# Patient Record
Sex: Female | Born: 1940 | Race: White | Hispanic: No | Smoking: Former smoker
Health system: Southern US, Community
[De-identification: ages and names within clinical notes are randomized; demographics above are authoritative.]

## PROBLEM LIST (undated history)

## (undated) DIAGNOSIS — Z8781 Personal history of (healed) traumatic fracture: Secondary | ICD-10-CM

## (undated) DIAGNOSIS — K219 Gastro-esophageal reflux disease without esophagitis: Secondary | ICD-10-CM

## (undated) DIAGNOSIS — E119 Type 2 diabetes mellitus without complications: Secondary | ICD-10-CM

## (undated) DIAGNOSIS — E785 Hyperlipidemia, unspecified: Secondary | ICD-10-CM

## (undated) DIAGNOSIS — I10 Essential (primary) hypertension: Secondary | ICD-10-CM

## (undated) DIAGNOSIS — S270XXA Traumatic pneumothorax, initial encounter: Secondary | ICD-10-CM

## (undated) HISTORY — DX: Hyperlipidemia, unspecified: E78.5

## (undated) HISTORY — DX: Gastro-esophageal reflux disease without esophagitis: K21.9

## (undated) HISTORY — PX: TONSILLECTOMY: SUR1361

## (undated) HISTORY — DX: Essential (primary) hypertension: I10

## (undated) HISTORY — PX: COLONOSCOPY: SHX174

## (undated) HISTORY — DX: Type 2 diabetes mellitus without complications: E11.9

## (undated) HISTORY — DX: Traumatic pneumothorax, initial encounter: S27.0XXA

---

## 1991-12-31 DIAGNOSIS — S270XXA Traumatic pneumothorax, initial encounter: Secondary | ICD-10-CM

## 1991-12-31 HISTORY — DX: Traumatic pneumothorax, initial encounter: S27.0XXA

## 1998-07-31 ENCOUNTER — Other Ambulatory Visit: Admission: RE | Admit: 1998-07-31 | Discharge: 1998-07-31 | Payer: Self-pay | Admitting: Obstetrics and Gynecology

## 1999-02-24 ENCOUNTER — Emergency Department (HOSPITAL_COMMUNITY): Admission: EM | Admit: 1999-02-24 | Discharge: 1999-02-24 | Payer: Self-pay

## 1999-08-08 ENCOUNTER — Other Ambulatory Visit: Admission: RE | Admit: 1999-08-08 | Discharge: 1999-08-08 | Payer: Self-pay | Admitting: Obstetrics and Gynecology

## 2000-09-19 ENCOUNTER — Other Ambulatory Visit: Admission: RE | Admit: 2000-09-19 | Discharge: 2000-09-19 | Payer: Self-pay | Admitting: Obstetrics and Gynecology

## 2000-12-08 ENCOUNTER — Other Ambulatory Visit: Admission: RE | Admit: 2000-12-08 | Discharge: 2000-12-08 | Payer: Self-pay | Admitting: Obstetrics and Gynecology

## 2000-12-08 ENCOUNTER — Encounter (INDEPENDENT_AMBULATORY_CARE_PROVIDER_SITE_OTHER): Payer: Self-pay | Admitting: Specialist

## 2001-10-07 ENCOUNTER — Other Ambulatory Visit: Admission: RE | Admit: 2001-10-07 | Discharge: 2001-10-07 | Payer: Self-pay | Admitting: Obstetrics and Gynecology

## 2002-12-29 ENCOUNTER — Other Ambulatory Visit: Admission: RE | Admit: 2002-12-29 | Discharge: 2002-12-29 | Payer: Self-pay | Admitting: Obstetrics and Gynecology

## 2003-05-27 ENCOUNTER — Emergency Department (HOSPITAL_COMMUNITY): Admission: EM | Admit: 2003-05-27 | Discharge: 2003-05-27 | Payer: Self-pay | Admitting: Emergency Medicine

## 2003-05-30 ENCOUNTER — Emergency Department (HOSPITAL_COMMUNITY): Admission: EM | Admit: 2003-05-30 | Discharge: 2003-05-30 | Payer: Self-pay | Admitting: Emergency Medicine

## 2003-06-03 ENCOUNTER — Encounter (HOSPITAL_COMMUNITY): Admission: RE | Admit: 2003-06-03 | Discharge: 2003-09-01 | Payer: Self-pay | Admitting: Emergency Medicine

## 2004-01-31 ENCOUNTER — Other Ambulatory Visit: Admission: RE | Admit: 2004-01-31 | Discharge: 2004-01-31 | Payer: Self-pay | Admitting: Obstetrics and Gynecology

## 2004-11-14 ENCOUNTER — Ambulatory Visit: Payer: Self-pay | Admitting: Internal Medicine

## 2005-04-25 ENCOUNTER — Other Ambulatory Visit: Admission: RE | Admit: 2005-04-25 | Discharge: 2005-04-25 | Payer: Self-pay | Admitting: Obstetrics and Gynecology

## 2005-05-03 ENCOUNTER — Ambulatory Visit: Payer: Self-pay | Admitting: Internal Medicine

## 2005-07-10 ENCOUNTER — Ambulatory Visit: Payer: Self-pay | Admitting: Internal Medicine

## 2005-07-30 ENCOUNTER — Ambulatory Visit: Payer: Self-pay | Admitting: Internal Medicine

## 2005-10-04 ENCOUNTER — Ambulatory Visit: Payer: Self-pay | Admitting: Family Medicine

## 2006-04-14 ENCOUNTER — Ambulatory Visit: Payer: Self-pay | Admitting: Internal Medicine

## 2006-05-08 ENCOUNTER — Ambulatory Visit: Payer: Self-pay | Admitting: Internal Medicine

## 2006-08-07 ENCOUNTER — Ambulatory Visit: Payer: Self-pay | Admitting: Internal Medicine

## 2006-08-13 ENCOUNTER — Ambulatory Visit: Payer: Self-pay | Admitting: Internal Medicine

## 2006-09-23 ENCOUNTER — Ambulatory Visit: Payer: Self-pay | Admitting: Internal Medicine

## 2006-10-08 ENCOUNTER — Ambulatory Visit: Payer: Self-pay | Admitting: Internal Medicine

## 2006-11-05 ENCOUNTER — Ambulatory Visit: Payer: Self-pay | Admitting: Internal Medicine

## 2007-01-30 ENCOUNTER — Ambulatory Visit: Payer: Self-pay | Admitting: Pulmonary Disease

## 2007-02-28 HISTORY — PX: DILATION AND CURETTAGE OF UTERUS: SHX78

## 2007-03-19 ENCOUNTER — Ambulatory Visit: Payer: Self-pay | Admitting: Internal Medicine

## 2007-03-25 ENCOUNTER — Encounter (INDEPENDENT_AMBULATORY_CARE_PROVIDER_SITE_OTHER): Payer: Self-pay | Admitting: *Deleted

## 2007-03-25 ENCOUNTER — Ambulatory Visit (HOSPITAL_COMMUNITY): Admission: RE | Admit: 2007-03-25 | Discharge: 2007-03-25 | Payer: Self-pay | Admitting: Obstetrics and Gynecology

## 2007-05-27 DIAGNOSIS — J3089 Other allergic rhinitis: Secondary | ICD-10-CM

## 2007-05-27 DIAGNOSIS — R7303 Prediabetes: Secondary | ICD-10-CM | POA: Insufficient documentation

## 2007-05-27 DIAGNOSIS — J302 Other seasonal allergic rhinitis: Secondary | ICD-10-CM

## 2007-05-27 DIAGNOSIS — J4520 Mild intermittent asthma, uncomplicated: Secondary | ICD-10-CM

## 2007-06-25 ENCOUNTER — Ambulatory Visit: Payer: Self-pay | Admitting: Internal Medicine

## 2007-06-25 DIAGNOSIS — K219 Gastro-esophageal reflux disease without esophagitis: Secondary | ICD-10-CM

## 2007-07-02 ENCOUNTER — Encounter (INDEPENDENT_AMBULATORY_CARE_PROVIDER_SITE_OTHER): Payer: Self-pay | Admitting: *Deleted

## 2007-08-29 ENCOUNTER — Observation Stay (HOSPITAL_COMMUNITY): Admission: EM | Admit: 2007-08-29 | Discharge: 2007-09-01 | Payer: Self-pay | Admitting: Emergency Medicine

## 2007-09-01 ENCOUNTER — Ambulatory Visit: Payer: Self-pay | Admitting: Physical Medicine & Rehabilitation

## 2007-09-14 ENCOUNTER — Ambulatory Visit: Payer: Self-pay | Admitting: Internal Medicine

## 2007-09-28 ENCOUNTER — Encounter: Payer: Self-pay | Admitting: Cardiology

## 2007-10-19 ENCOUNTER — Telehealth: Payer: Self-pay | Admitting: Internal Medicine

## 2007-10-29 ENCOUNTER — Ambulatory Visit: Payer: Self-pay | Admitting: Internal Medicine

## 2007-10-29 LAB — CONVERTED CEMR LAB
Hgb A1c MFr Bld: 5.7 % (ref 4.6–6.0)
Total CHOL/HDL Ratio: 3.4
VLDL: 36 mg/dL (ref 0–40)

## 2007-11-02 ENCOUNTER — Ambulatory Visit: Payer: Self-pay | Admitting: Internal Medicine

## 2007-11-02 DIAGNOSIS — E785 Hyperlipidemia, unspecified: Secondary | ICD-10-CM | POA: Insufficient documentation

## 2007-11-02 LAB — CONVERTED CEMR LAB: Cholesterol, target level: 200 mg/dL

## 2007-11-03 ENCOUNTER — Ambulatory Visit: Payer: Self-pay | Admitting: Internal Medicine

## 2008-03-07 ENCOUNTER — Encounter (INDEPENDENT_AMBULATORY_CARE_PROVIDER_SITE_OTHER): Payer: Self-pay | Admitting: *Deleted

## 2008-03-07 ENCOUNTER — Ambulatory Visit: Payer: Self-pay | Admitting: Internal Medicine

## 2008-03-18 ENCOUNTER — Telehealth (INDEPENDENT_AMBULATORY_CARE_PROVIDER_SITE_OTHER): Payer: Self-pay | Admitting: *Deleted

## 2008-03-21 ENCOUNTER — Telehealth (INDEPENDENT_AMBULATORY_CARE_PROVIDER_SITE_OTHER): Payer: Self-pay | Admitting: *Deleted

## 2008-04-12 ENCOUNTER — Ambulatory Visit: Payer: Self-pay | Admitting: Gastroenterology

## 2008-04-27 ENCOUNTER — Ambulatory Visit: Payer: Self-pay | Admitting: Gastroenterology

## 2008-04-27 ENCOUNTER — Encounter: Payer: Self-pay | Admitting: Internal Medicine

## 2008-04-27 ENCOUNTER — Encounter: Payer: Self-pay | Admitting: Gastroenterology

## 2008-05-18 ENCOUNTER — Ambulatory Visit: Payer: Self-pay | Admitting: Internal Medicine

## 2008-06-15 ENCOUNTER — Ambulatory Visit: Payer: Self-pay | Admitting: Internal Medicine

## 2008-06-15 LAB — CONVERTED CEMR LAB
ALT: 18 units/L (ref 0–35)
Albumin: 3.6 g/dL (ref 3.5–5.2)
BUN: 24 mg/dL — ABNORMAL HIGH (ref 6–23)
Basophils Relative: 0 % (ref 0.0–1.0)
CO2: 27 meq/L (ref 19–32)
Calcium: 9.3 mg/dL (ref 8.4–10.5)
Cholesterol: 177 mg/dL (ref 0–200)
Eosinophils Relative: 4 % (ref 0.0–5.0)
GFR calc Af Amer: 81 mL/min
Glucose, Bld: 99 mg/dL (ref 70–99)
HCT: 40.9 % (ref 36.0–46.0)
Hemoglobin: 14 g/dL (ref 12.0–15.0)
Monocytes Absolute: 0.5 10*3/uL (ref 0.1–1.0)
Monocytes Relative: 7.7 % (ref 3.0–12.0)
Neutro Abs: 3.9 10*3/uL (ref 1.4–7.7)
RBC: 4.52 M/uL (ref 3.87–5.11)
TSH: 6.72 microintl units/mL — ABNORMAL HIGH (ref 0.35–5.50)
Total CHOL/HDL Ratio: 3.4
Total Protein: 6.3 g/dL (ref 6.0–8.3)
WBC: 6.2 10*3/uL (ref 4.5–10.5)

## 2008-06-24 ENCOUNTER — Ambulatory Visit: Payer: Self-pay | Admitting: Internal Medicine

## 2008-06-24 DIAGNOSIS — I10 Essential (primary) hypertension: Secondary | ICD-10-CM

## 2008-06-24 DIAGNOSIS — E039 Hypothyroidism, unspecified: Secondary | ICD-10-CM

## 2008-07-07 ENCOUNTER — Telehealth (INDEPENDENT_AMBULATORY_CARE_PROVIDER_SITE_OTHER): Payer: Self-pay | Admitting: *Deleted

## 2008-09-26 ENCOUNTER — Ambulatory Visit: Payer: Self-pay | Admitting: Internal Medicine

## 2008-10-03 ENCOUNTER — Ambulatory Visit: Payer: Self-pay | Admitting: Internal Medicine

## 2008-12-08 ENCOUNTER — Encounter: Payer: Self-pay | Admitting: Internal Medicine

## 2009-01-03 ENCOUNTER — Ambulatory Visit: Payer: Self-pay | Admitting: Internal Medicine

## 2009-01-08 LAB — CONVERTED CEMR LAB: TSH: 1.35 microintl units/mL (ref 0.35–5.50)

## 2009-01-09 ENCOUNTER — Encounter (INDEPENDENT_AMBULATORY_CARE_PROVIDER_SITE_OTHER): Payer: Self-pay | Admitting: *Deleted

## 2009-01-10 ENCOUNTER — Ambulatory Visit: Payer: Self-pay | Admitting: Internal Medicine

## 2009-01-12 ENCOUNTER — Ambulatory Visit: Payer: Self-pay | Admitting: Internal Medicine

## 2009-01-12 DIAGNOSIS — L501 Idiopathic urticaria: Secondary | ICD-10-CM

## 2009-03-08 ENCOUNTER — Telehealth: Payer: Self-pay | Admitting: Internal Medicine

## 2009-07-05 ENCOUNTER — Ambulatory Visit: Payer: Self-pay | Admitting: Internal Medicine

## 2009-07-11 ENCOUNTER — Encounter (INDEPENDENT_AMBULATORY_CARE_PROVIDER_SITE_OTHER): Payer: Self-pay | Admitting: *Deleted

## 2009-07-13 ENCOUNTER — Ambulatory Visit: Payer: Self-pay | Admitting: Internal Medicine

## 2009-07-14 ENCOUNTER — Encounter: Payer: Self-pay | Admitting: Internal Medicine

## 2009-07-18 ENCOUNTER — Ambulatory Visit: Payer: Self-pay | Admitting: Internal Medicine

## 2009-07-23 LAB — CONVERTED CEMR LAB
ALT: 22 units/L (ref 0–35)
AST: 23 units/L (ref 0–37)
Alkaline Phosphatase: 69 units/L (ref 39–117)
BUN: 19 mg/dL (ref 6–23)
Basophils Relative: 0.4 % (ref 0.0–3.0)
Chloride: 107 meq/L (ref 96–112)
Cholesterol: 186 mg/dL (ref 0–200)
Eosinophils Relative: 2.9 % (ref 0.0–5.0)
GFR calc non Af Amer: 59.02 mL/min (ref >60)
Glucose, Bld: 91 mg/dL (ref 70–99)
Hemoglobin: 14 g/dL (ref 12.0–15.0)
LDL Cholesterol: 87 mg/dL (ref 0–99)
Lymphocytes Relative: 24.8 % (ref 12.0–46.0)
MCHC: 34 g/dL (ref 30.0–36.0)
Monocytes Relative: 6.3 % (ref 3.0–12.0)
Neutro Abs: 4.4 10*3/uL (ref 1.4–7.7)
Neutrophils Relative %: 65.6 % (ref 43.0–77.0)
Potassium: 3.7 meq/L (ref 3.5–5.1)
RBC: 4.57 M/uL (ref 3.87–5.11)
Sodium: 143 meq/L (ref 135–145)
Total Bilirubin: 0.7 mg/dL (ref 0.3–1.2)
VLDL: 38.8 mg/dL (ref 0.0–40.0)
WBC: 6.7 10*3/uL (ref 4.5–10.5)

## 2009-07-24 ENCOUNTER — Encounter (INDEPENDENT_AMBULATORY_CARE_PROVIDER_SITE_OTHER): Payer: Self-pay | Admitting: *Deleted

## 2009-10-24 ENCOUNTER — Ambulatory Visit: Payer: Self-pay | Admitting: Internal Medicine

## 2009-12-11 ENCOUNTER — Encounter: Payer: Self-pay | Admitting: Internal Medicine

## 2010-01-04 ENCOUNTER — Ambulatory Visit: Payer: Self-pay | Admitting: Internal Medicine

## 2010-01-04 ENCOUNTER — Encounter (INDEPENDENT_AMBULATORY_CARE_PROVIDER_SITE_OTHER): Payer: Self-pay | Admitting: *Deleted

## 2010-01-04 LAB — CONVERTED CEMR LAB
OCCULT 1: NEGATIVE
OCCULT 2: NEGATIVE
OCCULT 3: NEGATIVE

## 2010-01-11 ENCOUNTER — Ambulatory Visit: Payer: Self-pay | Admitting: Internal Medicine

## 2010-04-24 ENCOUNTER — Ambulatory Visit: Payer: Self-pay | Admitting: Internal Medicine

## 2010-07-19 ENCOUNTER — Ambulatory Visit: Payer: Self-pay | Admitting: Internal Medicine

## 2010-09-21 ENCOUNTER — Ambulatory Visit: Payer: Self-pay | Admitting: Internal Medicine

## 2010-10-01 ENCOUNTER — Telehealth (INDEPENDENT_AMBULATORY_CARE_PROVIDER_SITE_OTHER): Payer: Self-pay | Admitting: *Deleted

## 2010-12-12 ENCOUNTER — Encounter: Payer: Self-pay | Admitting: Internal Medicine

## 2011-01-07 ENCOUNTER — Telehealth: Payer: Self-pay | Admitting: Internal Medicine

## 2011-01-09 ENCOUNTER — Encounter: Payer: Self-pay | Admitting: Internal Medicine

## 2011-01-10 ENCOUNTER — Ambulatory Visit
Admission: RE | Admit: 2011-01-10 | Discharge: 2011-01-10 | Payer: Self-pay | Source: Home / Self Care | Attending: Internal Medicine | Admitting: Internal Medicine

## 2011-01-14 ENCOUNTER — Ambulatory Visit
Admission: RE | Admit: 2011-01-14 | Discharge: 2011-01-14 | Payer: Self-pay | Source: Home / Self Care | Attending: Internal Medicine | Admitting: Internal Medicine

## 2011-01-14 LAB — CONVERTED CEMR LAB
OCCULT 1: NEGATIVE
OCCULT 2: NEGATIVE
OCCULT 3: NEGATIVE

## 2011-01-15 ENCOUNTER — Encounter (INDEPENDENT_AMBULATORY_CARE_PROVIDER_SITE_OTHER): Payer: Self-pay | Admitting: *Deleted

## 2011-01-18 ENCOUNTER — Ambulatory Visit: Admit: 2011-01-18 | Payer: Self-pay | Admitting: Internal Medicine

## 2011-01-27 LAB — CONVERTED CEMR LAB
ALT: 26 units/L (ref 0–35)
Albumin: 4.1 g/dL (ref 3.5–5.2)
Alkaline Phosphatase: 79 units/L (ref 39–117)
BUN: 21 mg/dL (ref 6–23)
Basophils Absolute: 0.1 10*3/uL (ref 0.0–0.1)
Basophils Relative: 0.1 % (ref 0.0–1.0)
Bilirubin, Direct: 0.1 mg/dL (ref 0.0–0.3)
CO2: 29 meq/L (ref 19–32)
CO2: 29 meq/L (ref 19–32)
Calcium: 9.6 mg/dL (ref 8.4–10.5)
Calcium: 9.9 mg/dL (ref 8.4–10.5)
Cholesterol, target level: 200 mg/dL
Cholesterol: 215 mg/dL — ABNORMAL HIGH (ref 0–200)
Eosinophils Absolute: 0.2 10*3/uL (ref 0.0–0.7)
Eosinophils Relative: 5 % (ref 0.0–5.0)
GFR calc Af Amer: 81 mL/min
GFR calc non Af Amer: 61.67 mL/min (ref >60)
Glucose, Bld: 106 mg/dL — ABNORMAL HIGH (ref 70–99)
Glucose, Bld: 95 mg/dL (ref 70–99)
HCT: 40.9 % (ref 36.0–46.0)
HDL: 60.5 mg/dL (ref >39.00)
Hemoglobin: 13.7 g/dL (ref 12.0–15.0)
Hemoglobin: 14.5 g/dL (ref 12.0–15.0)
Lymphocytes Relative: 18.3 % (ref 12.0–46.0)
Lymphocytes Relative: 22.2 % (ref 12.0–46.0)
Lymphs Abs: 1.3 10*3/uL (ref 0.7–4.0)
MCHC: 33.7 g/dL (ref 30.0–36.0)
Monocytes Absolute: 0.5 10*3/uL (ref 0.2–0.7)
Monocytes Relative: 8.2 % (ref 3.0–12.0)
Neutro Abs: 3.8 10*3/uL (ref 1.4–7.7)
Neutro Abs: 4.9 10*3/uL (ref 1.4–7.7)
Platelets: 390 10*3/uL (ref 150.0–400.0)
Potassium: 4.4 meq/L (ref 3.5–5.1)
RDW: 13.5 % (ref 11.5–14.6)
Sodium: 145 meq/L (ref 135–145)
TSH: 0.6 microintl units/mL (ref 0.35–5.50)
TSH: 3.52 microintl units/mL (ref 0.35–5.50)
Total Bilirubin: 0.5 mg/dL (ref 0.3–1.2)
Total Protein: 6.6 g/dL (ref 6.0–8.3)
Triglycerides: 260 mg/dL — ABNORMAL HIGH (ref 0.0–149.0)
VLDL: 49 mg/dL — ABNORMAL HIGH (ref 0–40)
WBC: 7.1 10*3/uL (ref 4.5–10.5)

## 2011-01-31 NOTE — Letter (Signed)
Summary: Results Follow up Letter  [facility]  [address] [place], [place] [address]   Phone: [phone]  Fax: [phone]    01/04/2010 MRN: [id number]  Natalie Shannon [address] [place], [place]  [address]  Dear Ms. Dukes,  The following are the results of your recent test(s):  Test         Result    Pap Smear:        Normal _____  Not Normal _____ Comments: ______________________________________________________ Cholesterol: LDL(Bad cholesterol):         Your goal is less than:         HDL (Good cholesterol):       Your goal is more than: Comments:  ______________________________________________________ Mammogram:        Normal _____  Not Normal _____ Comments:  ___________________________________________________________________ Hemoccult:        Normal __X___  Not normal _______ Comments:    _____________________________________________________________________ Other Tests:    We routinely do not discuss normal results over the telephone.  If you desire a copy of the results, or you have any questions about this information we can discuss them at your next office visit.   Sincerely,

## 2011-01-31 NOTE — Progress Notes (Signed)
Summary: prilosec and singulair refill   Phone Note Refill Request Message from:  Fax from Pharmacy on October 01, 2010 1:45 PM  Refills Requested: Medication #1:  PRILOSEC 20 MG  CPDR 1 by mouth once daily  Medication #2:  SINGULAIR 10 MG  TABS 1 by mouth qd [facility] - fax [phone]  Initial call taken by: Okey Regal [place],  October 01, 2010 1:46 PM    Prescriptions: SINGULAIR 10 MG  TABS (MONTELUKAST SODIUM) 1 by mouth qd  #90 x 3   Entered by:   Doristine Devoid CMA   Authorized by:   Marga Melnick MD   Signed by:   Doristine Devoid CMA on 10/02/2010   Method used:   Electronically to        [facility] (retail)       [address]       [place], [place]  [address]       Ph: [phone]       Fax: [phone]   RxID:   [id number] PRILOSEC 20 MG  CPDR (OMEPRAZOLE) 1 by mouth once daily  #90 x 3   Entered by:   Doristine Devoid CMA   Authorized by:   Marga Melnick MD   Signed by:   Doristine Devoid CMA on 10/02/2010   Method used:   Electronically to        [facility] (retail)       [address]       [place], [place]  [address]       Ph: [phone]       Fax: [phone]   RxID:   [id number]
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
## 2011-01-31 NOTE — Assessment & Plan Note (Signed)
Summary: flu shot/cbs  Flu Vaccine Consent Questions     Do you have a history of severe allergic reactions to this vaccine? no    Any prior history of allergic reactions to egg and/or gelatin? no    Do you have a sensitivity to the preservative Thimersol? no    Do you have a past history of Guillan-Barre Syndrome? no    Do you currently have an acute febrile illness? no    Have you ever had a severe reaction to latex? no    Vaccine information given and explained to patient? yes    Are you currently pregnant? no    Lot Number:[id number]   Exp Date:06/29/2011   Manufacturer: [employer]    Site Given  Left Deltoid IM  Nurse Visit   Allergies: 1)  ! Sulfa 2)  ! * Latex 3)  ! * Adhesive Tape 4)  ! Betadine  Orders Added: 1)  Flu Vaccine 85yrs + MEDICARE PATIENTS [Q2039] 2)  Administration Flu vaccine - MCR [G0008]
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
## 2011-01-31 NOTE — Letter (Signed)
Summary: Results Follow up Letter  [facility] [place]  [address] [place], [place] [address]   Phone: [phone]  Fax: [phone]    01/15/2011 MRN: [id number]  Natalie Shannon [address] [place], [place]  [address]  Dear [name],  The following are the results of your recent test(s):  Test         Result    Pap Smear:        Normal _____  Not Normal _____ Comments: ______________________________________________________ Cholesterol: LDL(Bad cholesterol):         Your goal is less than:         HDL (Good cholesterol):       Your goal is more than: Comments:  ______________________________________________________ Mammogram:        Normal _____  Not Normal _____ Comments:  ___________________________________________________________________ Hemoccult:        Normal _X____  Not normal _______ Comments:    _____________________________________________________________________ Other Tests:    We routinely do not discuss normal results over the telephone.  If you desire a copy of the results, or you have any questions about this information we can discuss them at your next office visit.   Sincerely,

## 2011-01-31 NOTE — Medication Information (Signed)
Summary: PA and Approval for Omeprazole  PA and Approval for Omeprazole   Imported By: Maryln Gottron 01/23/2011 15:36:45  _____________________________________________________________________  External Attachment:    Type:   Image     Comment:   External Document
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
## 2011-01-31 NOTE — Progress Notes (Signed)
Summary: Prior Auth  Phone Note Refill Request Call back at [phone] Message from:  Pharmacy on January 07, 2011 8:50 AM  Refills Requested: Medication #1:  PRILOSEC 20 MG  CPDR 1 by mouth once daily   Dosage confirmed as above?Dosage Confirmed Prior Auth from [facility] on [address]   Initial call taken by: Harold Barban,  January 07, 2011 8:51 AM  Follow-up for Phone Call        forms requested. Lucious Groves CMA  January 07, 2011 10:06 AM   Forms completed and faxed, will await insurance company reply. Lucious Groves CMA  January 09, 2011 2:56 PM      Appended Document: Prior Auth Approved until 12/2011.

## 2011-02-08 ENCOUNTER — Other Ambulatory Visit (INDEPENDENT_AMBULATORY_CARE_PROVIDER_SITE_OTHER): Payer: Medicare Other

## 2011-02-08 ENCOUNTER — Encounter (INDEPENDENT_AMBULATORY_CARE_PROVIDER_SITE_OTHER): Payer: Self-pay | Admitting: *Deleted

## 2011-02-08 ENCOUNTER — Other Ambulatory Visit: Payer: Self-pay | Admitting: Internal Medicine

## 2011-02-08 DIAGNOSIS — E782 Mixed hyperlipidemia: Secondary | ICD-10-CM

## 2011-02-08 DIAGNOSIS — R7309 Other abnormal glucose: Secondary | ICD-10-CM

## 2011-02-08 LAB — LIPID PANEL: Total CHOL/HDL Ratio: 3

## 2011-02-08 LAB — HEMOGLOBIN A1C: Hgb A1c MFr Bld: 6.1 % (ref 4.6–6.5)

## 2011-02-21 ENCOUNTER — Ambulatory Visit (INDEPENDENT_AMBULATORY_CARE_PROVIDER_SITE_OTHER): Payer: Medicare Other

## 2011-02-21 DIAGNOSIS — J301 Allergic rhinitis due to pollen: Secondary | ICD-10-CM

## 2011-05-06 ENCOUNTER — Telehealth: Payer: Self-pay | Admitting: Internal Medicine

## 2011-05-06 NOTE — Telephone Encounter (Signed)
Patient has cpx scheduled [id number] - lab [phone] - need lab order

## 2011-05-07 NOTE — Telephone Encounter (Signed)
Lipid,Hep,BMP,CBCD,TSH,A1c,Stool Cards 401.9, 272.2, 244.9,790.29  **PLEASE make sure patient aware to check with Insurance company to verify labs covered prior to CPX visit**

## 2011-05-09 NOTE — Telephone Encounter (Signed)
Lab order added to appt - msg left for patient to check ins for coverage

## 2011-05-14 NOTE — Consult Note (Signed)
Natalie Shannon, Natalie Shannon              ACCOUNT NO.:  [id number]   MEDICAL RECORD NO.:  [id number]          PATIENT TYPE:  OBV   LOCATION:  5005                         FACILITY:  [facility]   PHYSICIAN:  John L. Rendall, M.D.  DATE OF BIRTH:  [birth year]   DATE OF CONSULTATION:  DATE OF DISCHARGE:                                 CONSULTATION   CHIEF COMPLAINT:  Spinal fracture T-spine transverse processes.   PRESENT ILLNESS:  This 70 year old white female fell in her riding ring  as her horse shied from shadows, stumbled, loss footing and rolled.  She  apparently landed in the right shoulder region and was transported to  [facility].  She is otherwise in reasonably good health and has been  a horse rider for a good time.  She takes Synthroid.   ALLERGIES:  LASIX, BETADINE and SULFA.   PATIENT INFORMATION:  Patient information sheet and trauma history and  physical are reviewed.  Her vital signs are stable.  She is alert,  oriented and pleasant at 9:00 a.m. this morning.  She has good range of  motion of both arms, normal sensation.  No pain in the arms.  She was  right paraspinous pain and spasm from upper shoulder blade to at least  brow level.  Left side not painful, lower ribs nontender.  Pelvis  nontender.  Good range of motion hips, knees and ankles with normal  sensation and no muscle deficits apparent.  X-rays were reviewed.  The  CT scan shows transverse process fractures of T5-T9 on right only, all  other CTs appeared negative to the radiologist.   IMPRESSION:  The right T5-T9 transverse process fractures.   PLAN:  Pain control, mobilize with PT help, no brace needed.  Recheck in  the office in about 2 weeks.  Okay for release home when she can  mobilize independently.  I will sign off at this point as no active  orthopedic care is needed other than these supportive items.      John L. Rendall, M.D.  Electronically Signed     JLR/MEDQ  D:  08/29/2007  T:  08/30/2007   Job:  [id number]

## 2011-05-14 NOTE — Discharge Summary (Signed)
NAMECARMISHA, Natalie Shannon              ACCOUNT NO.:  [id number]   MEDICAL RECORD NO.:  [id number]          PATIENT TYPE:  INP   LOCATION:  5005                         FACILITY:  [facility]   PHYSICIAN:  Gabrielle Dare. Janee Morn, M.D.DATE OF BIRTH:  27-Apr-1941   DATE OF ADMISSION:  08/28/2007  DATE OF DISCHARGE:  09/01/2007                               DISCHARGE SUMMARY   DISCHARGE DIAGNOSES:  1. Status post fall from a horse.  2. Spinous process and fractures and transverse process fractures of      T5-T9.  3. Contusions.  4. History of previous trauma recently with a fifth metacarpal      fracture, left hand.  5. History of previous rib fractures and pneumothorax.  6. Hypothyroidism.  7. Asthma.  8. Hyperlipidemia.  9. Hypertension   ADMITTING TRAUMA SURGEON:  Ardeth Sportsman, M.D.   CONSULTANTS:  Carlisle Beers. Rendall, M.D.   HISTORY:  This is a 70 year old female who apparently fell off her  horse.  She landed on her back and was complaining of severe back pain  radiating down into her legs.  She was brought to the emergency room and  full workup was done including CT scan of her C-spine which was  negative, CT scan of her abdomen and pelvis which was negative except  for some minimal transverse process fractures T5-T9.  The patient was  admitted for pain control and mobilization.  She was seen by Dr. Priscille Kluver  in orthopedic consultation and it was recommended that she be mobilized  with physical therapy.  It was felt no brace was needed.  She will  follow up in Dr. Sable Feil office in approximately 2 weeks.  She had a  lot of difficulty with nausea and vomiting with attempts to mobilize  secondary to complaints of pain.  Eventually we were able to control her  pain adequately and she is able to discharge home with home health PT/OT  and aide and equipment.  She does live alone.   DISCHARGE MEDICATIONS:  At this time she is discharged on:  1. Hydrocodone 5/325 one to two p.o. q.4 h p.r.n.  pain, #75 no refill      .  2. Phenergan 12.5 mg one tablet q.6 h p.r.n. nausea, #30 no refill.  3. She will continue her usual home meds including a baby aspirin      daily.  4. Avapro 150 mg daily.  5. Singulair 10 mg daily.  6. Synthroid 75 mcg daily x4.  7. Vytorin 10/40 daily.  8. Her usual inhalers for asthma.   FOLLOW UP:  She will need to follow up with Dr. Priscille Kluver as noted.  She  does not require any formal follow-up with trauma service and she will  see her usual medical doctor, Dr. Alwyn Ren per her usual schedule.      Shawn Rayburn, P.A.      Gabrielle Dare Janee Morn, M.D.  Electronically Signed    SR/MEDQ  D:  09/01/2007  T:  09/02/2007  Job:  [id number]   cc:   Jonny Ruiz L. Rendall, M.D.  Titus Dubin. Alwyn Ren, MD,FACP,FCCP

## 2011-05-14 NOTE — H&P (Signed)
Natalie Shannon, Natalie Shannon              ACCOUNT NO.:  [id number]   MEDICAL RECORD NO.:  [id number]          PATIENT TYPE:  OBV   LOCATION:  5005                         FACILITY:  [facility]   PHYSICIAN:  Ardeth Sportsman, MD     DATE OF BIRTH:  1941-03-17   DATE OF ADMISSION:  08/28/2007  DATE OF DISCHARGE:                              HISTORY & PHYSICAL   PRIMARY CARE PHYSICIAN:  Titus Dubin. Alwyn Ren, MD, FACP, [facility]   REQUESTING PHYSICIAN:  Hilario Quarry, M.D.   REASON FOR CONSULTATION:  Fall off horse with spinal fractures of  transverse processes.   HISTORY OF PRESENT ILLNESS:  Natalie Shannon is a 70 year old female who  was riding her horse this evening.  The horse bucked and she fell off  the horse around 9 p.m.  The horse did not fall on top of her, but was  able to steady itself.  She recalls the event fully with no loss of  consciousness.  She complained of severe back pain that was rather  strong even with lying still.  Basic concerns, a friend brought her in  for evaluation.  She denies really any abdominal pain.  She notes the  back pain starts between the shoulder blades and radiates all the way  down her spine.  She has soreness when she tries to lift her legs.  No  lightheadedness or dizziness at this point.   She did get very sedated after getting 2 mg of Dilaudid after  complaining that the prior IV medication is not giving her enough  relief.   PAST MEDICAL HISTORY:  1. Hypothyroidism.  2. Asthma.  3. Hyperlipidemia.   PAST SURGICAL HISTORY:  1. She had a tonsillectomy.  2. She has also had, sounds like prior trauma with broken ribs and      pneumothorax, that is all resolved.   MEDICATIONS:  She recalls:  1. AmiPro.  2. Singulair.  3. Synthroid.  4. Maybe epinephrine as well.  5. There is some history of her being on Vytorin and Nexium in the      past and Flonase in the past.  She does not have her home medicine      list with her.   ALLERGIES:  SHE SAYS SHE  IS NOT ALLERGIC TO NUMEROUS MEDICATIONS, BUT  SHE CAN RECALL BEING ALLERGIC TO LATEX AND BETADINE.  SULFA APPARENTLY  GETS HER VERY DISORIENTED AND DIZZY, BUT NO TRUE RASH.  MORPHINE MAKES  HER NAUSEATED, BUT IF SHE TAKES PHENERGAN WITH IT, IT SEEMS TO TOLERATE  WELL.   SOCIAL HISTORY:  No tobacco, alcohol, or other drug use.   FAMILY HISTORY:  Noncontributory for any major neurological or cardiac  disorders or any other major issues.   REVIEW OF SYSTEMS:  Noted per HPI, otherwise constitutional,  Ophthalmology, ENT, cardiac, respiratory, GI, GU, hepatic, renal, and  endocrine are otherwise negative.  MUSCULOSKELETAL:  Severe back pain is  noted per HPI, but otherwise negative for any other joint or other  muscle aches.  NEUROLOGICAL:  Negative except for as noted above.  Her  asthma  seems to be stable on her home medications.  No recent flares.   PHYSICAL EXAMINATION:  VITAL SIGNS:  Temperature is 97, pulse 74,  respirations 16, blood pressure of 144/87, 93% sats on room air.  GENERAL:  When I came into the room she was very sleepy, but after a  couple sternum rubs she woke up and was able to be oriented x4.  GCS was  15.  She seems to think the average Intelligence.  PSYCH:  She is posing interactive with no evidence of dementia,  neuropsychosis, or paranoia.  LYMPH:  No head, neck, axillary, or carinal lymphadenopathy.  EYES:  Pupils equal, round, reactive to light.  Extraocular movements  are intact.  Sclerae are not icteric or injected.  HEENT:  She is normocephalic, with no facial asymmetry.  Mucous  membranes are moist.  Oropharynx is clear.  Her tympanic membranes are  clear.  NECK:  She was already out of the C-collar when I saw her.  She had some  mild muscle soreness on her trapezius muscles, but no pain along her  spinous processes and she has full range of motion without any worsening  pain or tenderness.  Trachea is midline.  With her negative CT of the C-  spine, I  think that it is reasonable to keep her spine cleared.  CHEST:  Clear to auscultation bilaterally.  No wheezes, rales, or  rhonchi.  No pain on rub or sternum compression.  HEART:  Regular rate and rhythm.  No murmurs, gallops, or rubs.  ABDOMEN:  Obese, but very soft.  She has a small subcutaneous lump about  4 x 4 x 2 cm.  It is too superficial to really by a lipoma, it is not  consistent with a hernia.  She tells me at the area where the horse  formerly bit her and she has had that area for a numerous of years, has  not changed at all or every became infected.  No umbilical hernias.  GENITAL:  Normal external female genitalia with no blood.  RECTAL:  No obvious blood.  Normal sphincter tone.  PELVIS:  Appears to be stable.  BACK:  She does have tenderness along her mid thoracic spine, but has  lower lumbar and cervical spine does not seem particularly tender.  No  other injury on her back.  SKIN:  No obvious petechiae, purpura.  No other sores or lesions.   STUDY:  She has a CT of her C-spine which is negative.  She has a CT of  her chest and abdomen only, which reveal transverse process fractures of  T5 through T9 on the right, but no evidence of any instability,  impingement, stenosis, or other abnormalities.  No significant  ecchymosis or hematoma.  She has a little bit of compressive atelectasis  bilaterally, but is rather subtle in her posterior inferior lobes.  Solid organs in her spleen and abdomen appear to be normal.  I did not  have her pelvis evaluation at this time.   LABORATORY DATA:  Hemoglobin 15, the rest of her electrolytes are  unremarkable.   ASSESSMENT AND PLAN:  A 70 year old female with fall with transverse  process fractures of her mid thoracic spine with severe pain, fairly  controlled on narcotics requiring intravenous medication.  1. To start oral pain control and transition off intravenous pain      medicines as tolerated.  2. Orthopedic consult or  spine surgical consultation.  Dr. Priscille Kluver  will evaluate the patient in the morning.  His instinct seems to      apply that she should be able to heal these without any further      bracing or surgery, but I will defer until he has full evaluation.  3. No evidence of any other source of trauma at this time.  We will      followup.  4. Try and resume home medications, which she can get regular dosing's      known.  5. Physical therapy evaluation once spine is cleared.      Ardeth Sportsman, MD  Electronically Signed     SCG/MEDQ  D:  08/29/2007  T:  08/29/2007  Job:  [id number]   cc:   Titus Dubin. Alwyn Ren, MD,FACP,FCCP

## 2011-05-14 NOTE — Assessment & Plan Note (Signed)
[facility]                             PULMONARY OFFICE NOTE   Natalie Shannon, Natalie Shannon                     MRN:          [id number]  DATE:11/03/2007                            DOB:          [birth year]    PROBLEMS:  1. Allergic rhinitis.  2. Asthmatic bronchitis.  3. Urticaria.  4. Contact rash to tape.  5. Esophageal reflux.  6. Rosacea.   HISTORY:  Has done very well with asthma and allergic rhinitis.  Main  complaint is itching of eyes, by which she means both the lids and the  periorbital skin as well as the conjunctivae.  Optivar has not helped.  She is trying over-the-counter medicines and cool compresses now.  She  has recognized no relation to cosmetics, which she uses sparingly.   Medications are listed and reviewed.   OBJECTIVE:  Weight 161 pounds.  BP 100/62.  Pulse 72.  Room air  saturation 98%.  Conjunctivae are very clear with clear lacrimal fluid.  There is no  injection at the globes.  No periorbital rash, edema, or excoriation.  Nasal airway is unobstructed.  Speech quality normal.  LUNGS:  Clear and unlabored.   IMPRESSION:  1. Allergic rhinitis.  2. Allergic conjunctivitis.  3. Asthma, currently under good control.  4. Question dry skin.   PLAN:  1. Refilled meds with discussion.  2. Flu vaccine.  3. Pneumococcal vaccine.  4. Cromolyn ophthalmic solution, 4%, 1-2 drops in affected eye q.o.d.  5. Lacrilube ointment as directed.  6. Scheduled return, one year followup, earlier p.r.n.     Clinton D. Maple Hudson, MD, Tonny Bollman, FACP  Electronically Signed   CDY/MedQ  DD: 11/03/2007  DT: 11/04/2007  Job #: [id number]   cc:   Titus Dubin. Alwyn Ren, MD,FACP,FCCP

## 2011-05-17 NOTE — Op Note (Signed)
Natalie Shannon, BOEHME              ACCOUNT NO.:  [id number]   MEDICAL RECORD NO.:  [id number]          PATIENT TYPE:  AMB   LOCATION:  SDC                           FACILITY:  [facility]   PHYSICIAN:  Juluis Mire, M.D.   DATE OF BIRTH:  [birth year]   DATE OF PROCEDURE:  03/25/2007  DATE OF DISCHARGE:                               OPERATIVE REPORT   POSTOPERATIVE DIAGNOSIS:  Postmenopausal bleeding.   POSTOPERATIVE DIAGNOSIS:  Postmenopausal bleeding.   OPERATIVE PROCEDURE:  Hysteroscopy with dilation and curettage and  paracervical block.   SURGEON:  Juluis Mire, M.D.   ANESTHESIA:  Paracervical block and general anesthesia.   ESTIMATED BLOOD LOSS:  Minimal.   PACKS AND DRAINS:  None.   INTRAOPERATIVE BLOOD REPLACED:  None.   COMPLICATIONS:  None.   INDICATIONS:  Noted in history and physical.   PROCEDURE:  Patient was taken to OR, placed supine position.  After  satisfactory level of general anesthesia obtained, the patient was  placed in dorsal supine position using Allen stirrups.  Perineum, vagina  cleansed out with an antiseptic solution and draped sterile field.  She  had a very small vaginal opening.  Therefore we used two small Deavers  for retraction.  Cervix grasped single toothed tenaculum.  Paracervical  blocks ensued using 1% Nesacaine.  Uterus sounded to 8 cm.  Cervix  dilated to a size 27 Pratt dilator.  Nonoperative hysteroscope was  introduced and intrauterine cavity distended using sorbitol.  Visualization revealed a very atrophic unremarkable endometrium.  There  was no excrescences or lesions noted.  No signs of perforation.  This  point time we did endometrial curettings.  These were sent for  pathological review.  At this point time the retractors as well single  tenaculum removed.  The patient taken out of dorsal position.  Once  alert, transferred recovery room good condition.  Sponge, instrument,  needle count reported correct by circulating nurse  x2.      Juluis Mire, M.D.  Electronically Signed     JSM/MEDQ  D:  03/25/2007  T:  03/25/2007  Job:  [id number]

## 2011-05-17 NOTE — Assessment & Plan Note (Signed)
[facility]                             PULMONARY OFFICE NOTE   Natalie Shannon, Natalie Shannon                     MRN:          [id number]  DATE:01/30/2007                            DOB:          15-Sep-1941    HISTORY OF PRESENT ILLNESS:  The patient is a 70 year old white female  patient of Dr. Roxy Cedar who has a known history of allergic rhinitis,  asthma, and urticaria.  Presents for an acute office visit.  The patient  complains of a 1 week history of nasal congestion, sinus pressure,  productive cough, and fever with a T-max of 100.6.  The patient denies  any hemoptysis, chest pain, orthopnea, PND, or leg swelling.  The  patient did undergo surgery last week on her left fifth digit status  post injury.   PAST MEDICAL HISTORY:  Reviewed.   CURRENT MEDICATIONS:  Reviewed.   PHYSICAL EXAMINATION:  The patient is a pleasant female in no acute  distress, she is afebrile with stable vital signs.  O2 saturation is 97%  on room air.  Temperature is 99.0.  HEENT:  Nasal mucosa is erythematous.  Maxillary sinus tenderness.  Posterior pharynx is clear.  NECK:  Supple without adenopathy.  No JVD.  LUNG:  Sounds are clear.  CARDIAC:  Regular rate.  ABDOMEN:  Soft and nontender.  EXTREMITIES:  Warm without any calf tenderness, cyanosis, clubbing, or  edema.   IMPRESSION/PLAN:  Acute tracheal bronchitis and probable early  sinusitis.  The patient is to being Augmentin times 10 days.  Mucinex DM  twice daily.  The patient may use Tussionex #4 ounces 1 teaspoon every  12 hours as needed for cough.  The patient is to return back with Dr.  Maple Hudson as scheduled or sooner if needed.      Rubye Oaks, NP  Electronically Signed      Clinton D. Maple Hudson, MD, Tonny Bollman, FACP  Electronically Signed   TP/MedQ  DD: 02/03/2007  DT: 02/03/2007  Job #: [id number]

## 2011-05-17 NOTE — Assessment & Plan Note (Signed)
[facility]                               PULMONARY OFFICE NOTE   Natalie Shannon, Natalie Shannon                     MRN:          [id number]  DATE:08/07/2006                            DOB:          02/07/1941    PROBLEM LIST:  1. Allergic rhinitis.  2. Asthmatic bronchitis.  3. Urticaria.  4. Contact rash to tape.  5. Esophageal reflux.  6. Rosacea.   HISTORY:  Her rosacea involves her eyes, and she asks for a cheaper eye drop  than Optivar.  We discussed some options, including over-the-counter, which  she has already tried.  We reviewed some history including 2 anaphylactic  episodes in the remote past, and a pneumothorax with rib fractures.  She  continues to ride horses.  Her respiratory status is doing very well.  She  had seen a dermatologist about her skin.  We reviewed allergy vaccine, the  risks and benefit decisions, including the issues of administration outside  of a medical office, anaphylaxis, epinephrine and available choices.  She  has decided she wants to continue giving her own allergy vaccine (she had  been a Engineer, civil (consulting)) and agrees to follow safety precautions.  She reviewed and  signed our waiver, with discussion.   MEDICATIONS:  1. Avapro 150-300 mg.  2. Pulmicort.  3. Singulair.  4. Vytorin 10/20 mg.  5. Synthroid 0.075 mg.  6. Nexium 40 mg.  7. Evista 60 mg.  8. Aspirin.  9. Flonase.  10.Allergy vaccine.  11.Astelin.  12.Optivar.  13.EpiPen.   ALLERGIES:  Drug intolerant to TAPES, LATEX, SULFA, and she needs  hypoallergenic linen at the hospital.  I suggested she get a Medic Alert  bracelet about that.   OBJECTIVE:  VITAL SIGNS:  Weight 170 pounds, BP 132/74, pulse rate of 71,  room air saturation 96%.  GENERAL:  Allergy vaccine is at 1:10.  CHEST:  Lung fields are clear, breathing is unlabored with no cough or  wheeze.  CARDIAC:  Heart sounds are regular without murmur or gallop.  EYES:  Conjunctivae are clear, and  look normal today.  SKIN:  I cannot appreciate any significant rash on her cheeks.  No  adenopathy, no edema.   IMPRESSION:  Rhinitis and asthma components are stable.  She has had a  remote history of urticaria, and question whether her rosacea has been  confused for some episodes of urticaria.   PLAN:  1. Risk discussion as above.  2. Price check [employer] for comparison.  3. Schedule return 1 year, earlier p.r.n.  4. Suggested Medic Alert warning about hypoallergenic sheets.                                   Clinton D. Maple Hudson, MD, FCCP, FACP   CDY/MedQ  DD:  08/07/2006  DT:  08/08/2006  Job #:  [id number]   cc:   Titus Dubin. Alwyn Ren, MD, FCCP

## 2011-05-17 NOTE — H&P (Signed)
NAME:  Natalie Shannon, Natalie Shannon              ACCOUNT NO.:  [id number]   MEDICAL RECORD NO.:  [id number]          PATIENT TYPE:  AMB   LOCATION:                                FACILITY:  [facility]   PHYSICIAN:  Juluis Mire, M.D.   DATE OF BIRTH:  1941-09-26   DATE OF ADMISSION:  DATE OF DISCHARGE:                              HISTORY & PHYSICAL   The patient is a 70 year old postmenopausal patient who presents for  hysteroscopy.  She is a patient who has had trouble with postmenopausal  bleeding.  Been unable to do evaluation in the office.  She now presents  for hysteroscopy for evaluation.   ALLERGIES:  The patient is allergic to SULFA AND LATEX.   MEDICATIONS:  She is on Singulair, Nexium, Flonase, Vytorin, Evista,  Astelin spray.   PAST MEDICAL HISTORY:  She had usual childhood diseases.  No significant  sequelae.   PAST SURGICAL HISTORY:  She has had a previous tonsillectomy.  She had  broken ribs and a punctured lung.   PAST OB HISTORY:  There is no obstetrical history.   FAMILY HISTORY:  Family history is noncontributory.   SOCIAL HISTORY:  Social history is no tobacco, alcohol use.   REVIEW OF SYSTEMS:  Noncontributory.   PHYSICAL EXAMINATION:  VITAL SIGNS:  The patient is afebrile, stable  vital signs.  HEENT:  The patient normocephalic.  Pupils equal, round, reactive to  light and accommodation. Extraocular movements intact. Sclerae and  conjunctivae are clear.  Oropharynx clear.  NECK:  Without thyromegaly.  BREASTS:  No discrete masses.  LUNGS:  Lungs clear.  CARDIOVASCULAR:  Regular rhythm and rate without murmurs or gallops.  ABDOMEN:  Abdominal exam is benign.  No mass, organomegaly or  tenderness. PELVIC:  Normal external genitalia.  Vaginal mucosa is  clear, somewhat atrophic.  Cervix unremarkable.  Uterus normal size,  shape and contour.  Adnexa free of masses or tenderness.   IMPRESSION:  Postmenopausal bleeding.   PLAN:  The patient to undergo hysteroscopic  evaluation.  Risks of  surgery have been discussed, including the risk of infection, the risk  of hemorrhage that could require transfusion with the risk of AIDS or  hepatitis.  Risk of injury  to adjacent organs including bladder, bowel, ureters that could require  further exploratory surgery.  Risk of deep venous thrombosis and  pulmonary embolus.  Some complications could require hysterectomy.  The  patient does understand indications and risks.      Juluis Mire, M.D.  Electronically Signed     JSM/MEDQ  D:  03/25/2007  T:  03/25/2007  Job:  [id number]

## 2011-08-01 ENCOUNTER — Other Ambulatory Visit: Payer: Medicare Other

## 2011-08-08 ENCOUNTER — Encounter: Payer: Self-pay | Admitting: Internal Medicine

## 2011-08-08 ENCOUNTER — Ambulatory Visit (INDEPENDENT_AMBULATORY_CARE_PROVIDER_SITE_OTHER): Payer: Medicare Other | Admitting: Internal Medicine

## 2011-08-08 VITALS — BP 130/78 | HR 71 | Temp 97.4°F | Resp 12 | Ht 65.75 in | Wt 167.0 lb

## 2011-08-08 DIAGNOSIS — E039 Hypothyroidism, unspecified: Secondary | ICD-10-CM

## 2011-08-08 DIAGNOSIS — Z0000 Encounter for general adult medical examination without abnormal findings: Secondary | ICD-10-CM

## 2011-08-08 DIAGNOSIS — J45909 Unspecified asthma, uncomplicated: Secondary | ICD-10-CM

## 2011-08-08 DIAGNOSIS — E782 Mixed hyperlipidemia: Secondary | ICD-10-CM

## 2011-08-08 DIAGNOSIS — I10 Essential (primary) hypertension: Secondary | ICD-10-CM

## 2011-08-08 NOTE — Progress Notes (Signed)
Subjective:    Patient ID: Natalie Shannon, female    DOB: [birth year], 70 y.o.   MRN: [id number]  HPI Medicare Wellness Visit:  The following psychosocial & medical history were reviewed as required by Medicare.   Social history: caffeine: none , alcohol:  no ,  tobacco use : quit 1971  & exercise : walking & gym 7X/ week  30-60 min.   Home & personal  safety / fall risk: no issues, activities of daily living: no limitations , seatbelt use : yes , and smoke alarm employment : yes .  Power of Attorney/Living Will status : in place  Vision ( as recorded per Nurse) & Hearing  evaluation :  Ophth exam 12/11, early cataracts;wall chart read & whisper heard @ 6 ft. Orientation :oriented X 3 , memory & recall :good, spelling  testing: normal,and mood & affect : normal . Depression / anxiety: no Travel history : age 46 [place] , immunization status :up to date , transfusion history:  no, and preventive health surveillance ( colonoscopies, BMD , etc as per protocol/ [facility]): colonoscopy due 2019, Dental care:  Every 6 mos . Chart reviewed &  Updated. Active issues reviewed & addressed.       Review of Systems Patient reports no vision/ hearing  changes, adenopathy,fever, weight change,  persistant / recurrent hoarseness , swallowing issues, chest pain,palpitations,edema,persistant /recurrent cough, hemoptysis, dyspnea( rest/ exertional/paroxysmal nocturnal), gastrointestinal bleeding(melena, rectal bleeding), abdominal pain, significant heartburn,  bowel changes,GU symptoms(dysuria, hematuria,pyuria, incontinence), Gyn symptoms(abnormal  bleeding , pain),  syncope, focal weakness, memory loss,numbness & tingling, skin/hair /nail changes, or abnormal bruising or bleeding.  The air quality affects her asthma;but  she has not had to use her rescue inhaler at all.     Objective:   Physical Exam Gen.: Healthy and well-nourished in appearance. Alert, appropriate and cooperative throughout exam. Head:  Normocephalic without obvious abnormalities Eyes: No corneal or conjunctival inflammation noted. Pupils equal round reactive to light and accommodation. Fundal exam is benign without hemorrhages, exudate, papilledema. Extraocular motion intact. Ears: External  ear exam reveals no significant lesions or deformities. Canals clear .TMs normal.  Nose: External nasal exam reveals no deformity or inflammation. Nasal mucosa are pink and moist. No lesions or exudates noted. Septum minimally dislocated Mouth: Oral mucosa and oropharynx reveal no lesions or exudates. Teeth in good repair. Neck: No deformities, masses, or tenderness noted. Range of motion &. Thyroid normal. Lungs: Normal respiratory effort; chest expands symmetrically. Lungs are clear to auscultation without rales, wheezes, or increased work of breathing. Heart: Normal rate and rhythm. Normal S1 and S2. No gallop, click, or rub. S4 w/o  murmur. Abdomen: Bowel sounds normal; abdomen soft and nontender. No masses, organomegaly or hernias noted. Post traumatic lipoma upper abdomen Genitalia: Dr Arelia Sneddon   .                                                                                   Musculoskeletal/extremities: No deformity or scoliosis noted of  the thoracic or lumbar spine. No clubbing, cyanosis, edema, or deformity noted. Range of motion  normal .Tone & strength  normal.Joints normal. Nail health  good. Vascular: Carotid, radial  artery, dorsalis pedis and  posterior tibial pulses are full and equal. No bruits present. Neurologic: Alert and oriented x3. Deep tendon reflexes symmetrical and normal.          Skin: Intact without suspicious lesions or rashes. Lymph: No cervical, axillary  lymphadenopathy present. Psych: Mood and affect are normal. Normally interactive                                                                                        Assessment & Plan:  #1 Medicare Wellness Exam; criteria met ; data entered #2 Problem  List reviewed ; Assessment/ Recommendations made Plan: see Orders

## 2011-08-08 NOTE — Patient Instructions (Signed)
Preventive Health Care: Exercise  30-45  minutes a day, 3-4 days a week. Walking is especially valuable in preventing Osteoporosis. Eat a low-fat diet with lots of fruits and vegetables, up to 7-9 servings per day. Avoid obesity; your goal = waist less than 35 inches.Consume less than 30 grams of sugar per day from foods & drinks with High Fructose Corn Syrup as #1,2,3 or #4 on label. Please  schedule fasting Labs : BMET,Lipids, hepatic panel, CBC & dif, TSH( see Diagnoses for Codes).

## 2011-08-09 ENCOUNTER — Other Ambulatory Visit (INDEPENDENT_AMBULATORY_CARE_PROVIDER_SITE_OTHER): Payer: Medicare Other

## 2011-08-09 ENCOUNTER — Other Ambulatory Visit: Payer: Self-pay | Admitting: Internal Medicine

## 2011-08-09 DIAGNOSIS — E039 Hypothyroidism, unspecified: Secondary | ICD-10-CM

## 2011-08-09 DIAGNOSIS — E785 Hyperlipidemia, unspecified: Secondary | ICD-10-CM

## 2011-08-09 DIAGNOSIS — I10 Essential (primary) hypertension: Secondary | ICD-10-CM

## 2011-08-09 LAB — CBC WITH DIFFERENTIAL/PLATELET
Basophils Relative: 0.5 % (ref 0.0–3.0)
Eosinophils Absolute: 0.3 10*3/uL (ref 0.0–0.7)
Eosinophils Relative: 4.6 % (ref 0.0–5.0)
HCT: 42.8 % (ref 36.0–46.0)
Hemoglobin: 14.2 g/dL (ref 12.0–15.0)
Lymphs Abs: 1.7 10*3/uL (ref 0.7–4.0)
MCHC: 33.1 g/dL (ref 30.0–36.0)
MCV: 89.9 fl (ref 78.0–100.0)
Monocytes Absolute: 0.4 10*3/uL (ref 0.1–1.0)
Neutro Abs: 3.6 10*3/uL (ref 1.4–7.7)
RBC: 4.76 Mil/uL (ref 3.87–5.11)

## 2011-08-09 LAB — LIPID PANEL
Cholesterol: 176 mg/dL (ref 0–200)
VLDL: 39.4 mg/dL (ref 0.0–40.0)

## 2011-08-09 LAB — BASIC METABOLIC PANEL
BUN: 25 mg/dL — ABNORMAL HIGH (ref 6–23)
Calcium: 9.5 mg/dL (ref 8.4–10.5)
Creatinine, Ser: 1 mg/dL (ref 0.4–1.2)
GFR: 56.05 mL/min — ABNORMAL LOW (ref >60.00)
Potassium: 3.9 mEq/L (ref 3.5–5.1)

## 2011-08-09 LAB — HEPATIC FUNCTION PANEL
ALT: 20 U/L (ref 0–35)
AST: 20 U/L (ref 0–37)
Total Protein: 6.9 g/dL (ref 6.0–8.3)

## 2011-08-09 NOTE — Progress Notes (Signed)
Addended by: Doristine Devoid on: 08/09/2011 11:52 AM   Modules accepted: Orders

## 2011-08-17 ENCOUNTER — Other Ambulatory Visit: Payer: Self-pay | Admitting: Internal Medicine

## 2011-08-29 ENCOUNTER — Other Ambulatory Visit: Payer: Self-pay | Admitting: Internal Medicine

## 2011-08-29 MED ORDER — MONTELUKAST SODIUM 10 MG PO TABS: 10.0000 mg | ORAL_TABLET | Freq: Every day | ORAL | Status: DC

## 2011-08-29 NOTE — Telephone Encounter (Signed)
RX sent to pharmacy  

## 2011-10-11 LAB — I-STAT 8, (EC8 V) (CONVERTED LAB)
Acid-base deficit: 2
Glucose, Bld: 151 — ABNORMAL HIGH
TCO2: 26
pCO2, Ven: 43.9 — ABNORMAL LOW
pH, Ven: 7.351 — ABNORMAL HIGH

## 2011-10-11 LAB — PROTIME-INR
INR: 1
Prothrombin Time: 13.6

## 2011-10-11 LAB — HEMOGLOBIN: Hemoglobin: 13.2

## 2011-10-15 ENCOUNTER — Ambulatory Visit (INDEPENDENT_AMBULATORY_CARE_PROVIDER_SITE_OTHER): Payer: Medicare Other

## 2011-10-15 DIAGNOSIS — Z23 Encounter for immunization: Secondary | ICD-10-CM

## 2011-10-26 ENCOUNTER — Other Ambulatory Visit: Payer: Self-pay | Admitting: Internal Medicine

## 2011-12-23 ENCOUNTER — Encounter: Payer: Self-pay | Admitting: Internal Medicine

## 2011-12-31 DIAGNOSIS — Z8781 Personal history of (healed) traumatic fracture: Secondary | ICD-10-CM

## 2011-12-31 HISTORY — DX: Personal history of (healed) traumatic fracture: Z87.81

## 2012-01-10 ENCOUNTER — Ambulatory Visit: Payer: Self-pay | Admitting: Internal Medicine

## 2012-02-19 ENCOUNTER — Other Ambulatory Visit: Payer: Self-pay | Admitting: Internal Medicine

## 2012-02-20 ENCOUNTER — Telehealth: Payer: Self-pay | Admitting: *Deleted

## 2012-02-20 MED ORDER — LOSARTAN POTASSIUM 100 MG PO TABS: ORAL_TABLET | ORAL | Status: DC

## 2012-02-20 NOTE — Telephone Encounter (Signed)
Spoke with pharmacy, based on our records refill not due until May 2013. The pharmacist stated patient lost her bottle and would like a new script with refills

## 2012-02-20 NOTE — Telephone Encounter (Signed)
Prior Auth approved 02-18-12 until 02-17-13, pharmacy notified via fax.

## 2012-02-22 ENCOUNTER — Other Ambulatory Visit: Payer: Self-pay | Admitting: Internal Medicine

## 2012-03-12 ENCOUNTER — Encounter: Payer: Self-pay | Admitting: Internal Medicine

## 2012-03-12 ENCOUNTER — Ambulatory Visit (INDEPENDENT_AMBULATORY_CARE_PROVIDER_SITE_OTHER): Payer: Medicare Other | Admitting: Internal Medicine

## 2012-03-12 VITALS — BP 126/80 | HR 78 | Ht 65.0 in | Wt 169.6 lb

## 2012-03-12 DIAGNOSIS — J45909 Unspecified asthma, uncomplicated: Secondary | ICD-10-CM

## 2012-03-12 MED ORDER — MONTELUKAST SODIUM 10 MG PO TABS: 10.0000 mg | ORAL_TABLET | Freq: Every day | ORAL | Status: DC

## 2012-03-12 MED ORDER — BUDESONIDE 180 MCG/ACT IN AEPB: 2.0000 | INHALATION_SPRAY | Freq: Two times a day (BID) | RESPIRATORY_TRACT | Status: DC

## 2012-03-12 NOTE — Progress Notes (Signed)
03/12/12- [name] former smoker followed for asthma/asthmatic bronchitis, heat urticaria, GERD LOV-01/10/2011 Has done fairly well. Peak flow ranges 300-350. Does need Pulmicort inhaler. If she skips it she begins to cough and get congested. Any exposure to home of a friend who has a cat is active trigger chest tightness. Walks daily 2-1/2 miles. Usually expect some problem and spring and fall but not bad. Rarely needs rescue inhaler.  ROS-see HPI Constitutional:   No-   weight loss, night sweats, fevers, chills, fatigue, lassitude. HEENT:   No-  headaches, difficulty swallowing, tooth/dental problems, sore throat,       No-  sneezing, itching, ear ache, nasal congestion, post nasal drip,  CV:  No-   chest pain, orthopnea, PND, swelling in lower extremities, anasarca, dizziness, palpitations Resp: No-   shortness of breath with exertion or at rest.              No-   productive cough,  No non-productive cough,  No- coughing up of blood.              No-   change in color of mucus.  No- wheezing.   Skin: No-   rash or lesions. GI:  No-   heartburn, indigestion, abdominal pain, nausea, vomiting,  GU:  MS:  No-   joint pain or swelling.   Neuro-     nothing unusual Psych:  No- change in mood or affect. No depression or anxiety.  No memory loss.  OBJ- Physical Exam General- Alert, Oriented, Affect-appropriate, Distress- none acute Skin- rash-none, lesions- none, excoriation- none Lymphadenopathy- none Head- atraumatic            Eyes- Gross vision intact, PERRLA, conjunctivae and secretions clear            Ears- Hearing, canals-normal            Nose- Clear, no-Septal dev, mucus, polyps, erosion, perforation             Throat- Mallampati II , mucosa clear , drainage- none, tonsils- atrophic Neck- flexible , trachea midline, no stridor , thyroid nl, carotid no bruit Chest - symmetrical excursion , unlabored           Heart/CV- RRR , no murmur , no gallop  , no rub, nl s1 s2           - JVD- none , edema- none, stasis changes- none, varices- none           Lung- clear to P&A, wheeze- none, cough- none , dullness-none, rub- none           Chest wall-  Abd-  Br/ Gen/ Rectal- Not done, not indicated Extrem- cyanosis- none, clubbing, none, atrophy- none, strength- nl Neuro- grossly intact to observation

## 2012-03-12 NOTE — Patient Instructions (Signed)
Order- PCC/ DME  Incentive spirometer  Dx asthma  Refill scripts sent for Singulair and Pulmicort

## 2012-03-15 NOTE — Assessment & Plan Note (Signed)
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 Controlled. Plan-antihistamines, refill Singulair.

## 2012-03-15 NOTE — Assessment & Plan Note (Signed)
Rarely needs rescue inhaler. Maintenance Singulair and Pulmicort have worked well. We discussed the utility of devices such as peak flow meter which she has. Can provide incentive spirometer after discussion.

## 2012-05-15 ENCOUNTER — Emergency Department (HOSPITAL_COMMUNITY): Payer: No Typology Code available for payment source

## 2012-05-15 ENCOUNTER — Encounter (HOSPITAL_COMMUNITY): Payer: Self-pay | Admitting: *Deleted

## 2012-05-15 ENCOUNTER — Observation Stay (HOSPITAL_COMMUNITY)
Admission: EM | Admit: 2012-05-15 | Discharge: 2012-05-18 | Disposition: A | Discharge status: Home / Self Care | Payer: No Typology Code available for payment source | Attending: General Surgery | Admitting: General Surgery

## 2012-05-15 DIAGNOSIS — S2249XA Multiple fractures of ribs, unspecified side, initial encounter for closed fracture: Principal | ICD-10-CM | POA: Insufficient documentation

## 2012-05-15 DIAGNOSIS — E039 Hypothyroidism, unspecified: Secondary | ICD-10-CM | POA: Insufficient documentation

## 2012-05-15 DIAGNOSIS — K219 Gastro-esophageal reflux disease without esophagitis: Secondary | ICD-10-CM | POA: Insufficient documentation

## 2012-05-15 DIAGNOSIS — S2239XA Fracture of one rib, unspecified side, initial encounter for closed fracture: Secondary | ICD-10-CM | POA: Diagnosis present

## 2012-05-15 DIAGNOSIS — Y929 Unspecified place or not applicable: Secondary | ICD-10-CM | POA: Insufficient documentation

## 2012-05-15 DIAGNOSIS — S2231XA Fracture of one rib, right side, initial encounter for closed fracture: Secondary | ICD-10-CM

## 2012-05-15 DIAGNOSIS — E785 Hyperlipidemia, unspecified: Secondary | ICD-10-CM | POA: Insufficient documentation

## 2012-05-15 DIAGNOSIS — R0602 Shortness of breath: Secondary | ICD-10-CM | POA: Insufficient documentation

## 2012-05-15 DIAGNOSIS — S8000XA Contusion of unspecified knee, initial encounter: Secondary | ICD-10-CM | POA: Insufficient documentation

## 2012-05-15 DIAGNOSIS — I10 Essential (primary) hypertension: Secondary | ICD-10-CM | POA: Insufficient documentation

## 2012-05-15 DIAGNOSIS — M47812 Spondylosis without myelopathy or radiculopathy, cervical region: Secondary | ICD-10-CM | POA: Insufficient documentation

## 2012-05-15 LAB — COMPREHENSIVE METABOLIC PANEL
AST: 24 U/L (ref 0–37)
Albumin: 3.5 g/dL (ref 3.5–5.2)
CO2: 24 mEq/L (ref 19–32)
Calcium: 8.7 mg/dL (ref 8.4–10.5)
Creatinine, Ser: 1.01 mg/dL (ref 0.50–1.10)
GFR calc non Af Amer: 56 mL/min — ABNORMAL LOW (ref >90)

## 2012-05-15 LAB — POCT I-STAT, CHEM 8
BUN: 28 mg/dL — ABNORMAL HIGH (ref 6–23)
Calcium, Ion: 1.15 mmol/L (ref 1.12–1.32)
Chloride: 108 mEq/L (ref 96–112)
Creatinine, Ser: 1 mg/dL (ref 0.50–1.10)
Glucose, Bld: 131 mg/dL — ABNORMAL HIGH (ref 70–99)

## 2012-05-15 LAB — CBC
Hemoglobin: 12.4 g/dL (ref 12.0–15.0)
MCH: 29.8 pg (ref 26.0–34.0)
RBC: 4.16 MIL/uL (ref 3.87–5.11)

## 2012-05-15 LAB — SAMPLE TO BLOOD BANK

## 2012-05-15 LAB — LACTIC ACID, PLASMA: Lactic Acid, Venous: 1 mmol/L (ref 0.5–2.2)

## 2012-05-15 MED ORDER — ONDANSETRON HCL 4 MG/2ML IJ SOLN
INTRAMUSCULAR | Status: AC
  Filled 2012-05-15: qty 2

## 2012-05-15 MED ORDER — ONDANSETRON HCL 4 MG/2ML IJ SOLN
4.0000 mg | Freq: Once | INTRAMUSCULAR | Status: AC
  Administered 2012-05-16: 4 mg via INTRAVENOUS

## 2012-05-15 MED ORDER — SODIUM CHLORIDE 0.9 % IV SOLN
Freq: Once | INTRAVENOUS | Status: AC
  Administered 2012-05-15: 22:00:00 via INTRAVENOUS

## 2012-05-15 MED ORDER — ONDANSETRON HCL 4 MG/2ML IJ SOLN
4.0000 mg | Freq: Once | INTRAMUSCULAR | Status: AC
  Administered 2012-05-15: 4 mg via INTRAVENOUS

## 2012-05-15 MED ORDER — IOHEXOL 300 MG/ML  SOLN
100.0000 mL | Freq: Once | INTRAMUSCULAR | Status: AC | PRN
  Administered 2012-05-15: 100 mL via INTRAVENOUS

## 2012-05-15 MED ORDER — MORPHINE SULFATE 4 MG/ML IJ SOLN
4.0000 mg | INTRAMUSCULAR | Status: AC
  Administered 2012-05-15: 4 mg via INTRAVENOUS
  Filled 2012-05-15: qty 1

## 2012-05-15 MED ORDER — ONDANSETRON HCL 4 MG/2ML IJ SOLN
INTRAMUSCULAR | Status: AC
  Administered 2012-05-16: 4 mg via INTRAVENOUS
  Filled 2012-05-15: qty 2

## 2012-05-15 MED ORDER — MORPHINE SULFATE 2 MG/ML IJ SOLN
2.0000 mg | INTRAMUSCULAR | Status: DC | PRN
  Administered 2012-05-15 – 2012-05-16 (×2): 2 mg via INTRAVENOUS
  Filled 2012-05-15 (×2): qty 1

## 2012-05-15 NOTE — Progress Notes (Signed)
Introduced myself to pt and offer services for support and make calls. She said she did not need calls made presently but would like someone to call a friend later to check on her dog. Sarita Haver Holder, [employer]
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
## 2012-05-15 NOTE — ED Notes (Signed)
EKG run by Natalie Shannon New EKG given to Dr Bebe Shaggy, pt didn't have a old EKG

## 2012-05-16 ENCOUNTER — Encounter (HOSPITAL_COMMUNITY): Payer: Self-pay | Admitting: General Surgery

## 2012-05-16 DIAGNOSIS — S2239XA Fracture of one rib, unspecified side, initial encounter for closed fracture: Secondary | ICD-10-CM

## 2012-05-16 LAB — URINALYSIS, MICROSCOPIC ONLY
Glucose, UA: NEGATIVE mg/dL
Ketones, ur: 15 mg/dL — AB
Leukocytes, UA: NEGATIVE
pH: 5.5 (ref 5.0–8.0)

## 2012-05-16 LAB — CDS SEROLOGY

## 2012-05-16 MED ORDER — SIMVASTATIN 40 MG PO TABS
40.0000 mg | ORAL_TABLET | Freq: Every evening | ORAL | Status: DC
  Administered 2012-05-16 – 2012-05-18 (×3): 40 mg via ORAL
  Filled 2012-05-16 (×3): qty 1

## 2012-05-16 MED ORDER — ASPIRIN 81 MG PO CHEW
81.0000 mg | CHEWABLE_TABLET | Freq: Every day | ORAL | Status: DC
  Administered 2012-05-16 – 2012-05-18 (×3): 81 mg via ORAL
  Filled 2012-05-16 (×4): qty 1

## 2012-05-16 MED ORDER — OXYCODONE HCL 5 MG PO TABS: 20.0000 mg | ORAL_TABLET | ORAL | Status: DC | PRN

## 2012-05-16 MED ORDER — PROMETHAZINE HCL 25 MG PO TABS
12.5000 mg | ORAL_TABLET | Freq: Four times a day (QID) | ORAL | Status: DC | PRN
  Administered 2012-05-16 – 2012-05-18 (×6): 12.5 mg via ORAL
  Filled 2012-05-16 (×6): qty 1

## 2012-05-16 MED ORDER — FLUTICASONE PROPIONATE HFA 44 MCG/ACT IN AERO
2.0000 | INHALATION_SPRAY | Freq: Two times a day (BID) | RESPIRATORY_TRACT | Status: DC
  Administered 2012-05-16 – 2012-05-18 (×5): 2 via RESPIRATORY_TRACT
  Filled 2012-05-16: qty 10.6

## 2012-05-16 MED ORDER — RALOXIFENE HCL 60 MG PO TABS
60.0000 mg | ORAL_TABLET | Freq: Every day | ORAL | Status: DC
  Administered 2012-05-16 – 2012-05-18 (×3): 60 mg via ORAL
  Filled 2012-05-16 (×3): qty 1

## 2012-05-16 MED ORDER — OXYCODONE HCL 5 MG PO TABS
5.0000 mg | ORAL_TABLET | ORAL | Status: DC | PRN
  Administered 2012-05-16 (×2): 10 mg via ORAL
  Administered 2012-05-16 – 2012-05-17 (×3): 20 mg via ORAL
  Filled 2012-05-16 (×3): qty 4
  Filled 2012-05-16 (×2): qty 2
  Filled 2012-05-16: qty 4

## 2012-05-16 MED ORDER — KCL IN DEXTROSE-NACL 20-5-0.9 MEQ/L-%-% IV SOLN
INTRAVENOUS | Status: DC
  Administered 2012-05-16: 07:00:00 via INTRAVENOUS
  Filled 2012-05-16 (×2): qty 1000

## 2012-05-16 MED ORDER — LOSARTAN POTASSIUM 50 MG PO TABS: 100.0000 mg | ORAL_TABLET | ORAL | Status: DC

## 2012-05-16 MED ORDER — MONTELUKAST SODIUM 10 MG PO TABS
10.0000 mg | ORAL_TABLET | Freq: Every day | ORAL | Status: DC
  Administered 2012-05-16 – 2012-05-17 (×2): 10 mg via ORAL
  Filled 2012-05-16 (×3): qty 1

## 2012-05-16 MED ORDER — PANTOPRAZOLE SODIUM 40 MG PO TBEC: 40.0000 mg | DELAYED_RELEASE_TABLET | Freq: Every day | ORAL | Status: DC

## 2012-05-16 MED ORDER — LEVOTHYROXINE SODIUM 100 MCG PO TABS
100.0000 ug | ORAL_TABLET | Freq: Every day | ORAL | Status: DC
  Administered 2012-05-16 – 2012-05-18 (×3): 100 ug via ORAL
  Filled 2012-05-16 (×4): qty 1

## 2012-05-16 MED ORDER — MORPHINE SULFATE 2 MG/ML IJ SOLN
2.0000 mg | INTRAMUSCULAR | Status: DC | PRN
  Administered 2012-05-16 – 2012-05-18 (×6): 2 mg via INTRAVENOUS
  Filled 2012-05-16 (×6): qty 1

## 2012-05-16 MED ORDER — LOSARTAN POTASSIUM 50 MG PO TABS
100.0000 mg | ORAL_TABLET | Freq: Every day | ORAL | Status: DC
  Administered 2012-05-16 – 2012-05-18 (×3): 100 mg via ORAL
  Filled 2012-05-16 (×3): qty 2

## 2012-05-16 MED ORDER — HYDROCODONE-ACETAMINOPHEN 5-325 MG PO TABS: 1.0000 | ORAL_TABLET | ORAL | Status: DC | PRN

## 2012-05-16 MED ORDER — PANTOPRAZOLE SODIUM 40 MG IV SOLR
40.0000 mg | Freq: Every day | INTRAVENOUS | Status: DC
  Filled 2012-05-16: qty 40

## 2012-05-16 MED ORDER — HYDROCODONE-ACETAMINOPHEN 5-325 MG PO TABS
1.0000 | ORAL_TABLET | ORAL | Status: DC | PRN
  Filled 2012-05-16: qty 2

## 2012-05-16 MED ORDER — PANTOPRAZOLE SODIUM 40 MG PO TBEC
40.0000 mg | DELAYED_RELEASE_TABLET | Freq: Every day | ORAL | Status: DC
Start: 2012-05-16 — End: 2012-05-18
  Administered 2012-05-16 – 2012-05-18 (×3): 40 mg via ORAL
  Filled 2012-05-16 (×3): qty 1

## 2012-05-16 MED ORDER — ONDANSETRON HCL 4 MG/2ML IJ SOLN
4.0000 mg | Freq: Four times a day (QID) | INTRAMUSCULAR | Status: DC | PRN
  Administered 2012-05-16 (×3): 4 mg via INTRAVENOUS
  Filled 2012-05-16 (×3): qty 2

## 2012-05-16 MED ORDER — FLUTICASONE PROPIONATE 50 MCG/ACT NA SUSP
2.0000 | Freq: Every day | NASAL | Status: DC
  Administered 2012-05-18: 2 via NASAL
  Filled 2012-05-16: qty 16

## 2012-05-16 MED ORDER — ONDANSETRON HCL 4 MG PO TABS: 4.0000 mg | ORAL_TABLET | Freq: Four times a day (QID) | ORAL | Status: DC | PRN

## 2012-05-16 MED ORDER — PROMETHAZINE HCL 25 MG/ML IJ SOLN: 12.5000 mg | Freq: Four times a day (QID) | INTRAMUSCULAR | Status: DC | PRN

## 2012-05-16 MED ORDER — ALBUTEROL SULFATE HFA 108 (90 BASE) MCG/ACT IN AERS
2.0000 | INHALATION_SPRAY | RESPIRATORY_TRACT | Status: DC | PRN
Start: 2012-05-16 — End: 2012-05-18

## 2012-05-16 MED ORDER — LOSARTAN POTASSIUM 50 MG PO TABS
50.0000 mg | ORAL_TABLET | Freq: Every day | ORAL | Status: DC | PRN
  Filled 2012-05-16: qty 1

## 2012-05-16 MED ORDER — ASPIRIN 81 MG PO TABS: 81.0000 mg | ORAL_TABLET | Freq: Every day | ORAL | Status: DC
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
## 2012-05-17 MED ORDER — HYDROCODONE-ACETAMINOPHEN 5-325 MG PO TABS
1.0000 | ORAL_TABLET | ORAL | Status: DC | PRN
  Administered 2012-05-18 (×2): 1 via ORAL
  Filled 2012-05-17 (×2): qty 1

## 2012-05-17 MED ORDER — CYCLOBENZAPRINE HCL 10 MG PO TABS
5.0000 mg | ORAL_TABLET | Freq: Three times a day (TID) | ORAL | Status: DC | PRN
  Administered 2012-05-17 – 2012-05-18 (×2): 5 mg via ORAL
  Filled 2012-05-17: qty 0.5
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
## 2012-05-18 MED ORDER — NAPROXEN 500 MG PO TABS
500.0000 mg | ORAL_TABLET | Freq: Two times a day (BID) | ORAL | Status: DC
  Administered 2012-05-18 (×2): 500 mg via ORAL
  Filled 2012-05-18 (×3): qty 1

## 2012-05-18 MED ORDER — TRAMADOL HCL 50 MG PO TABS: 100.0000 mg | ORAL_TABLET | Freq: Four times a day (QID) | ORAL | Status: AC

## 2012-05-18 MED ORDER — PROMETHAZINE HCL 12.5 MG PO TABS: 12.5000 mg | ORAL_TABLET | Freq: Four times a day (QID) | ORAL | Status: DC | PRN

## 2012-05-18 MED ORDER — TRAMADOL HCL 50 MG PO TABS
100.0000 mg | ORAL_TABLET | Freq: Four times a day (QID) | ORAL | Status: DC
  Administered 2012-05-18 (×2): 100 mg via ORAL
  Filled 2012-05-18 (×2): qty 2

## 2012-05-18 MED ORDER — HYDROCODONE-ACETAMINOPHEN 5-325 MG PO TABS: 1.0000 | ORAL_TABLET | ORAL | Status: DC | PRN

## 2012-05-18 MED ORDER — HYDROCODONE-ACETAMINOPHEN 5-325 MG PO TABS: 1.0000 | ORAL_TABLET | ORAL | Status: AC | PRN

## 2012-05-18 MED ORDER — MORPHINE SULFATE 2 MG/ML IJ SOLN: 2.0000 mg | INTRAMUSCULAR | Status: DC | PRN

## 2012-05-18 MED ORDER — CYCLOBENZAPRINE HCL 5 MG PO TABS: 5.0000 mg | ORAL_TABLET | Freq: Three times a day (TID) | ORAL | Status: AC | PRN

## 2012-05-18 MED ORDER — NAPROXEN 500 MG PO TABS: 500.0000 mg | ORAL_TABLET | Freq: Two times a day (BID) | ORAL | Status: AC

## 2012-05-18 NOTE — Progress Notes (Signed)
Discharge home. Home discharge instruction given. 
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
## 2012-05-18 NOTE — Progress Notes (Signed)
Doing better.  Probably can go home later today.  This patient has been seen and I agree with the findings and treatment plan.  Marta Lamas. Gae Bon, MD, FACS [phone] (pager) [phone] (direct pager) Trauma Surgeon
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
## 2012-05-18 NOTE — Discharge Summary (Signed)
Okay to go home this afternoon.  This patient has been seen and I agree with the findings and treatment plan.  Marta Lamas. Gae Bon, MD, FACS [phone] (pager) [phone] (direct pager) Trauma Surgeon

## 2012-07-18 ENCOUNTER — Other Ambulatory Visit: Payer: Self-pay | Admitting: Internal Medicine

## 2012-08-10 ENCOUNTER — Encounter (HOSPITAL_COMMUNITY): Payer: Self-pay | Admitting: Emergency Medicine

## 2012-08-10 ENCOUNTER — Emergency Department (HOSPITAL_COMMUNITY)
Admission: EM | Admit: 2012-08-10 | Discharge: 2012-08-10 | Disposition: A | Discharge status: Home / Self Care | Payer: Medicare Other | Attending: Emergency Medicine | Admitting: Emergency Medicine

## 2012-08-10 ENCOUNTER — Emergency Department (HOSPITAL_COMMUNITY): Payer: Medicare Other

## 2012-08-10 DIAGNOSIS — IMO0002 Reserved for concepts with insufficient information to code with codable children: Secondary | ICD-10-CM | POA: Insufficient documentation

## 2012-08-10 DIAGNOSIS — E785 Hyperlipidemia, unspecified: Secondary | ICD-10-CM | POA: Insufficient documentation

## 2012-08-10 DIAGNOSIS — S91009A Unspecified open wound, unspecified ankle, initial encounter: Secondary | ICD-10-CM | POA: Insufficient documentation

## 2012-08-10 DIAGNOSIS — Z7982 Long term (current) use of aspirin: Secondary | ICD-10-CM | POA: Insufficient documentation

## 2012-08-10 DIAGNOSIS — I10 Essential (primary) hypertension: Secondary | ICD-10-CM | POA: Insufficient documentation

## 2012-08-10 DIAGNOSIS — S81019A Laceration without foreign body, unspecified knee, initial encounter: Secondary | ICD-10-CM

## 2012-08-10 DIAGNOSIS — Y93K1 Activity, walking an animal: Secondary | ICD-10-CM | POA: Insufficient documentation

## 2012-08-10 DIAGNOSIS — Y92410 Unspecified street and highway as the place of occurrence of the external cause: Secondary | ICD-10-CM | POA: Insufficient documentation

## 2012-08-10 DIAGNOSIS — J45909 Unspecified asthma, uncomplicated: Secondary | ICD-10-CM | POA: Insufficient documentation

## 2012-08-10 DIAGNOSIS — W19XXXA Unspecified fall, initial encounter: Secondary | ICD-10-CM

## 2012-08-10 DIAGNOSIS — W010XXA Fall on same level from slipping, tripping and stumbling without subsequent striking against object, initial encounter: Secondary | ICD-10-CM | POA: Insufficient documentation

## 2012-08-10 DIAGNOSIS — Z23 Encounter for immunization: Secondary | ICD-10-CM | POA: Insufficient documentation

## 2012-08-10 DIAGNOSIS — Z79899 Other long term (current) drug therapy: Secondary | ICD-10-CM | POA: Insufficient documentation

## 2012-08-10 DIAGNOSIS — K219 Gastro-esophageal reflux disease without esophagitis: Secondary | ICD-10-CM | POA: Insufficient documentation

## 2012-08-10 DIAGNOSIS — S81009A Unspecified open wound, unspecified knee, initial encounter: Secondary | ICD-10-CM | POA: Insufficient documentation

## 2012-08-10 DIAGNOSIS — S00511A Abrasion of lip, initial encounter: Secondary | ICD-10-CM

## 2012-08-10 MED ORDER — TETANUS-DIPHTH-ACELL PERTUSSIS 5-2.5-18.5 LF-MCG/0.5 IM SUSP
0.5000 mL | Freq: Once | INTRAMUSCULAR | Status: AC
  Administered 2012-08-10: 0.5 mL via INTRAMUSCULAR
  Filled 2012-08-10: qty 0.5

## 2012-08-10 MED ORDER — BACITRACIN ZINC 500 UNIT/GM EX OINT
TOPICAL_OINTMENT | CUTANEOUS | Status: AC
  Administered 2012-08-10: 23:00:00
  Filled 2012-08-10: qty 0.9
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
## 2012-08-10 NOTE — ED Provider Notes (Signed)
Medical screening examination/treatment/procedure(s) were performed by non-physician practitioner and as supervising physician I was immediately available for consultation/collaboration. Devoria Albe, MD, Armando Gang   Ward Givens, MD 08/10/12 [phone]

## 2012-08-11 ENCOUNTER — Encounter: Payer: Medicare Other | Admitting: Internal Medicine

## 2012-08-20 ENCOUNTER — Telehealth: Payer: Self-pay | Admitting: Internal Medicine

## 2012-08-20 NOTE — Telephone Encounter (Signed)
wants Whooping cough vaccinations pt stated she doesn't care if insurance will pay for it or not as she has asthma and she feels it is medically necessary Cb# [phone]

## 2012-08-20 NOTE — Telephone Encounter (Signed)
This vaccine is also know as TDaP, patient is UTD, se received when she was in ER at [facility] on 08/10/12. Patient informed

## 2012-08-25 ENCOUNTER — Encounter: Payer: Self-pay | Admitting: Internal Medicine

## 2012-08-25 ENCOUNTER — Ambulatory Visit (INDEPENDENT_AMBULATORY_CARE_PROVIDER_SITE_OTHER): Payer: Medicare Other | Admitting: Internal Medicine

## 2012-08-25 VITALS — BP 124/78 | HR 115 | Temp 97.6°F | Wt 168.6 lb

## 2012-08-25 DIAGNOSIS — Z4802 Encounter for removal of sutures: Secondary | ICD-10-CM

## 2012-08-25 DIAGNOSIS — S8991XA Unspecified injury of right lower leg, initial encounter: Secondary | ICD-10-CM

## 2012-08-25 DIAGNOSIS — S8990XA Unspecified injury of unspecified lower leg, initial encounter: Secondary | ICD-10-CM
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
## 2012-08-25 NOTE — Patient Instructions (Signed)
Return for suture removal once scab gone. Dip gauze in  sterile saline and applied to the wound twice a day. Cover the wound with Telfa , non stick dressing  without any antibiotic ointment. The saline can be purchased at the drugstore or you can make your own .Boil cup of salt in a gallon of water. Store mixture  in a clean container.Report Warning  signs as discussed (red streaks, pus, fever, increasing pain).

## 2012-09-29 ENCOUNTER — Ambulatory Visit (INDEPENDENT_AMBULATORY_CARE_PROVIDER_SITE_OTHER): Payer: Medicare Other | Admitting: Internal Medicine

## 2012-09-29 ENCOUNTER — Encounter: Payer: Self-pay | Admitting: Internal Medicine

## 2012-09-29 VITALS — BP 120/78 | HR 73 | Temp 97.8°F | Wt 173.4 lb

## 2012-09-29 DIAGNOSIS — Z23 Encounter for immunization: Secondary | ICD-10-CM

## 2012-09-29 DIAGNOSIS — Z4802 Encounter for removal of sutures: Secondary | ICD-10-CM

## 2012-09-29 NOTE — Progress Notes (Signed)
  Subjective:    Patient ID: Natalie Shannon, female    DOB: 1941-11-16, 71 y.o.   MRN: [id number]  HPI She returned to have the remaining 3 sutures removed as the eschar had lifted from the underlying skin. 7 sutures were removed on 8/27 but 3 had remained.  She's had no associated complications with retained sutures such as red streaks, fever, or chills. She does have intermittent sharp pain infrequently, localized to the anterior patellar region    Review of Systems     Objective:   Physical Exam be elliptical operative scar is well healed; there is residual scar tissue. No patellar effusion is noted. Range of motion is excellent. Pedal pulses are present and normal. There is no evidence of cellulitis.  The eschar was removed with the last 3 sutures.  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  Plan: Moisturizing agent recommended for the residual scar tissue/eschar. Warning signs discussed

## 2012-09-29 NOTE — Patient Instructions (Addendum)
Use Eucerin or Aveeno Daily  Moisturizing Lotion  twice a day  for the dry skin. Bathe with moisturizing liquid soap , not bar soap. 

## 2012-10-12 ENCOUNTER — Other Ambulatory Visit: Payer: Self-pay | Admitting: Internal Medicine

## 2012-10-19 ENCOUNTER — Encounter: Payer: Self-pay | Admitting: Internal Medicine

## 2012-10-19 ENCOUNTER — Ambulatory Visit (INDEPENDENT_AMBULATORY_CARE_PROVIDER_SITE_OTHER): Payer: Medicare Other | Admitting: Internal Medicine

## 2012-10-19 VITALS — BP 126/78 | HR 81 | Temp 97.6°F | Resp 12 | Ht 66.0 in | Wt 171.6 lb

## 2012-10-19 DIAGNOSIS — K219 Gastro-esophageal reflux disease without esophagitis: Secondary | ICD-10-CM

## 2012-10-19 DIAGNOSIS — I10 Essential (primary) hypertension: Secondary | ICD-10-CM

## 2012-10-19 DIAGNOSIS — Z0000 Encounter for general adult medical examination without abnormal findings: Secondary | ICD-10-CM

## 2012-10-19 DIAGNOSIS — M79603 Pain in arm, unspecified: Secondary | ICD-10-CM

## 2012-10-19 DIAGNOSIS — E039 Hypothyroidism, unspecified: Secondary | ICD-10-CM

## 2012-10-19 DIAGNOSIS — R7989 Other specified abnormal findings of blood chemistry: Secondary | ICD-10-CM

## 2012-10-19 DIAGNOSIS — M79609 Pain in unspecified limb: Secondary | ICD-10-CM

## 2012-10-19 DIAGNOSIS — E782 Mixed hyperlipidemia: Secondary | ICD-10-CM

## 2012-10-19 LAB — CBC WITH DIFFERENTIAL/PLATELET
Basophils Absolute: 0.1 10*3/uL (ref 0.0–0.1)
Eosinophils Absolute: 0.2 10*3/uL (ref 0.0–0.7)
Lymphocytes Relative: 18.3 % (ref 12.0–46.0)
MCHC: 32.6 g/dL (ref 30.0–36.0)
Neutrophils Relative %: 70.7 % (ref 43.0–77.0)
Platelets: 358 10*3/uL (ref 150.0–400.0)
RDW: 12.9 % (ref 11.5–14.6)

## 2012-10-19 LAB — HEPATIC FUNCTION PANEL
Bilirubin, Direct: 0 mg/dL (ref 0.0–0.3)
Total Bilirubin: 0.5 mg/dL (ref 0.3–1.2)
Total Protein: 7.2 g/dL (ref 6.0–8.3)

## 2012-10-19 LAB — LIPID PANEL
HDL: 61.4 mg/dL (ref >39.00)
Total CHOL/HDL Ratio: 3
Triglycerides: 220 mg/dL — ABNORMAL HIGH (ref 0.0–149.0)
VLDL: 44 mg/dL — ABNORMAL HIGH (ref 0.0–40.0)

## 2012-10-19 LAB — BASIC METABOLIC PANEL
CO2: 27 mEq/L (ref 19–32)
Calcium: 9.7 mg/dL (ref 8.4–10.5)
Creatinine, Ser: 1 mg/dL (ref 0.4–1.2)
Glucose, Bld: 95 mg/dL (ref 70–99)

## 2012-10-19 LAB — TSH: TSH: 3.36 u[IU]/mL (ref 0.35–5.50)

## 2012-10-19 LAB — HEMOGLOBIN A1C: Hgb A1c MFr Bld: 6 % (ref 4.6–6.5)

## 2012-10-19 NOTE — Progress Notes (Signed)
Subjective:    Patient ID: Natalie Shannon, female    DOB: 11-15-1941, 71 y.o.   MRN: [id number]  HPI Medicare Wellness Visit:  The following psychosocial & medical history were reviewed as required by Medicare.   Social history: caffeine: minimal , alcohol:  no ,  tobacco use : quit 1971  & exercise : minimal since fall 08/11/12.   Home & personal  safety / fall risk: difficulty raising feet since fall, activities of daily living: no limitations , seatbelt use : yes , and smoke alarm employment : yes .  Power of Attorney/Living Will status :in place  Vision ( as recorded per Nurse) & Hearing  evaluation :  Ophth exam 12/12.No hearing exam Orientation :oriented X 3 , memory & recall :oriented X 3,  math testing:good,and mood & affect : good . Depression / anxiety: denied Travel history : last [place] age 30 , immunization status :up to date , transfusion history:  no, and preventive health surveillance ( colonoscopies, BMD , etc as per protocol/ [facility]): colonoscopy due 2019, Dental care:  Every 6 mos. Chart reviewed &  Updated. Active issues reviewed & addressed.       Review of Systems HYPERTENSION: Disease Monitoring: Blood pressure range-125/80+  Chest pain, palpitations- no      Dyspnea- no Medications: Compliance- yes  Lightheadedness,Syncope-no   Edema- only since fall   FASTING HYPERGLYCEMIA, PMH of:  Polyuria/phagia/dipsia- no       Visual problems- no  HYPERLIPIDEMIA: Disease Monitoring: See symptoms for Hypertension Medications: Compliance- yes  Abd pain, bowel changes- no   Muscle aches- only related to fall; she describes intermittent sharp pains in the left upper arm when she attempts to open the car door or at times when carrying items. She also has residual pain at the elbow with direct pressure.         Objective:   Physical Exam Gen.: Healthy and well-nourished in appearance. Alert, appropriate and cooperative throughout exam. Head: Normocephalic without  obvious abnormalities Eyes: No corneal or conjunctival inflammation noted. Pupils equal round reactive to light and accommodation. Fundal exam is benign without hemorrhages, exudate, papilledema. Extraocular motion intact. Vision grossly decreased OS  Ears: External  ear exam reveals no significant lesions or deformities. Canals clear .TMs normal. Hearing is normal to whisper @ 6 ft Nose: External nasal exam reveals no deformity or inflammation. Nasal mucosa are pink and moist. No lesions or exudates noted. Septum slightly dislocated to R Mouth: Oral mucosa and oropharynx reveal no lesions or exudates. Teeth in good repair. Neck: No deformities, masses, or tenderness noted. Range of motion &Thyroid  normal. Lungs: Normal respiratory effort; chest expands symmetrically. Lungs are clear to auscultation without rales, wheezes, or increased work of breathing. Heart: Normal rate and rhythm. Normal S1 and S2. No gallop, click, or rub. S4 w/o  murmur. Abdomen: Bowel sounds normal; abdomen soft and nontender. No masses, organomegaly or hernias noted. Genitalia: Dr Arelia Sneddon, Gyn                                                                     Musculoskeletal/extremities: Lordosis noted of  the thoracic  spine. No clubbing, cyanosis, edema, or deformity noted. Range of motion  normal .Tone & strength  normal.Joints normal. Nail health  good. There is tenderness to deep palpation over the mid left upper extremity medially and laterally as well as at the biceps. Vascular: Carotid, radial artery, dorsalis pedis and  posterior tibial pulses are full and equal. No bruits present. Neurologic: Alert and oriented x3. Deep tendon reflexes symmetrical and normal (R knee not tested @ her request).          Skin: Intact without suspicious lesions or rashes.Slight keloid R knee Lymph: No cervical, axillary lymphadenopathy present. Psych: Mood and affect are normal. 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 Orthopedic assessment recommended as the fall was 2 months ago Plan: see Orders

## 2012-10-19 NOTE — Patient Instructions (Addendum)
Review and correct the record as indicated. Please share record with Dr Cleophas Dunker.  If you activate My Chart; the results can be released to you as soon as they populate from the lab. If you choose not to use this program; the labs have to be reviewed, copied & mailed   causing a delay in getting the results to you.

## 2012-10-20 ENCOUNTER — Telehealth: Payer: Self-pay

## 2012-10-20 NOTE — Telephone Encounter (Signed)
Pt called in concerning lab results. Advised pt results and advised her Water quality scientist mailed them out today.     MW

## 2012-11-02 ENCOUNTER — Telehealth: Payer: Self-pay | Admitting: Internal Medicine

## 2012-11-02 NOTE — Telephone Encounter (Signed)
We have no indication of allergy to aspirin or NSAIDs, so ok to take Voltaren

## 2012-11-02 NOTE — Telephone Encounter (Signed)
I called and made [facility] aware of this. Nothing further was needed

## 2012-11-02 NOTE — Telephone Encounter (Signed)
I spoke with [facility] and stated Dr. Edd Fabian from [facility] prescribed pt voltaren tablets. Per the pharmacists his office advised them to check with [place] to make sure this is okay for pt to take since she has asthma. They need the okay from Dr. Maple Hudson before they can fill this for pt. Please advise Dr. Maple Hudson thanks

## 2012-11-10 ENCOUNTER — Other Ambulatory Visit: Payer: Self-pay | Admitting: Internal Medicine

## 2012-11-10 NOTE — Telephone Encounter (Signed)
refill Zocor oral Tablet 40mg  #90 take one tablet by mouth in the evening--last fill 07.22.13--last ov 10.21.13 V70

## 2012-11-11 MED ORDER — SIMVASTATIN 40 MG PO TABS: ORAL_TABLET | ORAL | Status: DC

## 2012-11-11 NOTE — Telephone Encounter (Signed)
RX sent

## 2012-11-23 ENCOUNTER — Other Ambulatory Visit: Payer: Self-pay | Admitting: Internal Medicine

## 2012-12-24 ENCOUNTER — Other Ambulatory Visit: Payer: Self-pay | Admitting: Internal Medicine

## 2013-02-14 ENCOUNTER — Other Ambulatory Visit: Payer: Self-pay | Admitting: Internal Medicine

## 2013-03-12 ENCOUNTER — Ambulatory Visit (INDEPENDENT_AMBULATORY_CARE_PROVIDER_SITE_OTHER)
Admission: RE | Admit: 2013-03-12 | Discharge: 2013-03-12 | Disposition: A | Discharge status: Home / Self Care | Payer: Medicare PPO | Source: Ambulatory Visit | Attending: Internal Medicine | Admitting: Internal Medicine

## 2013-03-12 ENCOUNTER — Ambulatory Visit (INDEPENDENT_AMBULATORY_CARE_PROVIDER_SITE_OTHER): Payer: Medicare PPO | Admitting: Internal Medicine

## 2013-03-12 ENCOUNTER — Encounter: Payer: Self-pay | Admitting: Internal Medicine

## 2013-03-12 VITALS — BP 120/80 | HR 80 | Ht 65.0 in | Wt 175.2 lb

## 2013-03-12 DIAGNOSIS — J45909 Unspecified asthma, uncomplicated: Secondary | ICD-10-CM

## 2013-03-12 MED ORDER — MONTELUKAST SODIUM 10 MG PO TABS: 10.0000 mg | ORAL_TABLET | Freq: Every day | ORAL | Status: DC

## 2013-03-12 MED ORDER — BUDESONIDE 180 MCG/ACT IN AEPB: 2.0000 | INHALATION_SPRAY | Freq: Two times a day (BID) | RESPIRATORY_TRACT | Status: DC

## 2013-03-12 MED ORDER — EPINEPHRINE 0.3 MG/0.3ML IJ DEVI: 0.3000 mg | Freq: Every day | INTRAMUSCULAR | Status: DC | PRN

## 2013-03-12 MED ORDER — FLUTICASONE PROPIONATE 50 MCG/ACT NA SUSP: 2.0000 | Freq: Every day | NASAL | Status: DC

## 2013-03-12 NOTE — Patient Instructions (Addendum)
We can continue allergy vaccine 1:10 GO  Use the allergy eye drop your eye doctor gave  Refill scripts for your epipen, pulmicort, singulair, flonase  Ok to use an otc antihistamine like Claritin or Allegra  Order- CXR   Dx asthma  Compare with CT 2013
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
## 2013-03-16 ENCOUNTER — Telehealth: Payer: Self-pay | Admitting: Internal Medicine
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
## 2013-03-16 NOTE — Assessment & Plan Note (Signed)
Probably recent allergic rhinitis exacerbation due to tree pollens. We discussed antihistamines and her Flonase. She will report if control is not good enough.

## 2013-03-16 NOTE — Telephone Encounter (Signed)
PT informed of cxr results per Dr Maple Hudson

## 2013-04-01 ENCOUNTER — Ambulatory Visit (INDEPENDENT_AMBULATORY_CARE_PROVIDER_SITE_OTHER): Payer: Medicare PPO

## 2013-04-01 DIAGNOSIS — J309 Allergic rhinitis, unspecified: Secondary | ICD-10-CM

## 2013-04-28 ENCOUNTER — Encounter: Payer: Self-pay | Admitting: Internal Medicine

## 2013-05-24 ENCOUNTER — Other Ambulatory Visit: Payer: Self-pay | Admitting: Internal Medicine

## 2013-06-23 ENCOUNTER — Other Ambulatory Visit: Payer: Self-pay | Admitting: Internal Medicine

## 2013-06-23 NOTE — Telephone Encounter (Signed)
Refill done.  

## 2013-07-08 ENCOUNTER — Other Ambulatory Visit: Payer: Self-pay | Admitting: Dermatology

## 2013-09-06 ENCOUNTER — Ambulatory Visit (INDEPENDENT_AMBULATORY_CARE_PROVIDER_SITE_OTHER): Payer: Medicare PPO

## 2013-09-06 DIAGNOSIS — J309 Allergic rhinitis, unspecified: Secondary | ICD-10-CM

## 2013-10-19 ENCOUNTER — Telehealth: Payer: Self-pay

## 2013-10-19 NOTE — Telephone Encounter (Signed)
Medication and allergies: reviewed and updated  90 day supply/mail order: none Local pharmacy: [facility] at [address]   Immunizations due:  Admin flu vaccine upon arrival  A/P:   No recent FH SH changes  To Discuss with Provider: Advised by optometrist she needs cataract surgery--pondering the risk

## 2013-10-19 NOTE — Telephone Encounter (Signed)
Left message for call back.

## 2013-10-21 ENCOUNTER — Encounter: Payer: Self-pay | Admitting: Internal Medicine

## 2013-10-21 ENCOUNTER — Ambulatory Visit (INDEPENDENT_AMBULATORY_CARE_PROVIDER_SITE_OTHER): Payer: Medicare PPO | Admitting: Internal Medicine

## 2013-10-21 VITALS — BP 120/80 | HR 68 | Temp 97.5°F | Wt 171.0 lb

## 2013-10-21 DIAGNOSIS — I10 Essential (primary) hypertension: Secondary | ICD-10-CM

## 2013-10-21 DIAGNOSIS — K219 Gastro-esophageal reflux disease without esophagitis: Secondary | ICD-10-CM

## 2013-10-21 DIAGNOSIS — G5600 Carpal tunnel syndrome, unspecified upper limb: Secondary | ICD-10-CM | POA: Insufficient documentation

## 2013-10-21 DIAGNOSIS — Z23 Encounter for immunization: Secondary | ICD-10-CM

## 2013-10-21 DIAGNOSIS — E782 Mixed hyperlipidemia: Secondary | ICD-10-CM

## 2013-10-21 DIAGNOSIS — E039 Hypothyroidism, unspecified: Secondary | ICD-10-CM

## 2013-10-21 DIAGNOSIS — Z0000 Encounter for general adult medical examination without abnormal findings: Secondary | ICD-10-CM

## 2013-10-21 LAB — CBC WITH DIFFERENTIAL/PLATELET
Eosinophils Relative: 4.7 % (ref 0.0–5.0)
HCT: 41.1 % (ref 36.0–46.0)
Lymphs Abs: 1 10*3/uL (ref 0.7–4.0)
MCV: 87.5 fl (ref 78.0–100.0)
Monocytes Absolute: 0.4 10*3/uL (ref 0.1–1.0)
Platelets: 320 10*3/uL (ref 150.0–400.0)
RDW: 14.2 % (ref 11.5–14.6)

## 2013-10-21 LAB — BASIC METABOLIC PANEL
BUN: 23 mg/dL (ref 6–23)
Chloride: 105 mEq/L (ref 96–112)
Glucose, Bld: 96 mg/dL (ref 70–99)
Potassium: 4 mEq/L (ref 3.5–5.1)

## 2013-10-21 LAB — HEPATIC FUNCTION PANEL
ALT: 32 U/L (ref 0–35)
Total Bilirubin: 0.5 mg/dL (ref 0.3–1.2)

## 2013-10-21 LAB — LIPID PANEL
Cholesterol: 171 mg/dL (ref 0–200)
Total CHOL/HDL Ratio: 3
Triglycerides: 217 mg/dL — ABNORMAL HIGH (ref 0.0–149.0)

## 2013-10-21 NOTE — Patient Instructions (Addendum)
Your next office appointment will be determined based upon review of your pending labs . Those instructions will be transmitted to you by mail Share results with all non [place] medical staff seen  

## 2013-10-21 NOTE — Progress Notes (Signed)
Subjective:    Patient ID: Natalie Shannon, female    DOB: [birth year], 72 y.o.   MRN: [id number]  HPI  Medicare Wellness Visit: Psychosocial and medical history were reviewed as required by Medicare (abuse, antisocial behavior risk, forearm risk). Social history: Caffeine:none  , Alcohol: no , Tobacco WUJ:WJXB 1969 Exercise:see below Personal safety/fall risk:no Limitations of activities of daily living:no Seatbelt smoke alarm use:yes Healthcare Power of Attorney/Living Will status: in place Ophthalmologic exam status:current; cataract surgery pending Hearing evaluation status:not current Orientation: Oriented X3 Memory and recall: good Math testing: good Depression/anxiety assessment: denied Foreign travel history:[place] pre teens Immunization status: shingles/Flu/pneumonia/tetanus: current Transfusion history:no Preventive health care maintenance status: Colonoscopy/BMD/mammogram/Pap as per protocol/standard care: current Dental care:every 6 mos Chart reviewed and updated. Active issues reviewed and addressed.    Review of Systems She is on a heart healthy diet; she exercises as stationary bike & walking 60 minutes 6 times per week without symptoms. Specifically she denies chest pain, palpitations, dyspnea, or claudication. Family history is negative for premature coronary disease . Advanced cholesterol testing reveals her LDL goal is less than 100. There is medication compliance with the statin. Significant abdominal symptoms, memory deficit, or myalgias denied. BP @ home 120/80 on average. GERD controlled with Omeprazole. .     Objective:   Physical Exam Gen.: Healthy and well-nourished in appearance. Alert, appropriate and cooperative throughout exam.Appears younger than stated age  Head: Normocephalic without obvious abnormalities  Eyes: No corneal or conjunctival inflammation noted. Pupils equal round reactive to light and accommodation.  Extraocular motion intact. Mild  OD scleritis. Ears: External  ear exam reveals no significant lesions or deformities. Canals clear .TMs normal. Hearing is grossly normal bilaterally. Nose: External nasal exam reveals no deformity or inflammation. Nasal mucosa are pink and moist. No lesions or exudates noted. Septum minimally dislocated Mouth: Oral mucosa and oropharynx reveal no lesions or exudates. Teeth in good repair. Neck: No deformities, masses, or tenderness noted. Range of motion decreased. Thyroid normal. Lungs: Normal respiratory effort; chest expands symmetrically. Lungs are clear to auscultation without rales, wheezes, or increased work of breathing. Heart: Normal rate and rhythm. Normal S1 and S2. No gallop, click, or rub.S4 with slurring at  LSB. Abdomen: Bowel sounds normal; abdomen soft and nontender. No  organomegaly or hernias noted. Lipoma epigastrium Genitalia: per Gyn                                  Musculoskeletal/extremities: Minimally accentuated curvature of upper thoracic  Spine.  No clubbing, cyanosis, edema, or significant extremity  deformity noted. Range of motion normal .Tone & strength  Normal. Joints normal . Nail health good. Able to lie down & sit up w/o help. Negative SLR bilaterally Vascular: Carotid, radial artery, dorsalis pedis and  posterior tibial pulses are full and equal. No bruits present. Neurologic: Alert and oriented x3. Deep tendon reflexes symmetrical and normal.  Gait normal  including heel & toe walking .        Skin: Intact without suspicious lesions or rashes. Lymph: No cervical, axillary lymphadenopathy present. Psych: Mood and affect are normal. Normally interactive  Assessment & Plan:  #1 comprehensive physical exam; no acute findings  Plan: see Orders  & Recommendations

## 2013-10-22 ENCOUNTER — Encounter: Payer: Self-pay | Admitting: General Practice

## 2013-10-22 ENCOUNTER — Other Ambulatory Visit: Payer: Self-pay | Admitting: Internal Medicine

## 2013-10-22 DIAGNOSIS — E782 Mixed hyperlipidemia: Secondary | ICD-10-CM

## 2013-10-22 DIAGNOSIS — E039 Hypothyroidism, unspecified: Secondary | ICD-10-CM

## 2013-10-22 MED ORDER — LEVOTHYROXINE SODIUM 100 MCG PO TABS: ORAL_TABLET | ORAL | Status: DC

## 2013-10-22 NOTE — Assessment & Plan Note (Signed)
10/22/13 thyroid increased by 1/2 pill Weds; TSH with lipids 2/15

## 2013-11-12 ENCOUNTER — Other Ambulatory Visit: Payer: Self-pay | Admitting: Internal Medicine

## 2013-11-12 NOTE — Telephone Encounter (Signed)
Omeprazole refilled.

## 2013-11-22 ENCOUNTER — Other Ambulatory Visit: Payer: Self-pay | Admitting: Internal Medicine

## 2013-11-22 NOTE — Telephone Encounter (Signed)
Simvastatin refilled per protocol.  

## 2013-11-24 ENCOUNTER — Telehealth: Payer: Self-pay | Admitting: *Deleted

## 2013-11-24 ENCOUNTER — Other Ambulatory Visit: Payer: Self-pay | Admitting: *Deleted

## 2013-11-24 MED ORDER — RANITIDINE HCL 300 MG PO TABS: 300.0000 mg | ORAL_TABLET | Freq: Every day | ORAL | Status: DC

## 2013-11-24 NOTE — Telephone Encounter (Signed)
Patient called and stated that she went to pick up her prescription for prilosec. She was told by the pharmacist that that zantac will work better with the medications that she is on. Patient also states that the pharmacists recommends that she has the physician strength  Which is 300mg . Patient would like to know if she can have a prescription called into the pharmacy for zantac. Please advise. SW

## 2013-11-24 NOTE — Telephone Encounter (Signed)
Protein pump inhibitors should be taken daily for only up to 8 weeks and then as needed only. Long-term use may exacerbate the risk of osteopenia or bone thinning. Reflux of gastric acid may be asymptomatic as this may occur mainly during sleep.The triggers for reflux  include stress; the "aspirin family" ; alcohol; peppermint; and caffeine (coffee, tea, cola, and chocolate). The aspirin family would include aspirin and the nonsteroidal agents such as ibuprofen &  Naproxen. Tylenol would not cause reflux. If having symptoms ; food & drink should be avoided for @ least 2 hours before going to bed.  Ranitine 300 mg q am pre breakfast prn # 30 R X2

## 2013-11-26 ENCOUNTER — Telehealth: Payer: Self-pay | Admitting: *Deleted

## 2013-11-26 NOTE — Telephone Encounter (Signed)
Called and left message for patient to please call back and let us know when her last Bone Density scan was.

## 2013-12-03 ENCOUNTER — Telehealth: Payer: Self-pay

## 2013-12-03 NOTE — Telephone Encounter (Signed)
MSG from patient advising her Bone Density was done on 07/01/11. I will update her records      KP

## 2013-12-15 ENCOUNTER — Other Ambulatory Visit: Payer: Self-pay | Admitting: Internal Medicine

## 2013-12-15 NOTE — Telephone Encounter (Signed)
Levothyroxine refilled per protocol. JG//CMA 

## 2013-12-24 ENCOUNTER — Other Ambulatory Visit: Payer: Self-pay | Admitting: Internal Medicine

## 2013-12-24 NOTE — Telephone Encounter (Signed)
Losartan refilled per protocol. JG//CMA 

## 2014-01-18 ENCOUNTER — Telehealth: Payer: Self-pay | Admitting: Internal Medicine

## 2014-01-18 MED ORDER — ALBUTEROL SULFATE HFA 108 (90 BASE) MCG/ACT IN AERS: 2.0000 | INHALATION_SPRAY | Freq: Four times a day (QID) | RESPIRATORY_TRACT | Status: DC | PRN

## 2014-01-18 NOTE — Telephone Encounter (Signed)
Spoke with pt. Aware RX has been sent. Nothing further needed 

## 2014-01-18 NOTE — Telephone Encounter (Signed)
Spoke with pt. She is requesting a refill on her albuterol inhaler. She thinks the last time she had this refilled in 2003. This is not on her med list and do not show we have refilled this for pt. Please advise Dr. Annamaria Boots thanks Last OV 03/12/13 Pending 04/15/14

## 2014-01-18 NOTE — Telephone Encounter (Signed)
Ok to Rx albuterol HFA, #1, 2 puffs  Every 4-6 hours as needed, ref prn

## 2014-02-20 ENCOUNTER — Other Ambulatory Visit: Payer: Self-pay | Admitting: Internal Medicine

## 2014-02-22 NOTE — Telephone Encounter (Signed)
Rx sent to the pharmacy by e-script.//AB/CMA 

## 2014-03-01 ENCOUNTER — Telehealth: Payer: Self-pay | Admitting: Internal Medicine

## 2014-03-01 MED ORDER — MONTELUKAST SODIUM 10 MG PO TABS: 10.0000 mg | ORAL_TABLET | Freq: Every day | ORAL | Status: DC

## 2014-03-01 NOTE — Telephone Encounter (Signed)
Pt has upcoming appointment with CY in 04/2014. Rx has been sent in to last until this appointment. Pt is aware. Nothing further is needed.

## 2014-03-09 ENCOUNTER — Ambulatory Visit (INDEPENDENT_AMBULATORY_CARE_PROVIDER_SITE_OTHER): Payer: Medicare PPO

## 2014-03-09 DIAGNOSIS — J309 Allergic rhinitis, unspecified: Secondary | ICD-10-CM

## 2014-03-10 ENCOUNTER — Other Ambulatory Visit: Payer: Self-pay | Admitting: *Deleted

## 2014-03-10 NOTE — Telephone Encounter (Signed)
LMOM (11:59am) asking the pt to RTC regarding med refill request.  Pt is needing labs.//AB/CMA

## 2014-03-11 ENCOUNTER — Telehealth: Payer: Self-pay | Admitting: *Deleted

## 2014-03-11 MED ORDER — SIMVASTATIN 40 MG PO TABS: ORAL_TABLET | ORAL | Status: DC

## 2014-03-11 NOTE — Telephone Encounter (Signed)
LMOM (8:16am) asking the pt to RTC regarding med refill request.  Pt needs labs.//AB/CMA

## 2014-03-11 NOTE — Telephone Encounter (Signed)
Patient phoned triage line at [phone] and [phone] returning Angie's phone call.  CB# [phone]

## 2014-03-11 NOTE — Telephone Encounter (Signed)
Rx sent to the pharmacy by e-script.  Spoke with the pt and informed her that she will need to come in for fasting lipids and TSH.  Pt stated that she will be having eye surgery,but as soon as she can she will come in for the labs.//AB/CMA

## 2014-03-14 NOTE — Telephone Encounter (Signed)
See refill encounter for (03-10-14).//AB/CMA

## 2014-04-05 ENCOUNTER — Ambulatory Visit: Payer: Medicare PPO | Admitting: Internal Medicine

## 2014-05-05 ENCOUNTER — Encounter: Payer: Self-pay | Admitting: Internal Medicine

## 2014-05-05 ENCOUNTER — Ambulatory Visit (INDEPENDENT_AMBULATORY_CARE_PROVIDER_SITE_OTHER): Payer: Medicare PPO | Admitting: Internal Medicine

## 2014-05-05 VITALS — BP 122/68 | HR 80 | Ht 65.0 in | Wt 171.0 lb

## 2014-05-05 DIAGNOSIS — J309 Allergic rhinitis, unspecified: Secondary | ICD-10-CM

## 2014-05-05 DIAGNOSIS — J45909 Unspecified asthma, uncomplicated: Secondary | ICD-10-CM

## 2014-05-05 DIAGNOSIS — J3089 Other allergic rhinitis: Secondary | ICD-10-CM

## 2014-05-05 DIAGNOSIS — J302 Other seasonal allergic rhinitis: Secondary | ICD-10-CM
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
## 2014-05-05 NOTE — Patient Instructions (Signed)
Sample Dymista nasal spray   1-2 puffs each nostril once daily while needed   When you use this up, you can go back to Flonase, same directions  We can continue allergy vaccine 1:10 GO

## 2014-05-17 ENCOUNTER — Other Ambulatory Visit (INDEPENDENT_AMBULATORY_CARE_PROVIDER_SITE_OTHER): Payer: Medicare PPO

## 2014-05-17 DIAGNOSIS — E039 Hypothyroidism, unspecified: Secondary | ICD-10-CM

## 2014-05-17 DIAGNOSIS — E782 Mixed hyperlipidemia: Secondary | ICD-10-CM

## 2014-05-17 LAB — LIPID PANEL
Cholesterol: 168 mg/dL (ref 0–200)
HDL: 55.4 mg/dL (ref >39.00)
LDL Cholesterol: 53 mg/dL (ref 0–99)
Total CHOL/HDL Ratio: 3
Triglycerides: 296 mg/dL — ABNORMAL HIGH (ref 0.0–149.0)
VLDL: 59.2 mg/dL — ABNORMAL HIGH (ref 0.0–40.0)

## 2014-05-17 LAB — TSH: TSH: 4.67 u[IU]/mL — ABNORMAL HIGH (ref 0.35–4.50)

## 2014-05-30 ENCOUNTER — Encounter: Payer: Self-pay | Admitting: Internal Medicine

## 2014-05-31 ENCOUNTER — Other Ambulatory Visit: Payer: Self-pay | Admitting: Internal Medicine

## 2014-05-31 MED ORDER — MONTELUKAST SODIUM 10 MG PO TABS: 10.0000 mg | ORAL_TABLET | Freq: Every day | ORAL | Status: DC

## 2014-06-03 ENCOUNTER — Other Ambulatory Visit: Payer: Self-pay | Admitting: Internal Medicine

## 2014-06-12 NOTE — Assessment & Plan Note (Signed)
Asthma controlled with no recent wheezing

## 2014-06-12 NOTE — Assessment & Plan Note (Signed)
Fair control, continuing allergy vaccine at 1:10 GO Plan-sample Dymista with discussion

## 2014-07-07 ENCOUNTER — Telehealth: Payer: Self-pay | Admitting: Internal Medicine

## 2014-07-07 ENCOUNTER — Other Ambulatory Visit: Payer: Self-pay | Admitting: Internal Medicine

## 2014-07-07 MED ORDER — BUDESONIDE 180 MCG/ACT IN AEPB
2.0000 | INHALATION_SPRAY | Freq: Two times a day (BID) | RESPIRATORY_TRACT | Status: DC
Start: 2014-07-07 — End: 2015-05-11

## 2014-07-07 MED ORDER — "TUBERCULIN SYRINGE 25G X 5/8"" 1 ML MISC": Status: DC

## 2014-07-07 NOTE — Telephone Encounter (Signed)
Called and spoke with the pharmacy and they stated that the pt is requesting the pulmicort should have been a 3 month supply so this was changed to #3 with 1 refill.    They pharmacy stated that the syringes rx that they got were for the 1 ml but they were only able to get the 3 ml syringes in but with the same gauge needle, etc.  Given the ok to change the rx for this.  Nothing further is needed.

## 2014-07-13 IMAGING — CR DG KNEE COMPLETE 4+V*R*
4 series · 4 of 4 positions shown · non-contrast
Comparison: None.

CLINICAL DATA: Laceration post fall.

RIGHT KNEE - COMPLETE 4+ VIEW

[t knee ap right]
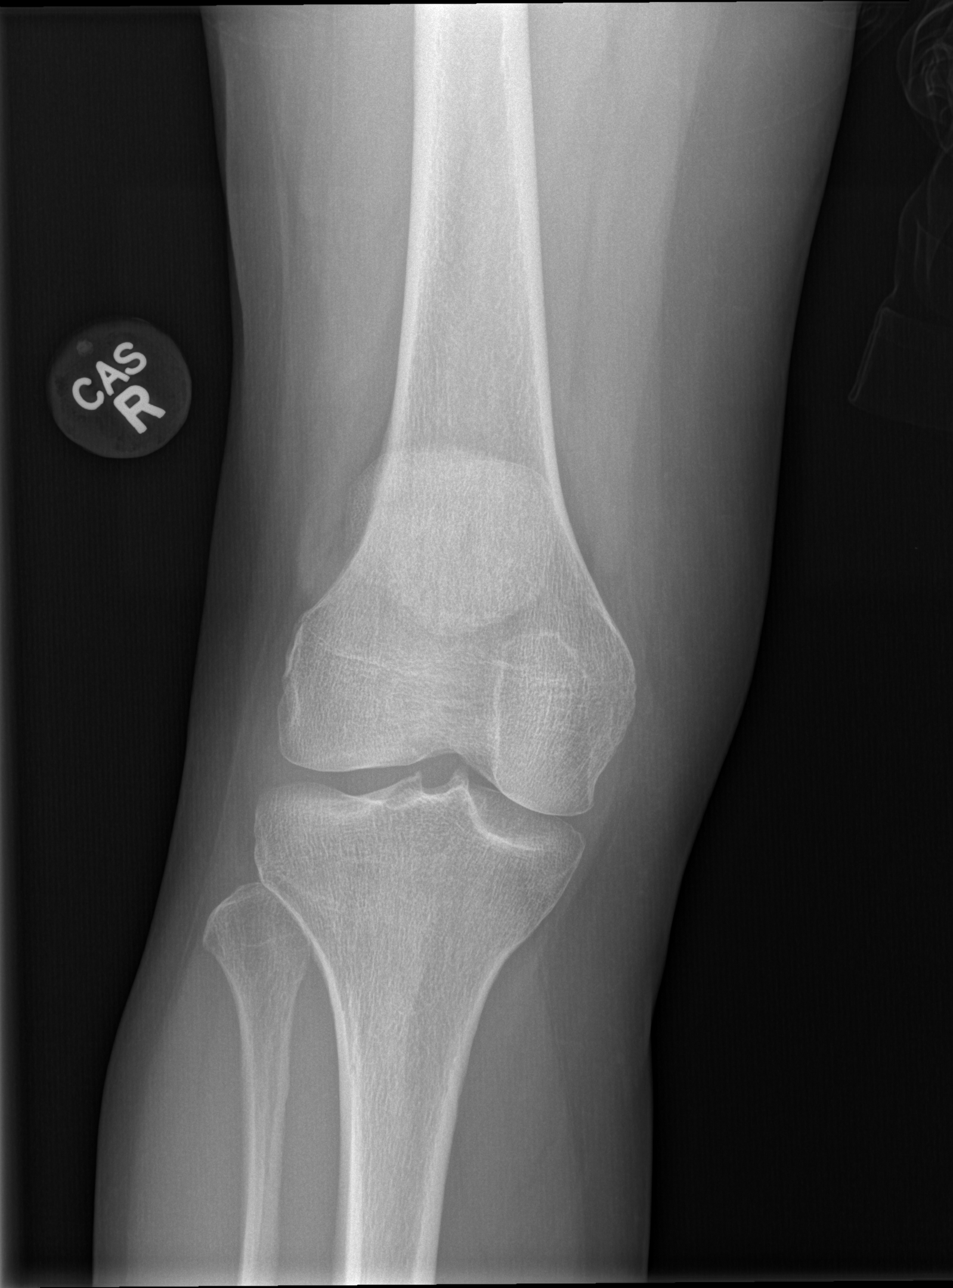

[t knee obl right (1 of 2)]
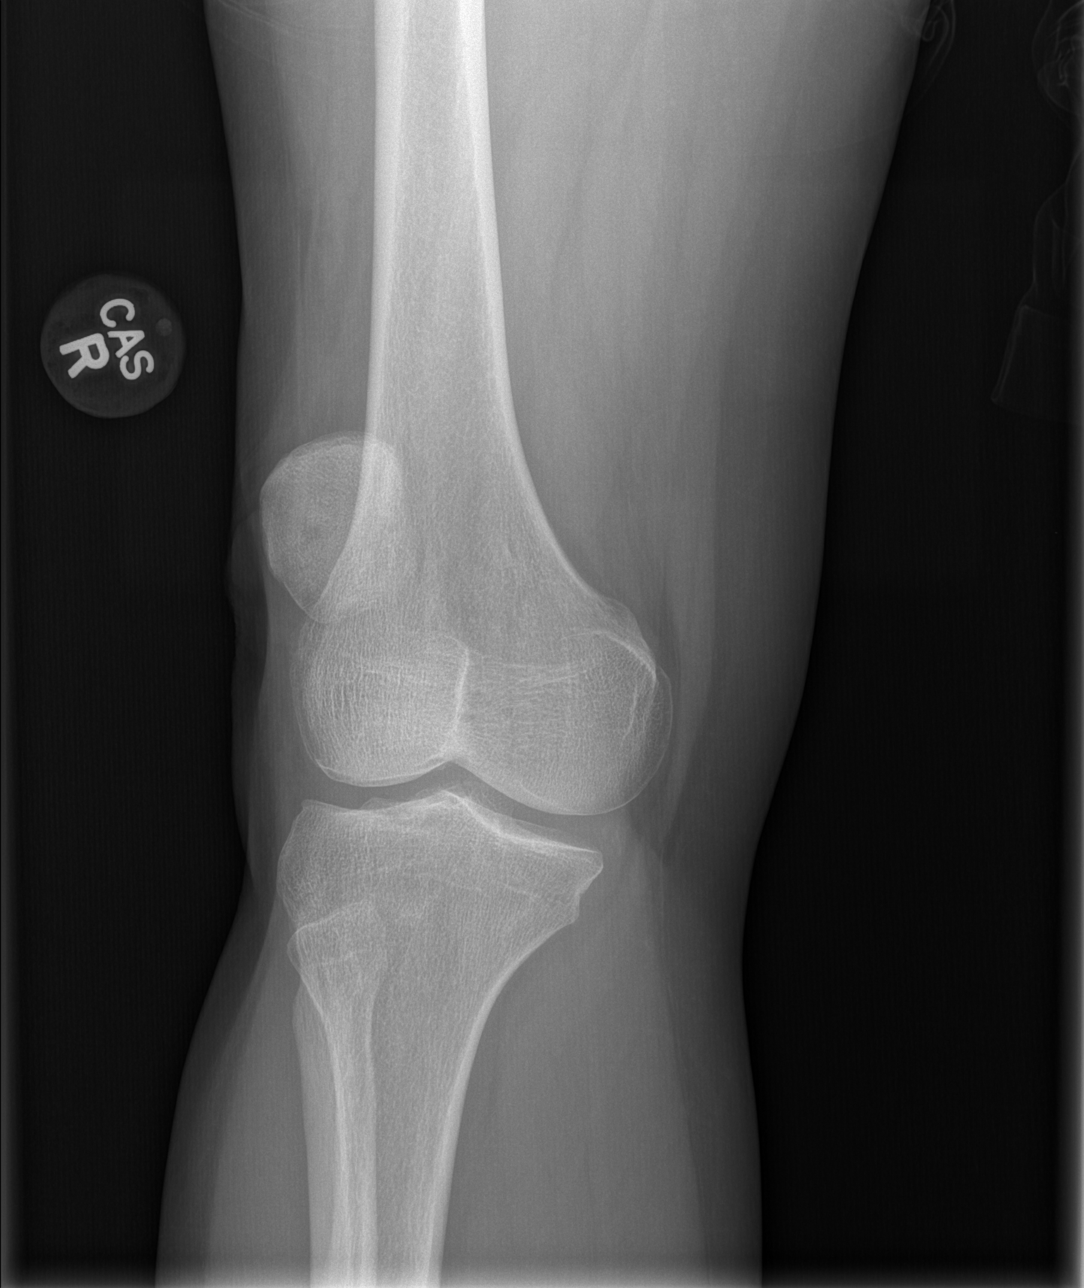

[t knee obl right (2 of 2)]
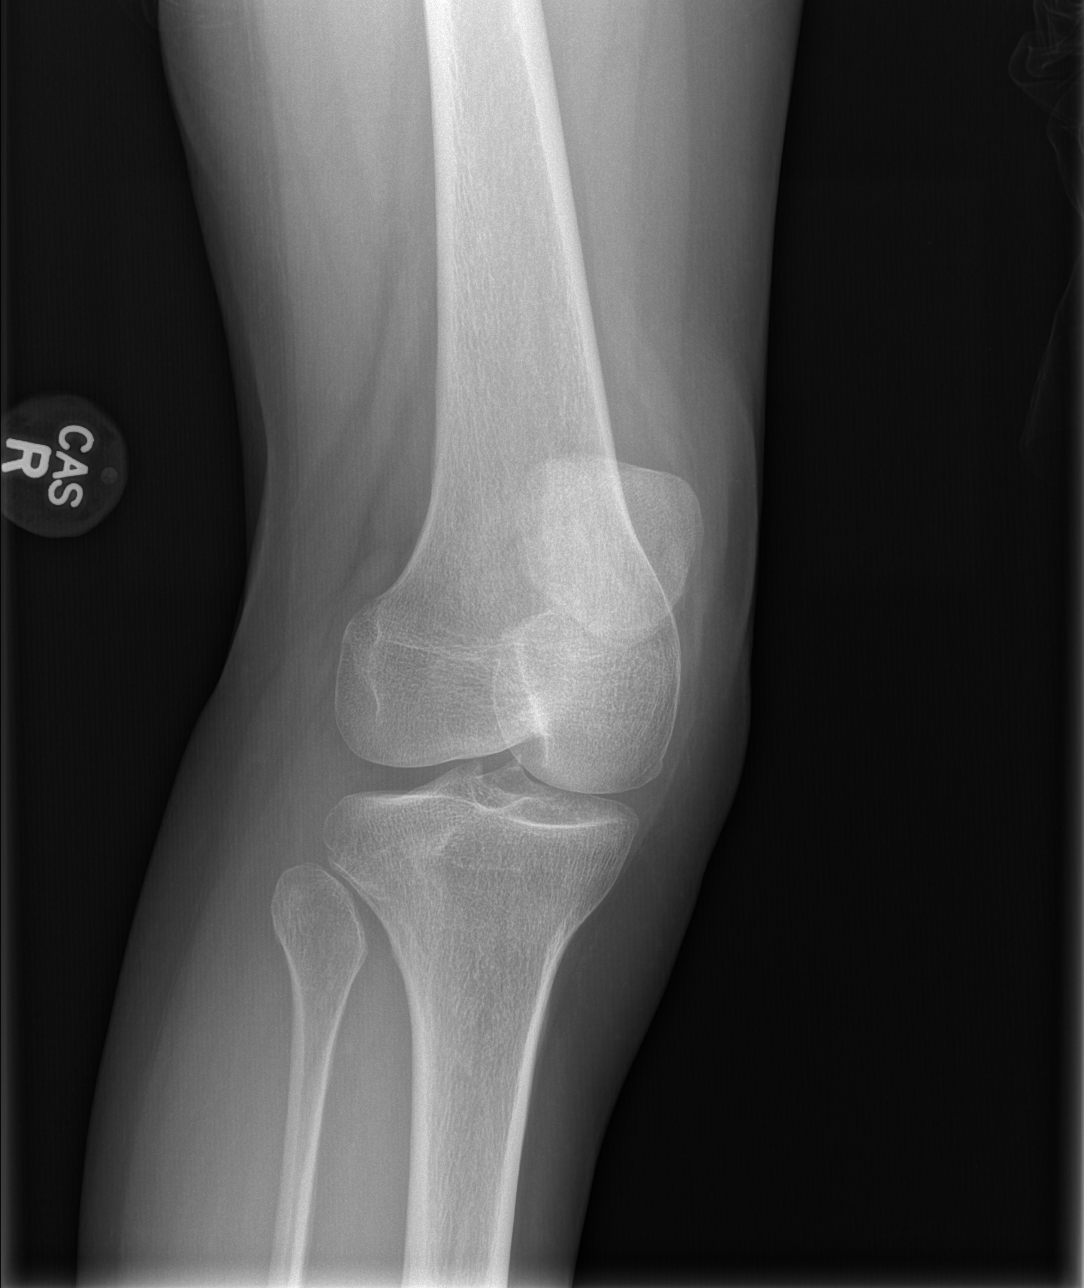

[t knee lat right]
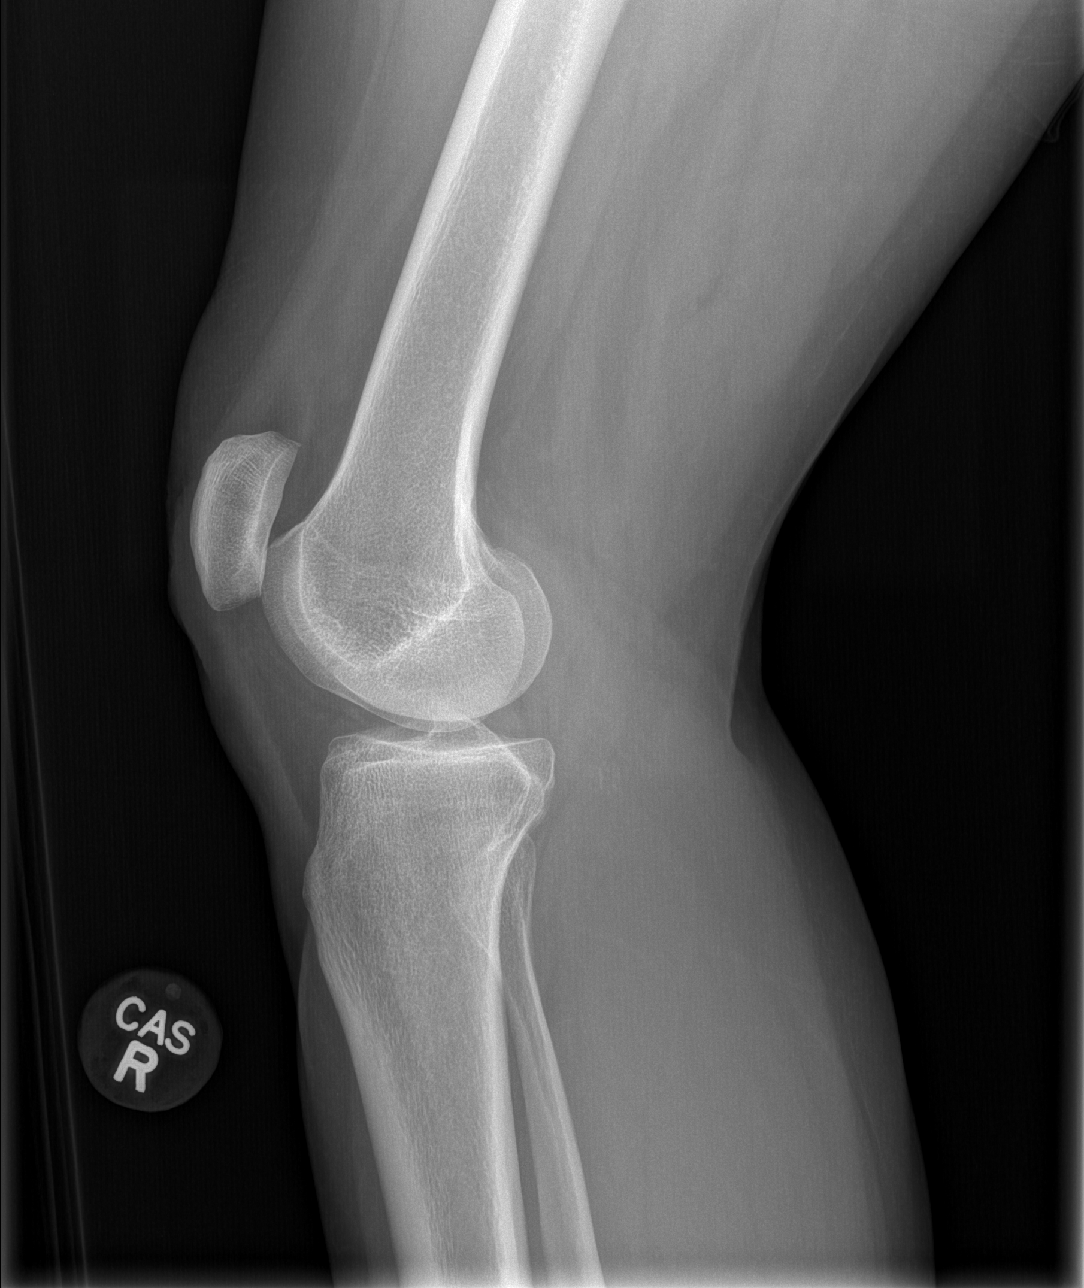

[4 of 4 positions shown; findings below may reference images not displayed]

FINDINGS: No effusion. Negative for fracture, dislocation, or other
acute abnormality.  Normal alignment and mineralization. No
significant degenerative change.  Regional soft tissues
unremarkable.
IMPRESSION: Negative

## 2014-08-03 ENCOUNTER — Other Ambulatory Visit: Payer: Self-pay | Admitting: Internal Medicine

## 2014-08-05 ENCOUNTER — Ambulatory Visit (INDEPENDENT_AMBULATORY_CARE_PROVIDER_SITE_OTHER): Payer: Medicare PPO

## 2014-08-05 DIAGNOSIS — J309 Allergic rhinitis, unspecified: Secondary | ICD-10-CM

## 2014-09-02 ENCOUNTER — Other Ambulatory Visit: Payer: Self-pay | Admitting: Internal Medicine

## 2014-09-28 ENCOUNTER — Other Ambulatory Visit: Payer: Self-pay | Admitting: Obstetrics and Gynecology

## 2014-09-29 LAB — CYTOLOGY - PAP

## 2014-10-12 LAB — HM DEXA SCAN

## 2014-10-24 ENCOUNTER — Encounter: Payer: Medicare PPO | Admitting: Internal Medicine

## 2014-10-25 ENCOUNTER — Ambulatory Visit (INDEPENDENT_AMBULATORY_CARE_PROVIDER_SITE_OTHER): Payer: Medicare PPO | Admitting: Internal Medicine

## 2014-10-25 ENCOUNTER — Encounter: Payer: Self-pay | Admitting: Internal Medicine

## 2014-10-25 ENCOUNTER — Other Ambulatory Visit (INDEPENDENT_AMBULATORY_CARE_PROVIDER_SITE_OTHER): Payer: Medicare PPO

## 2014-10-25 VITALS — BP 116/66 | HR 74 | Temp 97.8°F | Resp 16 | Ht 65.0 in | Wt 169.0 lb

## 2014-10-25 DIAGNOSIS — E782 Mixed hyperlipidemia: Secondary | ICD-10-CM

## 2014-10-25 DIAGNOSIS — R739 Hyperglycemia, unspecified: Secondary | ICD-10-CM

## 2014-10-25 DIAGNOSIS — I10 Essential (primary) hypertension: Secondary | ICD-10-CM

## 2014-10-25 DIAGNOSIS — E038 Other specified hypothyroidism: Secondary | ICD-10-CM

## 2014-10-25 DIAGNOSIS — K219 Gastro-esophageal reflux disease without esophagitis: Secondary | ICD-10-CM

## 2014-10-25 DIAGNOSIS — Z0000 Encounter for general adult medical examination without abnormal findings: Secondary | ICD-10-CM

## 2014-10-25 LAB — CBC WITH DIFFERENTIAL/PLATELET
Basophils Absolute: 0.1 10*3/uL (ref 0.0–0.1)
Basophils Relative: 1 % (ref 0.0–3.0)
EOS ABS: 0.3 10*3/uL (ref 0.0–0.7)
EOS PCT: 6.9 % — AB (ref 0.0–5.0)
HCT: 43.1 % (ref 36.0–46.0)
Hemoglobin: 14.3 g/dL (ref 12.0–15.0)
LYMPHS ABS: 0.8 10*3/uL (ref 0.7–4.0)
Lymphocytes Relative: 16.6 % (ref 12.0–46.0)
MCHC: 33.2 g/dL (ref 30.0–36.0)
MCV: 86.4 fl (ref 78.0–100.0)
MONO ABS: 0.4 10*3/uL (ref 0.1–1.0)
Monocytes Relative: 8.4 % (ref 3.0–12.0)
NEUTROS PCT: 67.1 % (ref 43.0–77.0)
Neutro Abs: 3.4 10*3/uL (ref 1.4–7.7)
PLATELETS: 388 10*3/uL (ref 150.0–400.0)
RBC: 4.99 Mil/uL (ref 3.87–5.11)
RDW: 13.3 % (ref 11.5–15.5)
WBC: 5 10*3/uL (ref 4.0–10.5)

## 2014-10-25 LAB — COMPREHENSIVE METABOLIC PANEL
ALBUMIN: 3.5 g/dL (ref 3.5–5.2)
ALK PHOS: 66 U/L (ref 39–117)
ALT: 31 U/L (ref 0–35)
AST: 30 U/L (ref 0–37)
BUN: 22 mg/dL (ref 6–23)
CO2: 27 mEq/L (ref 19–32)
Calcium: 9.6 mg/dL (ref 8.4–10.5)
Chloride: 105 mEq/L (ref 96–112)
Creatinine, Ser: 1.1 mg/dL (ref 0.4–1.2)
GFR: 50.46 mL/min — ABNORMAL LOW (ref >60.00)
GLUCOSE: 104 mg/dL — AB (ref 70–99)
POTASSIUM: 4.2 meq/L (ref 3.5–5.1)
Sodium: 139 mEq/L (ref 135–145)
Total Bilirubin: 0.6 mg/dL (ref 0.2–1.2)
Total Protein: 7 g/dL (ref 6.0–8.3)

## 2014-10-25 LAB — LIPID PANEL
CHOLESTEROL: 159 mg/dL (ref 0–200)
HDL: 47 mg/dL (ref >39.00)
NonHDL: 112
Total CHOL/HDL Ratio: 3
Triglycerides: 219 mg/dL — ABNORMAL HIGH (ref 0.0–149.0)
VLDL: 43.8 mg/dL — ABNORMAL HIGH (ref 0.0–40.0)

## 2014-10-25 LAB — LDL CHOLESTEROL, DIRECT: LDL DIRECT: 86.4 mg/dL

## 2014-10-25 LAB — HEMOGLOBIN A1C: HEMOGLOBIN A1C: 5.9 % (ref 4.6–6.5)

## 2014-10-25 LAB — TSH: TSH: 1.67 u[IU]/mL (ref 0.35–4.50)

## 2014-10-25 MED ORDER — RANITIDINE HCL 300 MG PO TABS: ORAL_TABLET | ORAL | Status: DC

## 2014-10-25 MED ORDER — LEVOTHYROXINE SODIUM 100 MCG PO TABS: 100.0000 ug | ORAL_TABLET | Freq: Every day | ORAL | Status: DC

## 2014-10-25 MED ORDER — MONTELUKAST SODIUM 10 MG PO TABS: 10.0000 mg | ORAL_TABLET | Freq: Every day | ORAL | Status: DC

## 2014-10-25 MED ORDER — RALOXIFENE HCL 60 MG PO TABS: 60.0000 mg | ORAL_TABLET | Freq: Every day | ORAL | Status: DC

## 2014-10-25 MED ORDER — LOSARTAN POTASSIUM 100 MG PO TABS: ORAL_TABLET | ORAL | Status: DC

## 2014-10-25 MED ORDER — SIMVASTATIN 40 MG PO TABS: ORAL_TABLET | ORAL | Status: DC

## 2014-10-25 NOTE — Assessment & Plan Note (Signed)
I will recheck her TSH and will adjust her dose if needed 

## 2014-10-25 NOTE — Progress Notes (Signed)
Pre visit review using our clinic review tool, if applicable. No additional management support is needed unless otherwise documented below in the visit note. 

## 2014-10-25 NOTE — Patient Instructions (Signed)
Hypothyroidism The thyroid is a large gland located in the lower front of your neck. The thyroid gland helps control metabolism. Metabolism is how your body handles food. It controls metabolism with the hormone thyroxine. When this gland is underactive (hypothyroid), it produces too little hormone.  CAUSES These include:   Absence or destruction of thyroid tissue.  Goiter due to iodine deficiency.  Goiter due to medications.  Congenital defects (since birth).  Problems with the pituitary. This causes a lack of TSH (thyroid stimulating hormone). This hormone tells the thyroid to turn out more hormone. SYMPTOMS  Lethargy (feeling as though you have no energy)  Cold intolerance  Weight gain (in spite of normal food intake)  Dry skin  Coarse hair  Menstrual irregularity (if severe, may lead to infertility)  Slowing of thought processes Cardiac problems are also caused by insufficient amounts of thyroid hormone. Hypothyroidism in the newborn is cretinism, and is an extreme form. It is important that this form be treated adequately and immediately or it will lead rapidly to retarded physical and mental development. DIAGNOSIS  To prove hypothyroidism, your caregiver may do blood tests and ultrasound tests. Sometimes the signs are hidden. It may be necessary for your caregiver to watch this illness with blood tests either before or after diagnosis and treatment. TREATMENT  Low levels of thyroid hormone are increased by using synthetic thyroid hormone. This is a safe, effective treatment. It usually takes about four weeks to gain the full effects of the medication. After you have the full effect of the medication, it will generally take another four weeks for problems to leave. Your caregiver may start you on low doses. If you have had heart problems the dose may be gradually increased. It is generally not an emergency to get rapidly to normal. HOME CARE INSTRUCTIONS   Take your  medications as your caregiver suggests. Let your caregiver know of any medications you are taking or start taking. Your caregiver will help you with dosage schedules.  As your condition improves, your dosage needs may increase. It will be necessary to have continuing blood tests as suggested by your caregiver.  Report all suspected medication side effects to your caregiver. SEEK MEDICAL CARE IF: Seek medical care if you develop:  Sweating.  Tremulousness (tremors).  Anxiety.  Rapid weight loss.  Heat intolerance.  Emotional swings.  Diarrhea.  Weakness. SEEK IMMEDIATE MEDICAL CARE IF:  You develop chest pain, an irregular heart beat (palpitations), or a rapid heart beat. MAKE SURE YOU:   Understand these instructions.  Will watch your condition.  Will get help right away if you are not doing well or get worse. Document Released: 12/16/2005 Document Revised: 03/09/2012 Document Reviewed: 08/05/2008 Exit[employer] Patient Information 2015 [employer]. This information is not intended to replace advice given to you by your health care provider. Make sure you discuss any questions you have with your health care provider.  

## 2014-10-25 NOTE — Assessment & Plan Note (Signed)
Her BP is well controlled I will monitor her lytes and renal function 

## 2014-10-25 NOTE — Assessment & Plan Note (Signed)
I will check her A1C to see if she has developed DM2 

## 2014-10-25 NOTE — Addendum Note (Signed)
Addended by: Janith Lima on: 10/25/2014 07:43 PM   Modules accepted: Orders

## 2014-10-25 NOTE — Progress Notes (Signed)
Subjective:    Patient ID: Natalie Shannon, female    DOB: Feb 16, 1941, 72 y.o.   MRN: [id number]  Thyroid Problem Presents for follow-up visit. Symptoms include fatigue. Patient reports no anxiety, cold intolerance, constipation, depressed mood, diaphoresis, diarrhea, dry skin, hair loss, heat intolerance, hoarse voice, leg swelling, nail problem, palpitations, tremors, visual change, weight gain or weight loss. The symptoms have been stable. Past treatments include levothyroxine. The treatment provided moderate relief. There is no history of atrial fibrillation, dementia, diabetes, Graves' ophthalmopathy, heart failure, hyperlipidemia, neuropathy, obesity or osteopenia. There are no known risk factors.      Review of Systems  Constitutional: Positive for fatigue. Negative for fever, chills, weight loss, weight gain, diaphoresis, activity change, appetite change and unexpected weight change.  HENT: Negative.  Negative for hoarse voice.   Eyes: Negative.   Respiratory: Negative.  Negative for cough, choking, chest tightness, shortness of breath and stridor.   Cardiovascular: Negative.  Negative for chest pain, palpitations and leg swelling.  Gastrointestinal: Negative.  Negative for nausea, vomiting, abdominal pain, diarrhea and constipation.  Endocrine: Negative.  Negative for cold intolerance and heat intolerance.  Genitourinary: Negative.   Musculoskeletal: Positive for arthralgias. Negative for back pain, gait problem, joint swelling, myalgias, neck pain and neck stiffness.  Skin: Negative.   Allergic/Immunologic: Negative.   Neurological: Negative.  Negative for tremors.  Hematological: Negative.  Negative for adenopathy. Does not bruise/bleed easily.  Psychiatric/Behavioral: Negative.        Objective:   Physical Exam  Vitals reviewed. Constitutional: She is oriented to person, place, and time. She appears well-developed and well-nourished. No distress.  HENT:  Head:  Normocephalic and atraumatic.  Mouth/Throat: Oropharynx is clear and moist. No oropharyngeal exudate.  Eyes: Conjunctivae are normal. Right eye exhibits no discharge. Left eye exhibits no discharge. No scleral icterus.  Neck: Normal range of motion. Neck supple. No JVD present. No tracheal deviation present. No thyromegaly present.  Cardiovascular: Normal rate, regular rhythm, normal heart sounds and intact distal pulses.  Exam reveals no gallop and no friction rub.   No murmur heard. Pulmonary/Chest: Breath sounds normal. No stridor. No respiratory distress. She has no wheezes. She has no rales. She exhibits no tenderness.  Abdominal: Soft. Bowel sounds are normal. She exhibits no distension and no mass. There is no tenderness. There is no rebound and no guarding.  Musculoskeletal: Normal range of motion. She exhibits no edema and no tenderness.  Lymphadenopathy:    She has no cervical adenopathy.  Neurological: She is oriented to person, place, and time.  Skin: Skin is warm and dry. No rash noted. She is not diaphoretic. No erythema. No pallor.  Psychiatric: She has a normal mood and affect. Her behavior is normal. Judgment and thought content normal.     Lab Results  Component Value Date   WBC 5.4 10/21/2013   HGB 13.9 10/21/2013   HCT 41.1 10/21/2013   PLT 320.0 10/21/2013   GLUCOSE 96 10/21/2013   CHOL 168 05/17/2014   TRIG 296.0* 05/17/2014   HDL 55.40 05/17/2014   LDLDIRECT 99.6 10/21/2013   LDLCALC 53 05/17/2014   ALT 32 10/21/2013   AST 31 10/21/2013   NA 141 10/21/2013   K 4.0 10/21/2013   CL 105 10/21/2013   CREATININE 1.0 10/21/2013   BUN 23 10/21/2013   CO2 27 10/21/2013   TSH 4.67* 05/17/2014   INR 1.01 05/15/2012   HGBA1C 6.0 10/19/2012       Assessment & Plan:

## 2014-10-25 NOTE — Assessment & Plan Note (Signed)

## 2014-10-26 ENCOUNTER — Encounter: Payer: Self-pay | Admitting: Internal Medicine

## 2014-10-26 ENCOUNTER — Telehealth: Payer: Self-pay | Admitting: Internal Medicine

## 2014-10-26 NOTE — Telephone Encounter (Signed)
emmi mailed  °

## 2014-11-04 ENCOUNTER — Encounter: Payer: Self-pay | Admitting: Internal Medicine

## 2015-02-27 ENCOUNTER — Ambulatory Visit: Payer: Medicare PPO | Admitting: Internal Medicine

## 2015-02-28 ENCOUNTER — Ambulatory Visit (INDEPENDENT_AMBULATORY_CARE_PROVIDER_SITE_OTHER): Payer: Medicare PPO

## 2015-02-28 DIAGNOSIS — J309 Allergic rhinitis, unspecified: Secondary | ICD-10-CM

## 2015-03-23 ENCOUNTER — Telehealth: Payer: Self-pay | Admitting: Internal Medicine

## 2015-03-23 NOTE — Telephone Encounter (Signed)
Pt advised at 10/25/14 to return in four months for a regular 4 month follow up on hypertension.

## 2015-03-23 NOTE — Telephone Encounter (Signed)
Patient states she takes her BP at home and she feels it runs normal.  She does not want to make a follow up appointment at this time. Patient states she is seeing Dr. Annamaria Boots in a few weeks.

## 2015-03-23 NOTE — Telephone Encounter (Signed)
States Dr. Ronnald Ramp was wanting her to come back in for an appointment.  She would like a response in regards to why she needs to come in.

## 2015-05-11 ENCOUNTER — Ambulatory Visit (INDEPENDENT_AMBULATORY_CARE_PROVIDER_SITE_OTHER): Payer: Medicare PPO | Admitting: Internal Medicine

## 2015-05-11 ENCOUNTER — Encounter: Payer: Self-pay | Admitting: Internal Medicine

## 2015-05-11 VITALS — BP 120/76 | HR 73 | Ht 65.0 in | Wt 168.0 lb

## 2015-05-11 DIAGNOSIS — J4520 Mild intermittent asthma, uncomplicated: Secondary | ICD-10-CM

## 2015-05-11 DIAGNOSIS — H1013 Acute atopic conjunctivitis, bilateral: Secondary | ICD-10-CM | POA: Diagnosis not present

## 2015-05-11 DIAGNOSIS — J309 Allergic rhinitis, unspecified: Secondary | ICD-10-CM

## 2015-05-11 MED ORDER — ALBUTEROL SULFATE HFA 108 (90 BASE) MCG/ACT IN AERS: 2.0000 | INHALATION_SPRAY | Freq: Four times a day (QID) | RESPIRATORY_TRACT | Status: DC | PRN

## 2015-05-11 MED ORDER — MONTELUKAST SODIUM 10 MG PO TABS: 10.0000 mg | ORAL_TABLET | Freq: Every day | ORAL | Status: DC

## 2015-05-11 MED ORDER — BUDESONIDE 180 MCG/ACT IN AEPB: INHALATION_SPRAY | RESPIRATORY_TRACT | Status: DC
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
## 2015-05-11 NOTE — Patient Instructions (Signed)
Refill scripts sent for albuterol rescue inhaler. Budesonide (pulmicort) and montelukast (Singulair)  We can continue allergy vaccine  Please call as needed

## 2015-05-30 LAB — HM MAMMOGRAPHY

## 2015-06-03 DIAGNOSIS — H1010 Acute atopic conjunctivitis, unspecified eye: Secondary | ICD-10-CM | POA: Insufficient documentation

## 2015-06-03 DIAGNOSIS — J309 Allergic rhinitis, unspecified: Secondary | ICD-10-CM

## 2015-06-03 NOTE — Assessment & Plan Note (Signed)
Seasonal exacerbation should be coming to an end. We discussed available OTC eyedrops and antihistamines.

## 2015-06-03 NOTE — Assessment & Plan Note (Signed)
She feels allergy vaccine still helps but we discussed when to quit. Insurance is changing to generic budesonide-discussed

## 2015-06-05 NOTE — Addendum Note (Signed)
Addended by: Janith Lima on: 06/05/2015 08:21 AM   Modules accepted: Miquel Dunn

## 2015-08-10 ENCOUNTER — Other Ambulatory Visit: Payer: Self-pay | Admitting: Internal Medicine

## 2015-08-14 ENCOUNTER — Telehealth: Payer: Self-pay | Admitting: *Deleted

## 2015-08-14 DIAGNOSIS — E038 Other specified hypothyroidism: Secondary | ICD-10-CM

## 2015-08-14 MED ORDER — SIMVASTATIN 40 MG PO TABS: ORAL_TABLET | ORAL | Status: DC

## 2015-08-14 MED ORDER — RALOXIFENE HCL 60 MG PO TABS: 60.0000 mg | ORAL_TABLET | Freq: Every day | ORAL | Status: DC

## 2015-08-14 MED ORDER — LOSARTAN POTASSIUM 100 MG PO TABS: ORAL_TABLET | ORAL | Status: DC

## 2015-08-14 MED ORDER — RANITIDINE HCL 300 MG PO TABS: ORAL_TABLET | ORAL | Status: DC

## 2015-08-14 MED ORDER — LEVOTHYROXINE SODIUM 100 MCG PO TABS: 100.0000 ug | ORAL_TABLET | Freq: Every day | ORAL | Status: DC

## 2015-08-14 MED ORDER — BUDESONIDE 180 MCG/ACT IN AEPB: INHALATION_SPRAY | RESPIRATORY_TRACT | Status: DC

## 2015-08-14 NOTE — Telephone Encounter (Signed)
Received call pt states her refills was denied, but her appt is not scheduled to 10/30/15. Requesting refill until appt. Inform pt can only send enough until appt. Verified pharmacy inform her will send to [facility]...Johny Chess

## 2015-08-22 ENCOUNTER — Encounter: Payer: Self-pay | Admitting: Internal Medicine

## 2015-09-28 DIAGNOSIS — L820 Inflamed seborrheic keratosis: Secondary | ICD-10-CM | POA: Diagnosis not present

## 2015-09-28 DIAGNOSIS — L719 Rosacea, unspecified: Secondary | ICD-10-CM | POA: Diagnosis not present

## 2015-09-28 DIAGNOSIS — L821 Other seborrheic keratosis: Secondary | ICD-10-CM | POA: Diagnosis not present

## 2015-09-28 DIAGNOSIS — D1800 Hemangioma unspecified site: Secondary | ICD-10-CM | POA: Diagnosis not present

## 2015-09-28 DIAGNOSIS — Z23 Encounter for immunization: Secondary | ICD-10-CM | POA: Diagnosis not present

## 2015-09-28 DIAGNOSIS — L814 Other melanin hyperpigmentation: Secondary | ICD-10-CM | POA: Diagnosis not present

## 2015-09-28 DIAGNOSIS — L304 Erythema intertrigo: Secondary | ICD-10-CM | POA: Diagnosis not present

## 2015-10-02 ENCOUNTER — Other Ambulatory Visit: Payer: Self-pay | Admitting: Obstetrics and Gynecology

## 2015-10-02 DIAGNOSIS — Z124 Encounter for screening for malignant neoplasm of cervix: Secondary | ICD-10-CM | POA: Diagnosis not present

## 2015-10-02 DIAGNOSIS — Z6828 Body mass index (BMI) 28.0-28.9, adult: Secondary | ICD-10-CM | POA: Diagnosis not present

## 2015-10-03 LAB — CYTOLOGY - PAP

## 2015-10-12 ENCOUNTER — Telehealth: Payer: Self-pay

## 2015-10-12 NOTE — Telephone Encounter (Signed)
Call to fup on AWV prior to CPE of 10/31 at 1:15 and LMV for Ms. Riojas to call back

## 2015-10-13 NOTE — Telephone Encounter (Signed)
Patient called to educate on Medicare Wellness apt. LVM for the patient to call back to educate and schedule for wellness visit.  Has apt 10/31 at 1:15 for CPE and seeing if she can come in on alternative day for AWV

## 2015-10-13 NOTE — Telephone Encounter (Signed)
Spoke at length about asthma; and ongoing care; avoids doctor's offices and other crowded areas due to pulmonary hx. Declines AWV at this time. Reviewed o/d but states she has had her pneumonia series;

## 2015-10-30 ENCOUNTER — Ambulatory Visit (INDEPENDENT_AMBULATORY_CARE_PROVIDER_SITE_OTHER): Payer: Medicare PPO | Admitting: Internal Medicine

## 2015-10-30 ENCOUNTER — Encounter: Payer: Self-pay | Admitting: Internal Medicine

## 2015-10-30 VITALS — BP 126/88 | HR 73 | Temp 97.7°F | Resp 16 | Ht 65.0 in | Wt 168.0 lb

## 2015-10-30 DIAGNOSIS — R739 Hyperglycemia, unspecified: Secondary | ICD-10-CM | POA: Diagnosis not present

## 2015-10-30 DIAGNOSIS — E038 Other specified hypothyroidism: Secondary | ICD-10-CM | POA: Diagnosis not present

## 2015-10-30 DIAGNOSIS — I10 Essential (primary) hypertension: Secondary | ICD-10-CM

## 2015-10-30 DIAGNOSIS — E782 Mixed hyperlipidemia: Secondary | ICD-10-CM | POA: Diagnosis not present

## 2015-10-30 DIAGNOSIS — Z0000 Encounter for general adult medical examination without abnormal findings: Secondary | ICD-10-CM | POA: Diagnosis not present

## 2015-10-30 MED ORDER — LOSARTAN POTASSIUM 100 MG PO TABS: ORAL_TABLET | ORAL | Status: DC

## 2015-10-30 MED ORDER — SIMVASTATIN 40 MG PO TABS: ORAL_TABLET | ORAL | Status: DC

## 2015-10-30 MED ORDER — RALOXIFENE HCL 60 MG PO TABS: 60.0000 mg | ORAL_TABLET | Freq: Every day | ORAL | Status: DC

## 2015-10-30 MED ORDER — RANITIDINE HCL 300 MG PO TABS: ORAL_TABLET | ORAL | Status: DC

## 2015-10-30 MED ORDER — BUDESONIDE 180 MCG/ACT IN AEPB: INHALATION_SPRAY | RESPIRATORY_TRACT | Status: DC

## 2015-10-30 MED ORDER — LEVOTHYROXINE SODIUM 100 MCG PO TABS: 100.0000 ug | ORAL_TABLET | Freq: Every day | ORAL | Status: DC

## 2015-10-30 NOTE — Progress Notes (Signed)
Subjective:  Patient ID: Natalie Shannon, female    DOB: 18-Nov-1941  Age: 74 y.o. MRN: [id number]  CC: Hypothyroidism and Annual Exam   HPI Natalie Shannon presents for a CPX as well as check-up on hypothyroidism. She feels well and offers no complaints.  Outpatient Prescriptions Prior to Visit  Medication Sig Dispense Refill  . albuterol (PROVENTIL HFA;VENTOLIN HFA) 108 (90 BASE) MCG/ACT inhaler Inhale 2 puffs into the lungs every 6 (six) hours as needed for wheezing or shortness of breath. 1 Inhaler prn  . aspirin 81 MG tablet Take 81 mg by mouth daily.    . calcium-vitamin D (OSCAL WITH D) 500-200 MG-UNIT per tablet Take 1 tablet by mouth 2 (two) times daily.    Marland Kitchen EPINEPHrine (EPIPEN 2-PAK) 0.3 mg/0.3 mL DEVI Inject 0.3 mLs (0.3 mg total) into the muscle daily as needed. For severe allergic reaction 1 Device prn  . Ergocalciferol (VITAMIN D2) 400 UNITS TABS Take by mouth.    . hydroxypropyl methylcellulose / hypromellose (ISOPTO TEARS / GONIOVISC) 2.5 % ophthalmic solution 1 drop.    . Magnesium 250 MG TABS Take by mouth.    . Misc Natural Products (GLUCOS-CHONDROIT-MSM COMPLEX PO) Take by mouth.    . montelukast (SINGULAIR) 10 MG tablet Take 1 tablet (10 mg total) by mouth at bedtime. 90 tablet 3  . NONFORMULARY OR COMPOUNDED ITEM Allergy Vaccine 1:10 Given at Home    . Probiotic Product (PROBIOTIC PO) Take by mouth. gummies BID    . TUBERCULIN SYR 1CC/25GX5/8" (B-D TB SYRINGE 1CC/25GX5/8") 25G X 5/8" 1 ML MISC Use as directed for allergy injections 100 each 0  . budesonide (PULMICORT FLEXHALER) 180 MCG/ACT inhaler 2 puffs then rinse mouth, twice daily 3 each 0  . levothyroxine (SYNTHROID, LEVOTHROID) 100 MCG tablet Take 1 tablet (100 mcg total) by mouth daily before breakfast. 90 tablet 0  . losartan (COZAAR) 100 MG tablet take 1 tablet by mouth once daily 90 tablet 0  . raloxifene (EVISTA) 60 MG tablet Take 1 tablet (60 mg total) by mouth daily. 90 tablet 0  . ranitidine  (ZANTAC) 300 MG tablet take 1 tablet by mouth at bedtime 90 tablet 0  . simvastatin (ZOCOR) 40 MG tablet TAKE 1 TABLET DAILY IN THE EVENING. 90 tablet 0   No facility-administered medications prior to visit.    ROS Review of Systems  Constitutional: Negative.  Negative for fever, chills, diaphoresis, appetite change and fatigue.  HENT: Negative.   Eyes: Negative.   Respiratory: Negative.  Negative for cough, choking, chest tightness, shortness of breath and stridor.   Cardiovascular: Negative.  Negative for chest pain, palpitations and leg swelling.  Gastrointestinal: Negative.  Negative for nausea, vomiting, abdominal pain, diarrhea and constipation.  Endocrine: Negative.   Genitourinary: Negative.   Musculoskeletal: Negative.  Negative for myalgias, back pain, joint swelling and arthralgias.  Skin: Negative.  Negative for rash.  Allergic/Immunologic: Negative.   Neurological: Negative.  Negative for dizziness, seizures, weakness, numbness and headaches.  Hematological: Negative.  Negative for adenopathy. Does not bruise/bleed easily.  Psychiatric/Behavioral: Negative.     Objective:  BP 126/88 mmHg  Pulse 73  Temp(Src) 97.7 F (36.5 C) (Oral)  Resp 16  Ht 5\' 5"  (1.651 m)  Wt 168 lb (76.204 kg)  BMI 27.96 kg/m2  SpO2 97%  BP Readings from Last 3 Encounters:  10/30/15 126/88  05/11/15 120/76  10/25/14 116/66    Wt Readings from Last 3 Encounters:  10/30/15 168 lb (76.204 kg)  05/11/15 168 lb (76.204 kg)  10/25/14 169 lb (76.658 kg)    Physical Exam  Constitutional: She is oriented to person, place, and time. She appears well-developed and well-nourished. No distress.  HENT:  Head: Normocephalic and atraumatic.  Mouth/Throat: Oropharynx is clear and moist. No oropharyngeal exudate.  Eyes: Conjunctivae are normal. Right eye exhibits no discharge. Left eye exhibits no discharge. No scleral icterus.  Neck: Normal range of motion. Neck supple. No JVD present. No  tracheal deviation present. No thyromegaly present.  Cardiovascular: Normal rate, regular rhythm, normal heart sounds and intact distal pulses.  Exam reveals no gallop and no friction rub.   No murmur heard. Pulmonary/Chest: Effort normal and breath sounds normal. No stridor. No respiratory distress. She has no wheezes. She has no rales. She exhibits no tenderness.  Abdominal: Soft. Bowel sounds are normal. She exhibits no distension and no mass. There is no tenderness. There is no rebound and no guarding.  Musculoskeletal: Normal range of motion. She exhibits no edema or tenderness.  Lymphadenopathy:    She has no cervical adenopathy.  Neurological: She is oriented to person, place, and time.  Skin: Skin is warm and dry. No rash noted. She is not diaphoretic. No erythema. No pallor.  Psychiatric: She has a normal mood and affect. Her behavior is normal. Judgment and thought content normal.  Vitals reviewed.   Lab Results  Component Value Date   WBC 5.8 11/01/2015   HGB 14.5 11/01/2015   HCT 43.7 11/01/2015   PLT 382.0 11/01/2015   GLUCOSE 105* 11/01/2015   CHOL 162 11/01/2015   TRIG 225.0* 11/01/2015   HDL 58.70 11/01/2015   LDLDIRECT 83.0 11/01/2015   LDLCALC 53 05/17/2014   ALT 23 11/01/2015   AST 19 11/01/2015   NA 138 11/01/2015   K 4.4 11/01/2015   CL 104 11/01/2015   CREATININE 1.01 11/01/2015   BUN 24* 11/01/2015   CO2 29 11/01/2015   TSH 3.21 11/01/2015   INR 1.01 05/15/2012   HGBA1C 5.8 11/01/2015    Dg Chest 2 View  03/12/2013  *RADIOLOGY REPORT* Clinical Data: Physical exam.  Asthma, prior smoker. CHEST - 2 VIEW Comparison: 05/15/2012 Findings: Heart is normal size.  Lungs are clear.  No effusions. Old healed bilateral rib fractures.  No visible acute fracture. IMPRESSION: No acute cardiopulmonary disease. Original Report Authenticated By: Rolm Baptise, M.D.    Assessment & Plan:   Natalie Shannon was seen today for hypothyroidism and annual exam.  Diagnoses and all  orders for this visit:  Other specified hypothyroidism- her TSH is in the normal range, she will stay on the current dose. -     Lipid panel; Future -     TSH; Future -     levothyroxine (SYNTHROID, LEVOTHROID) 100 MCG tablet; Take 1 tablet (100 mcg total) by mouth daily before breakfast.  Essential hypertension- her blood pressure is well-controlled, lites and renal function are stable. -     Comprehensive metabolic panel; Future -     CBC with Differential/Platelet; Future  Hyperglycemia- she has prediabetes, her blood sugars are well-controlled. -     Hemoglobin A1c; Future -     Comprehensive metabolic panel; Future  HYPERLIPIDEMIA- she has achieved her LDL goal is doing well on the statin. -     Lipid panel; Future -     TSH; Future -     Comprehensive metabolic panel; Future  Other orders -     budesonide (PULMICORT FLEXHALER) 180 MCG/ACT inhaler; 2  puffs then rinse mouth, twice daily -     Cancel: levothyroxine (SYNTHROID, LEVOTHROID) 100 MCG tablet; Take 1 tablet (100 mcg total) by mouth daily before breakfast. -     losartan (COZAAR) 100 MG tablet; take 1 tablet by mouth once daily -     raloxifene (EVISTA) 60 MG tablet; Take 1 tablet (60 mg total) by mouth daily. -     ranitidine (ZANTAC) 300 MG tablet; take 1 tablet by mouth at bedtime -     simvastatin (ZOCOR) 40 MG tablet; TAKE 1 TABLET DAILY IN THE EVENING. -     Cancel: levothyroxine (SYNTHROID, LEVOTHROID) 100 MCG tablet; Take 1 tablet (100 mcg total) by mouth daily before breakfast.   I am having Natalie Shannon maintain her calcium-vitamin D, EPINEPHrine, aspirin, TUBERCULIN SYR 1CC/25GX5/8", hydroxypropyl methylcellulose / hypromellose, Misc Natural Products (GLUCOS-CHONDROIT-MSM COMPLEX PO), Vitamin D2, Magnesium, Probiotic Product (PROBIOTIC PO), albuterol, montelukast, NONFORMULARY OR COMPOUNDED ITEM, budesonide, losartan, raloxifene, ranitidine, simvastatin, and levothyroxine.  Meds ordered this encounter    Medications  . budesonide (PULMICORT FLEXHALER) 180 MCG/ACT inhaler    Sig: 2 puffs then rinse mouth, twice daily    Dispense:  3 each    Refill:  3  . losartan (COZAAR) 100 MG tablet    Sig: take 1 tablet by mouth once daily    Dispense:  90 tablet    Refill:  1  . raloxifene (EVISTA) 60 MG tablet    Sig: Take 1 tablet (60 mg total) by mouth daily.    Dispense:  90 tablet    Refill:  3  . ranitidine (ZANTAC) 300 MG tablet    Sig: take 1 tablet by mouth at bedtime    Dispense:  90 tablet    Refill:  3  . simvastatin (ZOCOR) 40 MG tablet    Sig: TAKE 1 TABLET DAILY IN THE EVENING.    Dispense:  90 tablet    Refill:  2  . levothyroxine (SYNTHROID, LEVOTHROID) 100 MCG tablet    Sig: Take 1 tablet (100 mcg total) by mouth daily before breakfast.    Dispense:  90 tablet    Refill:  3   See AVS for instructions about healthy living and anticipatory guidance.  Follow-up: Return if symptoms worsen or fail to improve.  Scarlette Calico, MD

## 2015-10-30 NOTE — Progress Notes (Signed)
Pre visit review using our clinic review tool, if applicable. No additional management support is needed unless otherwise documented below in the visit note. 

## 2015-10-30 NOTE — Patient Instructions (Signed)
Preventive Care for Adults, Female A healthy lifestyle and preventive care can promote health and wellness. Preventive health guidelines for women include the following key practices.  A routine yearly physical is a good way to check with your health care provider about your health and preventive screening. It is a chance to share any concerns and updates on your health and to receive a thorough exam.  Visit your dentist for a routine exam and preventive care every 6 months. Brush your teeth twice a day and floss once a day. Good oral hygiene prevents tooth decay and gum disease.  The frequency of eye exams is based on your age, health, family medical history, use of contact lenses, and other factors. Follow your health care provider's recommendations for frequency of eye exams.  Eat a healthy diet. Foods like vegetables, fruits, whole grains, low-fat dairy products, and lean protein foods contain the nutrients you need without too many calories. Decrease your intake of foods high in solid fats, added sugars, and salt. Eat the right amount of calories for you.Get information about a proper diet from your health care provider, if necessary.  Regular physical exercise is one of the most important things you can do for your health. Most adults should get at least 150 minutes of moderate-intensity exercise (any activity that increases your heart rate and causes you to sweat) each week. In addition, most adults need muscle-strengthening exercises on 2 or more days a week.  Maintain a healthy weight. The body mass index (BMI) is a screening tool to identify possible weight problems. It provides an estimate of body fat based on height and weight. Your health care provider can find your BMI and can help you achieve or maintain a healthy weight.For adults 20 years and older:  A BMI below 18.5 is considered underweight.  A BMI of 18.5 to 24.9 is normal.  A BMI of 25 to 29.9 is considered overweight.  A  BMI of 30 and above is considered obese.  Maintain normal blood lipids and cholesterol levels by exercising and minimizing your intake of saturated fat. Eat a balanced diet with plenty of fruit and vegetables. Blood tests for lipids and cholesterol should begin at age 45 and be repeated every 5 years. If your lipid or cholesterol levels are high, you are over 50, or you are at high risk for heart disease, you may need your cholesterol levels checked more frequently.Ongoing high lipid and cholesterol levels should be treated with medicines if diet and exercise are not working.  If you smoke, find out from your health care provider how to quit. If you do not use tobacco, do not start.  Lung cancer screening is recommended for adults aged 45-80 years who are at high risk for developing lung cancer because of a history of smoking. A yearly low-dose CT scan of the lungs is recommended for people who have at least a 30-pack-year history of smoking and are a current smoker or have quit within the past 15 years. A pack year of smoking is smoking an average of 1 pack of cigarettes a day for 1 year (for example: 1 pack a day for 30 years or 2 packs a day for 15 years). Yearly screening should continue until the smoker has stopped smoking for at least 15 years. Yearly screening should be stopped for people who develop a health problem that would prevent them from having lung cancer treatment.  If you are pregnant, do not drink alcohol. If you are  breastfeeding, be very cautious about drinking alcohol. If you are not pregnant and choose to drink alcohol, do not have more than 1 drink per day. One drink is considered to be 12 ounces (355 mL) of beer, 5 ounces (148 mL) of wine, or 1.5 ounces (44 mL) of liquor.  Avoid use of street drugs. Do not share needles with anyone. Ask for help if you need support or instructions about stopping the use of drugs.  High blood pressure causes heart disease and increases the risk  of stroke. Your blood pressure should be checked at least every 1 to 2 years. Ongoing high blood pressure should be treated with medicines if weight loss and exercise do not work.  If you are 55-79 years old, ask your health care provider if you should take aspirin to prevent strokes.  Diabetes screening is done by taking a blood sample to check your blood glucose level after you have not eaten for a certain period of time (fasting). If you are not overweight and you do not have risk factors for diabetes, you should be screened once every 3 years starting at age 45. If you are overweight or obese and you are 40-70 years of age, you should be screened for diabetes every year as part of your cardiovascular risk assessment.  Breast cancer screening is essential preventive care for women. You should practice "breast self-awareness." This means understanding the normal appearance and feel of your breasts and may include breast self-examination. Any changes detected, no matter how small, should be reported to a health care provider. Women in their 20s and 30s should have a clinical breast exam (CBE) by a health care provider as part of a regular health exam every 1 to 3 years. After age 40, women should have a CBE every year. Starting at age 40, women should consider having a mammogram (breast X-ray test) every year. Women who have a family history of breast cancer should talk to their health care provider about genetic screening. Women at a high risk of breast cancer should talk to their health care providers about having an MRI and a mammogram every year.  Breast cancer gene (BRCA)-related cancer risk assessment is recommended for women who have family members with BRCA-related cancers. BRCA-related cancers include breast, ovarian, tubal, and peritoneal cancers. Having family members with these cancers may be associated with an increased risk for harmful changes (mutations) in the breast cancer genes BRCA1 and  BRCA2. Results of the assessment will determine the need for genetic counseling and BRCA1 and BRCA2 testing.  Your health care provider may recommend that you be screened regularly for cancer of the pelvic organs (ovaries, uterus, and vagina). This screening involves a pelvic examination, including checking for microscopic changes to the surface of your cervix (Pap test). You may be encouraged to have this screening done every 3 years, beginning at age 21.  For women ages 30-65, health care providers may recommend pelvic exams and Pap testing every 3 years, or they may recommend the Pap and pelvic exam, combined with testing for human papilloma virus (HPV), every 5 years. Some types of HPV increase your risk of cervical cancer. Testing for HPV may also be done on women of any age with unclear Pap test results.  Other health care providers may not recommend any screening for nonpregnant women who are considered low risk for pelvic cancer and who do not have symptoms. Ask your health care provider if a screening pelvic exam is right for   you.  If you have had past treatment for cervical cancer or a condition that could lead to cancer, you need Pap tests and screening for cancer for at least 20 years after your treatment. If Pap tests have been discontinued, your risk factors (such as having a new sexual partner) need to be reassessed to determine if screening should resume. Some women have medical problems that increase the chance of getting cervical cancer. In these cases, your health care provider may recommend more frequent screening and Pap tests.  Colorectal cancer can be detected and often prevented. Most routine colorectal cancer screening begins at the age of 50 years and continues through age 75 years. However, your health care provider may recommend screening at an earlier age if you have risk factors for colon cancer. On a yearly basis, your health care provider may provide home test kits to check  for hidden blood in the stool. Use of a small camera at the end of a tube, to directly examine the colon (sigmoidoscopy or colonoscopy), can detect the earliest forms of colorectal cancer. Talk to your health care provider about this at age 50, when routine screening begins. Direct exam of the colon should be repeated every 5-10 years through age 75 years, unless early forms of precancerous polyps or small growths are found.  People who are at an increased risk for hepatitis B should be screened for this virus. You are considered at high risk for hepatitis B if:  You were born in a country where hepatitis B occurs often. Talk with your health care provider about which countries are considered high risk.  Your parents were born in a high-risk country and you have not received a shot to protect against hepatitis B (hepatitis B vaccine).  You have HIV or AIDS.  You use needles to inject street drugs.  You live with, or have sex with, someone who has hepatitis B.  You get hemodialysis treatment.  You take certain medicines for conditions like cancer, organ transplantation, and autoimmune conditions.  Hepatitis C blood testing is recommended for all people born from 1945 through 1965 and any individual with known risks for hepatitis C.  Practice safe sex. Use condoms and avoid high-risk sexual practices to reduce the spread of sexually transmitted infections (STIs). STIs include gonorrhea, chlamydia, syphilis, trichomonas, herpes, HPV, and human immunodeficiency virus (HIV). Herpes, HIV, and HPV are viral illnesses that have no cure. They can result in disability, cancer, and death.  You should be screened for sexually transmitted illnesses (STIs) including gonorrhea and chlamydia if:  You are sexually active and are younger than 24 years.  You are older than 24 years and your health care provider tells you that you are at risk for this type of infection.  Your sexual activity has changed  since you were last screened and you are at an increased risk for chlamydia or gonorrhea. Ask your health care provider if you are at risk.  If you are at risk of being infected with HIV, it is recommended that you take a prescription medicine daily to prevent HIV infection. This is called preexposure prophylaxis (PrEP). You are considered at risk if:  You are sexually active and do not regularly use condoms or know the HIV status of your partner(s).  You take drugs by injection.  You are sexually active with a partner who has HIV.  Talk with your health care provider about whether you are at high risk of being infected with HIV. If   you choose to begin PrEP, you should first be tested for HIV. You should then be tested every 3 months for as long as you are taking PrEP.  Osteoporosis is a disease in which the bones lose minerals and strength with aging. This can result in serious bone fractures or breaks. The risk of osteoporosis can be identified using a bone density scan. Women ages 67 years and over and women at risk for fractures or osteoporosis should discuss screening with their health care providers. Ask your health care provider whether you should take a calcium supplement or vitamin D to reduce the rate of osteoporosis.  Menopause can be associated with physical symptoms and risks. Hormone replacement therapy is available to decrease symptoms and risks. You should talk to your health care provider about whether hormone replacement therapy is right for you.  Use sunscreen. Apply sunscreen liberally and repeatedly throughout the day. You should seek shade when your shadow is shorter than you. Protect yourself by wearing long sleeves, pants, a wide-brimmed hat, and sunglasses year round, whenever you are outdoors.  Once a month, do a whole body skin exam, using a mirror to look at the skin on your back. Tell your health care provider of new moles, moles that have irregular borders, moles that  are larger than a pencil eraser, or moles that have changed in shape or color.  Stay current with required vaccines (immunizations).  Influenza vaccine. All adults should be immunized every year.  Tetanus, diphtheria, and acellular pertussis (Td, Tdap) vaccine. Pregnant women should receive 1 dose of Tdap vaccine during each pregnancy. The dose should be obtained regardless of the length of time since the last dose. Immunization is preferred during the 27th-36th week of gestation. An adult who has not previously received Tdap or who does not know her vaccine status should receive 1 dose of Tdap. This initial dose should be followed by tetanus and diphtheria toxoids (Td) booster doses every 10 years. Adults with an unknown or incomplete history of completing a 3-dose immunization series with Td-containing vaccines should begin or complete a primary immunization series including a Tdap dose. Adults should receive a Td booster every 10 years.  Varicella vaccine. An adult without evidence of immunity to varicella should receive 2 doses or a second dose if she has previously received 1 dose. Pregnant females who do not have evidence of immunity should receive the first dose after pregnancy. This first dose should be obtained before leaving the health care facility. The second dose should be obtained 4-8 weeks after the first dose.  Human papillomavirus (HPV) vaccine. Females aged 13-26 years who have not received the vaccine previously should obtain the 3-dose series. The vaccine is not recommended for use in pregnant females. However, pregnancy testing is not needed before receiving a dose. If a female is found to be pregnant after receiving a dose, no treatment is needed. In that case, the remaining doses should be delayed until after the pregnancy. Immunization is recommended for any person with an immunocompromised condition through the age of 61 years if she did not get any or all doses earlier. During the  3-dose series, the second dose should be obtained 4-8 weeks after the first dose. The third dose should be obtained 24 weeks after the first dose and 16 weeks after the second dose.  Zoster vaccine. One dose is recommended for adults aged 30 years or older unless certain conditions are present.  Measles, mumps, and rubella (MMR) vaccine. Adults born  before 1957 generally are considered immune to measles and mumps. Adults born in 1957 or later should have 1 or more doses of MMR vaccine unless there is a contraindication to the vaccine or there is laboratory evidence of immunity to each of the three diseases. A routine second dose of MMR vaccine should be obtained at least 28 days after the first dose for students attending postsecondary schools, health care workers, or international travelers. People who received inactivated measles vaccine or an unknown type of measles vaccine during 1963-1967 should receive 2 doses of MMR vaccine. People who received inactivated mumps vaccine or an unknown type of mumps vaccine before 1979 and are at high risk for mumps infection should consider immunization with 2 doses of MMR vaccine. For females of childbearing age, rubella immunity should be determined. If there is no evidence of immunity, females who are not pregnant should be vaccinated. If there is no evidence of immunity, females who are pregnant should delay immunization until after pregnancy. Unvaccinated health care workers born before 1957 who lack laboratory evidence of measles, mumps, or rubella immunity or laboratory confirmation of disease should consider measles and mumps immunization with 2 doses of MMR vaccine or rubella immunization with 1 dose of MMR vaccine.  Pneumococcal 13-valent conjugate (PCV13) vaccine. When indicated, a person who is uncertain of his immunization history and has no record of immunization should receive the PCV13 vaccine. All adults 65 years of age and older should receive this  vaccine. An adult aged 19 years or older who has certain medical conditions and has not been previously immunized should receive 1 dose of PCV13 vaccine. This PCV13 should be followed with a dose of pneumococcal polysaccharide (PPSV23) vaccine. Adults who are at high risk for pneumococcal disease should obtain the PPSV23 vaccine at least 8 weeks after the dose of PCV13 vaccine. Adults older than 72 years of age who have normal immune system function should obtain the PPSV23 vaccine dose at least 1 year after the dose of PCV13 vaccine.  Pneumococcal polysaccharide (PPSV23) vaccine. When PCV13 is also indicated, PCV13 should be obtained first. All adults aged 65 years and older should be immunized. An adult younger than age 65 years who has certain medical conditions should be immunized. Any person who resides in a nursing home or long-term care facility should be immunized. An adult smoker should be immunized. People with an immunocompromised condition and certain other conditions should receive both PCV13 and PPSV23 vaccines. People with human immunodeficiency virus (HIV) infection should be immunized as soon as possible after diagnosis. Immunization during chemotherapy or radiation therapy should be avoided. Routine use of PPSV23 vaccine is not recommended for American Indians, [place] Natives, or people younger than 65 years unless there are medical conditions that require PPSV23 vaccine. When indicated, people who have unknown immunization and have no record of immunization should receive PPSV23 vaccine. One-time revaccination 5 years after the first dose of PPSV23 is recommended for people aged 19-64 years who have chronic kidney failure, nephrotic syndrome, asplenia, or immunocompromised conditions. People who received 1-2 doses of PPSV23 before age 65 years should receive another dose of PPSV23 vaccine at age 65 years or later if at least 5 years have passed since the previous dose. Doses of PPSV23 are not  needed for people immunized with PPSV23 at or after age 65 years.  Meningococcal vaccine. Adults with asplenia or persistent complement component deficiencies should receive 2 doses of quadrivalent meningococcal conjugate (MenACWY-D) vaccine. The doses should be obtained   at least 2 months apart. Microbiologists working with certain meningococcal bacteria, [place] recruits, people at risk during an outbreak, and people who travel to or live in countries with a high rate of meningitis should be immunized. A first-year college student up through age 34 years who is living in a residence hall should receive a dose if she did not receive a dose on or after her 16th birthday. Adults who have certain high-risk conditions should receive one or more doses of vaccine.  Hepatitis A vaccine. Adults who wish to be protected from this disease, have certain high-risk conditions, work with hepatitis A-infected animals, work in hepatitis A research labs, or travel to or work in countries with a high rate of hepatitis A should be immunized. Adults who were previously unvaccinated and who anticipate close contact with an international adoptee during the first 60 days after arrival in the [place] from a country with a high rate of hepatitis A should be immunized.  Hepatitis B vaccine. Adults who wish to be protected from this disease, have certain high-risk conditions, may be exposed to blood or other infectious body fluids, are household contacts or sex partners of hepatitis B positive people, are clients or workers in certain care facilities, or travel to or work in countries with a high rate of hepatitis B should be immunized.  Haemophilus influenzae type b (Hib) vaccine. A previously unvaccinated person with asplenia or sickle cell disease or having a scheduled splenectomy should receive 1 dose of Hib vaccine. Regardless of previous immunization, a recipient of a hematopoietic stem cell transplant should receive a  3-dose series 6-12 months after her successful transplant. Hib vaccine is not recommended for adults with HIV infection. Preventive Services / Frequency Ages 35 to 4 years  Blood pressure check.** / Every 3-5 years.  Lipid and cholesterol check.** / Every 5 years beginning at age 60.  Clinical breast exam.** / Every 3 years for women in their 71s and 10s.  BRCA-related cancer risk assessment.** / For women who have family members with a BRCA-related cancer (breast, ovarian, tubal, or peritoneal cancers).  Pap test.** / Every 2 years from ages 76 through 26. Every 3 years starting at age 61 through age 76 or 93 with a history of 3 consecutive normal Pap tests.  HPV screening.** / Every 3 years from ages 37 through ages 60 to 51 with a history of 3 consecutive normal Pap tests.  Hepatitis C blood test.** / For any individual with known risks for hepatitis C.  Skin self-exam. / Monthly.  Influenza vaccine. / Every year.  Tetanus, diphtheria, and acellular pertussis (Tdap, Td) vaccine.** / Consult your health care provider. Pregnant women should receive 1 dose of Tdap vaccine during each pregnancy. 1 dose of Td every 10 years.  Varicella vaccine.** / Consult your health care provider. Pregnant females who do not have evidence of immunity should receive the first dose after pregnancy.  HPV vaccine. / 3 doses over 6 months, if 93 and younger. The vaccine is not recommended for use in pregnant females. However, pregnancy testing is not needed before receiving a dose.  Measles, mumps, rubella (MMR) vaccine.** / You need at least 1 dose of MMR if you were born in 1957 or later. You may also need a 2nd dose. For females of childbearing age, rubella immunity should be determined. If there is no evidence of immunity, females who are not pregnant should be vaccinated. If there is no evidence of immunity, females who are  pregnant should delay immunization until after pregnancy.  Pneumococcal  13-valent conjugate (PCV13) vaccine.** / Consult your health care provider.  Pneumococcal polysaccharide (PPSV23) vaccine.** / 1 to 2 doses if you smoke cigarettes or if you have certain conditions.  Meningococcal vaccine.** / 1 dose if you are age 68 to 8 years and a Market researcher living in a residence hall, or have one of several medical conditions, you need to get vaccinated against meningococcal disease. You may also need additional booster doses.  Hepatitis A vaccine.** / Consult your health care provider.  Hepatitis B vaccine.** / Consult your health care provider.  Haemophilus influenzae type b (Hib) vaccine.** / Consult your health care provider. Ages 7 to 53 years  Blood pressure check.** / Every year.  Lipid and cholesterol check.** / Every 5 years beginning at age 25 years.  Lung cancer screening. / Every year if you are aged 11-80 years and have a 30-pack-year history of smoking and currently smoke or have quit within the past 15 years. Yearly screening is stopped once you have quit smoking for at least 15 years or develop a health problem that would prevent you from having lung cancer treatment.  Clinical breast exam.** / Every year after age 48 years.  BRCA-related cancer risk assessment.** / For women who have family members with a BRCA-related cancer (breast, ovarian, tubal, or peritoneal cancers).  Mammogram.** / Every year beginning at age 41 years and continuing for as long as you are in good health. Consult with your health care provider.  Pap test.** / Every 3 years starting at age 65 years through age 37 or 70 years with a history of 3 consecutive normal Pap tests.  HPV screening.** / Every 3 years from ages 72 years through ages 60 to 40 years with a history of 3 consecutive normal Pap tests.  Fecal occult blood test (FOBT) of stool. / Every year beginning at age 21 years and continuing until age 5 years. You may not need to do this test if you get  a colonoscopy every 10 years.  Flexible sigmoidoscopy or colonoscopy.** / Every 5 years for a flexible sigmoidoscopy or every 10 years for a colonoscopy beginning at age 35 years and continuing until age 48 years.  Hepatitis C blood test.** / For all people born from [birth year] through 1965 and any individual with known risks for hepatitis C.  Skin self-exam. / Monthly.  Influenza vaccine. / Every year.  Tetanus, diphtheria, and acellular pertussis (Tdap/Td) vaccine.** / Consult your health care provider. Pregnant women should receive 1 dose of Tdap vaccine during each pregnancy. 1 dose of Td every 10 years.  Varicella vaccine.** / Consult your health care provider. Pregnant females who do not have evidence of immunity should receive the first dose after pregnancy.  Zoster vaccine.** / 1 dose for adults aged 30 years or older.  Measles, mumps, rubella (MMR) vaccine.** / You need at least 1 dose of MMR if you were born in 1957 or later. You may also need a second dose. For females of childbearing age, rubella immunity should be determined. If there is no evidence of immunity, females who are not pregnant should be vaccinated. If there is no evidence of immunity, females who are pregnant should delay immunization until after pregnancy.  Pneumococcal 13-valent conjugate (PCV13) vaccine.** / Consult your health care provider.  Pneumococcal polysaccharide (PPSV23) vaccine.** / 1 to 2 doses if you smoke cigarettes or if you have certain conditions.  Meningococcal vaccine.** /  Consult your health care provider.  Hepatitis A vaccine.** / Consult your health care provider.  Hepatitis B vaccine.** / Consult your health care provider.  Haemophilus influenzae type b (Hib) vaccine.** / Consult your health care provider. Ages 64 years and over  Blood pressure check.** / Every year.  Lipid and cholesterol check.** / Every 5 years beginning at age 23 years.  Lung cancer screening. / Every year if you  are aged 16-80 years and have a 30-pack-year history of smoking and currently smoke or have quit within the past 15 years. Yearly screening is stopped once you have quit smoking for at least 15 years or develop a health problem that would prevent you from having lung cancer treatment.  Clinical breast exam.** / Every year after age 74 years.  BRCA-related cancer risk assessment.** / For women who have family members with a BRCA-related cancer (breast, ovarian, tubal, or peritoneal cancers).  Mammogram.** / Every year beginning at age 44 years and continuing for as long as you are in good health. Consult with your health care provider.  Pap test.** / Every 3 years starting at age 58 years through age 22 or 39 years with 3 consecutive normal Pap tests. Testing can be stopped between 65 and 70 years with 3 consecutive normal Pap tests and no abnormal Pap or HPV tests in the past 10 years.  HPV screening.** / Every 3 years from ages 64 years through ages 70 or 61 years with a history of 3 consecutive normal Pap tests. Testing can be stopped between 65 and 70 years with 3 consecutive normal Pap tests and no abnormal Pap or HPV tests in the past 10 years.  Fecal occult blood test (FOBT) of stool. / Every year beginning at age 40 years and continuing until age 27 years. You may not need to do this test if you get a colonoscopy every 10 years.  Flexible sigmoidoscopy or colonoscopy.** / Every 5 years for a flexible sigmoidoscopy or every 10 years for a colonoscopy beginning at age 7 years and continuing until age 32 years.  Hepatitis C blood test.** / For all people born from [birth year] through 1963 and any individual with known risks for hepatitis C.  Osteoporosis screening.** / A one-time screening for women ages 30 years and over and women at risk for fractures or osteoporosis.  Skin self-exam. / Monthly.  Influenza vaccine. / Every year.  Tetanus, diphtheria, and acellular pertussis (Tdap/Td)  vaccine.** / 1 dose of Td every 10 years.  Varicella vaccine.** / Consult your health care provider.  Zoster vaccine.** / 1 dose for adults aged 35 years or older.  Pneumococcal 13-valent conjugate (PCV13) vaccine.** / Consult your health care provider.  Pneumococcal polysaccharide (PPSV23) vaccine.** / 1 dose for all adults aged 46 years and older.  Meningococcal vaccine.** / Consult your health care provider.  Hepatitis A vaccine.** / Consult your health care provider.  Hepatitis B vaccine.** / Consult your health care provider.  Haemophilus influenzae type b (Hib) vaccine.** / Consult your health care provider. ** Family history and personal history of risk and conditions may change your health care provider's recommendations.   This information is not intended to replace advice given to you by your health care provider. Make sure you discuss any questions you have with your health care provider.   Document Released: 02/11/2002 Document Revised: 01/06/2015 Document Reviewed: 05/13/2011 Elsevier Interactive Patient Education [employer].

## 2015-11-01 ENCOUNTER — Other Ambulatory Visit (INDEPENDENT_AMBULATORY_CARE_PROVIDER_SITE_OTHER): Payer: Medicare PPO

## 2015-11-01 ENCOUNTER — Encounter: Payer: Self-pay | Admitting: Internal Medicine

## 2015-11-01 DIAGNOSIS — E782 Mixed hyperlipidemia: Secondary | ICD-10-CM

## 2015-11-01 DIAGNOSIS — E038 Other specified hypothyroidism: Secondary | ICD-10-CM | POA: Diagnosis not present

## 2015-11-01 DIAGNOSIS — I10 Essential (primary) hypertension: Secondary | ICD-10-CM | POA: Diagnosis not present

## 2015-11-01 DIAGNOSIS — R739 Hyperglycemia, unspecified: Secondary | ICD-10-CM | POA: Diagnosis not present

## 2015-11-01 DIAGNOSIS — Z0001 Encounter for general adult medical examination with abnormal findings: Secondary | ICD-10-CM | POA: Insufficient documentation

## 2015-11-01 DIAGNOSIS — Z0000 Encounter for general adult medical examination without abnormal findings: Secondary | ICD-10-CM | POA: Insufficient documentation

## 2015-11-01 LAB — COMPREHENSIVE METABOLIC PANEL
ALT: 23 U/L (ref 0–35)
AST: 19 U/L (ref 0–37)
Albumin: 4 g/dL (ref 3.5–5.2)
Alkaline Phosphatase: 67 U/L (ref 39–117)
BUN: 24 mg/dL — ABNORMAL HIGH (ref 6–23)
CALCIUM: 9.8 mg/dL (ref 8.4–10.5)
CHLORIDE: 104 meq/L (ref 96–112)
CO2: 29 meq/L (ref 19–32)
CREATININE: 1.01 mg/dL (ref 0.40–1.20)
GFR: 57.27 mL/min — ABNORMAL LOW (ref >60.00)
Glucose, Bld: 105 mg/dL — ABNORMAL HIGH (ref 70–99)
Potassium: 4.4 mEq/L (ref 3.5–5.1)
Sodium: 138 mEq/L (ref 135–145)
Total Bilirubin: 0.4 mg/dL (ref 0.2–1.2)
Total Protein: 6.9 g/dL (ref 6.0–8.3)

## 2015-11-01 LAB — CBC WITH DIFFERENTIAL/PLATELET
BASOS PCT: 0.7 % (ref 0.0–3.0)
Basophils Absolute: 0 10*3/uL (ref 0.0–0.1)
EOS ABS: 0.3 10*3/uL (ref 0.0–0.7)
Eosinophils Relative: 4.6 % (ref 0.0–5.0)
HEMATOCRIT: 43.7 % (ref 36.0–46.0)
Hemoglobin: 14.5 g/dL (ref 12.0–15.0)
LYMPHS PCT: 24.5 % (ref 12.0–46.0)
Lymphs Abs: 1.4 10*3/uL (ref 0.7–4.0)
MCHC: 33.2 g/dL (ref 30.0–36.0)
MCV: 88.7 fl (ref 78.0–100.0)
MONOS PCT: 8.2 % (ref 3.0–12.0)
Monocytes Absolute: 0.5 10*3/uL (ref 0.1–1.0)
NEUTROS ABS: 3.6 10*3/uL (ref 1.4–7.7)
NEUTROS PCT: 62 % (ref 43.0–77.0)
PLATELETS: 382 10*3/uL (ref 150.0–400.0)
RBC: 4.93 Mil/uL (ref 3.87–5.11)
RDW: 13.3 % (ref 11.5–15.5)
WBC: 5.8 10*3/uL (ref 4.0–10.5)

## 2015-11-01 LAB — LIPID PANEL
Cholesterol: 162 mg/dL (ref 0–200)
HDL: 58.7 mg/dL (ref >39.00)
NONHDL: 103.05
Total CHOL/HDL Ratio: 3
Triglycerides: 225 mg/dL — ABNORMAL HIGH (ref 0.0–149.0)
VLDL: 45 mg/dL — ABNORMAL HIGH (ref 0.0–40.0)

## 2015-11-01 LAB — HEMOGLOBIN A1C: Hgb A1c MFr Bld: 5.8 % (ref 4.6–6.5)

## 2015-11-01 LAB — LDL CHOLESTEROL, DIRECT: Direct LDL: 83 mg/dL

## 2015-11-01 LAB — TSH: TSH: 3.21 u[IU]/mL (ref 0.35–4.50)

## 2015-11-01 NOTE — Assessment & Plan Note (Signed)

## 2015-11-14 ENCOUNTER — Encounter: Payer: Self-pay | Admitting: Internal Medicine

## 2015-11-14 DIAGNOSIS — H43811 Vitreous degeneration, right eye: Secondary | ICD-10-CM | POA: Diagnosis not present

## 2015-11-14 DIAGNOSIS — H43812 Vitreous degeneration, left eye: Secondary | ICD-10-CM | POA: Diagnosis not present

## 2015-11-14 DIAGNOSIS — H26491 Other secondary cataract, right eye: Secondary | ICD-10-CM | POA: Diagnosis not present

## 2015-11-14 DIAGNOSIS — H26492 Other secondary cataract, left eye: Secondary | ICD-10-CM | POA: Diagnosis not present

## 2016-02-21 ENCOUNTER — Encounter: Payer: Self-pay | Admitting: Internal Medicine

## 2016-04-10 ENCOUNTER — Telehealth: Payer: Self-pay | Admitting: Internal Medicine

## 2016-04-10 DIAGNOSIS — J309 Allergic rhinitis, unspecified: Secondary | ICD-10-CM

## 2016-04-10 NOTE — Telephone Encounter (Signed)
Allergy Serum Extract Date Mixed: 04/10/16 Vial: 2 Strength: 1:10 Here/Mail/Pick Up: mail Mixed By: tbs Last OV: 05/11/15 Pending OV: 05/13/16

## 2016-05-13 ENCOUNTER — Ambulatory Visit (INDEPENDENT_AMBULATORY_CARE_PROVIDER_SITE_OTHER): Payer: Medicare Other | Admitting: Internal Medicine

## 2016-05-13 ENCOUNTER — Encounter: Payer: Self-pay | Admitting: Internal Medicine

## 2016-05-13 VITALS — BP 118/62 | HR 92 | Ht 65.0 in | Wt 166.4 lb

## 2016-05-13 DIAGNOSIS — J4520 Mild intermittent asthma, uncomplicated: Secondary | ICD-10-CM

## 2016-05-13 MED ORDER — EPINEPHRINE 0.3 MG/0.3ML IJ SOAJ: INTRAMUSCULAR | Status: DC

## 2016-05-13 MED ORDER — BUDESONIDE 180 MCG/ACT IN AEPB: INHALATION_SPRAY | RESPIRATORY_TRACT | Status: DC

## 2016-05-13 MED ORDER — ALBUTEROL SULFATE HFA 108 (90 BASE) MCG/ACT IN AERS: 2.0000 | INHALATION_SPRAY | Freq: Four times a day (QID) | RESPIRATORY_TRACT | Status: DC | PRN
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
## 2016-05-13 NOTE — Patient Instructions (Addendum)
We can continue allergy vaccine through this year as discussed  Med refills printed  Please call if I  can help

## 2016-05-20 ENCOUNTER — Telehealth: Payer: Self-pay | Admitting: Internal Medicine

## 2016-05-20 NOTE — Telephone Encounter (Signed)
Pt requesting that our office contact her insurance regarding her Pulmicort Rx Insurance is needing questions answered prior to covering [place] and was advised by Alyse Low that I needed to contact [facility] [phone] Member ID# [id number] Called [facility], placed on hold for over 40mins. Will hold in triage to follow up.

## 2016-05-21 MED ORDER — FLUTICASONE PROPIONATE HFA 110 MCG/ACT IN AERO: INHALATION_SPRAY | RESPIRATORY_TRACT | Status: DC

## 2016-05-21 NOTE — Telephone Encounter (Signed)
Ginger with [place], [phone], called stating formulary for alternative drugs are Arnuity ellipta, Flovent diskus, or Flovent HFA.  Please have physician determine best for the patient and send in RX.   None of these require a PA per Ginger, same price regardless for each of these 3 alternatives.

## 2016-05-21 NOTE — Telephone Encounter (Signed)
Pt cb, states PA has been denied [phone], wanting to know the next step

## 2016-05-21 NOTE — Telephone Encounter (Signed)
Called [facility] and spoke with [place]. PA has been initiated. All questions have been answered. It has been sent to clinical review. Will await PA decision.

## 2016-05-21 NOTE — Telephone Encounter (Signed)
Dr. Annamaria Boots, please advise if Pulmicort can be changed to one of the covered alternatives below.  Thank you!

## 2016-05-21 NOTE — Telephone Encounter (Signed)
Called, spoke with pt.  Discussed below recs per Dr. Annamaria Boots.  Pt verbalized understanding and aware rx sent to [facility].   Pt to call back if symptoms worsen with new inhaler or if she has side effects from it.

## 2016-05-21 NOTE — Telephone Encounter (Signed)
Suggest change from Pulmicort to Flovent HFA 110      Inhale 2 puffs then rinse mouth, twice daily

## 2016-05-21 NOTE — Telephone Encounter (Signed)
Patient states that Pulmicort was denied. Advised patient that she will need to check her formulary to see what alternative drug her insurance will pay for.  Pt states she just moved and cannot locate her formulary, so she said that she would call them and give [place] a call back with that information. Awaiting call back from patient.

## 2016-05-30 ENCOUNTER — Other Ambulatory Visit: Payer: Self-pay | Admitting: Internal Medicine

## 2016-05-30 LAB — HM MAMMOGRAPHY

## 2016-05-30 MED ORDER — MONTELUKAST SODIUM 10 MG PO TABS: 10.0000 mg | ORAL_TABLET | Freq: Every day | ORAL | Status: DC

## 2016-06-04 ENCOUNTER — Encounter: Payer: Self-pay | Admitting: Internal Medicine

## 2016-07-01 ENCOUNTER — Telehealth: Payer: Self-pay | Admitting: Internal Medicine

## 2016-07-01 MED ORDER — FLUTICASONE FUROATE 100 MCG/ACT IN AEPB: 1.0000 | INHALATION_SPRAY | Freq: Every day | RESPIRATORY_TRACT | Status: DC

## 2016-07-01 MED ORDER — FLUTICASONE FUROATE 200 MCG/ACT IN AEPB: 1.0000 | INHALATION_SPRAY | Freq: Every day | RESPIRATORY_TRACT | Status: DC

## 2016-07-01 NOTE — Telephone Encounter (Signed)
Offer sample and print script for Arnuity 200 Ellipta.   # 1, refill x 12  This was reported as covered by her insurance.  Inhale 1 puff, then rinse mouth well, once daily.

## 2016-07-01 NOTE — Telephone Encounter (Signed)
Patient notified of Dr. Janee Morn recommendations. Rx sent to pharmacy for [place].  Sample at front for patient to pick up. Patient aware. Nothing further needed.

## 2016-07-01 NOTE — Telephone Encounter (Signed)
Patient states that she went off the Pulmicort and switched to Flovent.  She said that the Flovent is not working at all.  She is coughing, SOB, sore in chest.  She is getting back into a chronic pattern again. She said that it scares her because she doesn't want to get back to the way she was before she started Pulmicort.     [facility] [place].  Allergies  Allergen Reactions  . Latex     rash  . Povidone-Iodine     edema  . Sulfonamide Derivatives     Mental status changes, altered vision   Current Outpatient Prescriptions on File Prior to Visit  Medication Sig Dispense Refill  . albuterol (PROVENTIL HFA;VENTOLIN HFA) 108 (90 Base) MCG/ACT inhaler Inhale 2 puffs into the lungs every 6 (six) hours as needed for wheezing or shortness of breath. 1 Inhaler prn  . aspirin 81 MG tablet Take 81 mg by mouth daily.    . calcium-vitamin D (OSCAL WITH D) 500-200 MG-UNIT per tablet Take 1 tablet by mouth 2 (two) times daily.    Marland Kitchen EPINEPHrine 0.3 mg/0.3 mL IJ SOAJ injection Inject into thigh for severe allergic reaction 1 Device 12  . Ergocalciferol (VITAMIN D2) 400 UNITS TABS Take by mouth.    . fluticasone (FLOVENT HFA) 110 MCG/ACT inhaler Inhale 2 puffs then rinse mouth, twice daily 1 Inhaler 11  . hydroxypropyl methylcellulose / hypromellose (ISOPTO TEARS / GONIOVISC) 2.5 % ophthalmic solution 1 drop.    Marland Kitchen levothyroxine (SYNTHROID, LEVOTHROID) 100 MCG tablet Take 1 tablet (100 mcg total) by mouth daily before breakfast. 90 tablet 3  . losartan (COZAAR) 100 MG tablet take 1 tablet by mouth once daily 90 tablet 1  . Magnesium 250 MG TABS Take by mouth.    . Misc Natural Products (GLUCOS-CHONDROIT-MSM COMPLEX PO) Take by mouth.    . montelukast (SINGULAIR) 10 MG tablet Take 1 tablet (10 mg total) by mouth at bedtime. 90 tablet 3  . NONFORMULARY OR COMPOUNDED ITEM Allergy Vaccine 1:10 Given at Home    . Probiotic Product (PROBIOTIC PO) Take by mouth. gummies BID    . raloxifene (EVISTA) 60  MG tablet Take 1 tablet (60 mg total) by mouth daily. 90 tablet 3  . ranitidine (ZANTAC) 300 MG tablet take 1 tablet by mouth at bedtime 90 tablet 3  . simvastatin (ZOCOR) 40 MG tablet TAKE 1 TABLET DAILY IN THE EVENING. 90 tablet 2  . TUBERCULIN SYR 1CC/25GX5/8" (B-D TB SYRINGE 1CC/25GX5/8") 25G X 5/8" 1 ML MISC Use as directed for allergy injections 100 each 0   No current facility-administered medications on file prior to visit.

## 2016-09-16 ENCOUNTER — Other Ambulatory Visit: Payer: Self-pay | Admitting: Internal Medicine

## 2016-09-24 ENCOUNTER — Other Ambulatory Visit: Payer: Self-pay | Admitting: Internal Medicine

## 2016-10-21 ENCOUNTER — Other Ambulatory Visit: Payer: Self-pay | Admitting: Internal Medicine

## 2016-10-30 ENCOUNTER — Encounter: Payer: Self-pay | Admitting: Internal Medicine

## 2016-10-30 ENCOUNTER — Ambulatory Visit (INDEPENDENT_AMBULATORY_CARE_PROVIDER_SITE_OTHER): Payer: Medicare Other | Admitting: Internal Medicine

## 2016-10-30 ENCOUNTER — Other Ambulatory Visit (INDEPENDENT_AMBULATORY_CARE_PROVIDER_SITE_OTHER): Payer: Medicare Other

## 2016-10-30 VITALS — BP 122/82 | HR 72 | Temp 97.8°F | Resp 16 | Ht 65.0 in | Wt 167.0 lb

## 2016-10-30 DIAGNOSIS — I10 Essential (primary) hypertension: Secondary | ICD-10-CM

## 2016-10-30 DIAGNOSIS — Z0000 Encounter for general adult medical examination without abnormal findings: Secondary | ICD-10-CM | POA: Diagnosis not present

## 2016-10-30 DIAGNOSIS — E781 Pure hyperglyceridemia: Secondary | ICD-10-CM

## 2016-10-30 DIAGNOSIS — R7989 Other specified abnormal findings of blood chemistry: Secondary | ICD-10-CM | POA: Diagnosis not present

## 2016-10-30 DIAGNOSIS — K219 Gastro-esophageal reflux disease without esophagitis: Secondary | ICD-10-CM

## 2016-10-30 DIAGNOSIS — E785 Hyperlipidemia, unspecified: Secondary | ICD-10-CM

## 2016-10-30 DIAGNOSIS — Z23 Encounter for immunization: Secondary | ICD-10-CM

## 2016-10-30 DIAGNOSIS — E039 Hypothyroidism, unspecified: Secondary | ICD-10-CM

## 2016-10-30 DIAGNOSIS — R739 Hyperglycemia, unspecified: Secondary | ICD-10-CM | POA: Diagnosis not present

## 2016-10-30 LAB — COMPREHENSIVE METABOLIC PANEL
ALT: 19 U/L (ref 0–35)
AST: 19 U/L (ref 0–37)
Albumin: 4 g/dL (ref 3.5–5.2)
Alkaline Phosphatase: 62 U/L (ref 39–117)
BILIRUBIN TOTAL: 0.3 mg/dL (ref 0.2–1.2)
BUN: 22 mg/dL (ref 6–23)
CALCIUM: 9.8 mg/dL (ref 8.4–10.5)
CHLORIDE: 105 meq/L (ref 96–112)
CO2: 25 meq/L (ref 19–32)
CREATININE: 1.12 mg/dL (ref 0.40–1.20)
GFR: 50.69 mL/min — ABNORMAL LOW (ref >60.00)
Glucose, Bld: 103 mg/dL — ABNORMAL HIGH (ref 70–99)
Potassium: 4.1 mEq/L (ref 3.5–5.1)
SODIUM: 141 meq/L (ref 135–145)
Total Protein: 6.8 g/dL (ref 6.0–8.3)

## 2016-10-30 LAB — CBC WITH DIFFERENTIAL/PLATELET
BASOS ABS: 0 10*3/uL (ref 0.0–0.1)
BASOS PCT: 0.7 % (ref 0.0–3.0)
EOS ABS: 0.3 10*3/uL (ref 0.0–0.7)
Eosinophils Relative: 4.5 % (ref 0.0–5.0)
HCT: 42.5 % (ref 36.0–46.0)
Hemoglobin: 14.2 g/dL (ref 12.0–15.0)
LYMPHS ABS: 1.3 10*3/uL (ref 0.7–4.0)
LYMPHS PCT: 20 % (ref 12.0–46.0)
MCHC: 33.4 g/dL (ref 30.0–36.0)
MCV: 86.2 fl (ref 78.0–100.0)
MONO ABS: 0.5 10*3/uL (ref 0.1–1.0)
Monocytes Relative: 7.7 % (ref 3.0–12.0)
NEUTROS ABS: 4.5 10*3/uL (ref 1.4–7.7)
NEUTROS PCT: 67.1 % (ref 43.0–77.0)
PLATELETS: 440 10*3/uL — AB (ref 150.0–400.0)
RBC: 4.93 Mil/uL (ref 3.87–5.11)
RDW: 13.7 % (ref 11.5–15.5)
WBC: 6.7 10*3/uL (ref 4.0–10.5)

## 2016-10-30 LAB — LIPID PANEL
CHOL/HDL RATIO: 3
CHOLESTEROL: 163 mg/dL (ref 0–200)
HDL: 54.6 mg/dL (ref >39.00)
NonHDL: 108.13
Triglycerides: 234 mg/dL — ABNORMAL HIGH (ref 0.0–149.0)
VLDL: 46.8 mg/dL — AB (ref 0.0–40.0)

## 2016-10-30 LAB — LDL CHOLESTEROL, DIRECT: LDL DIRECT: 89 mg/dL

## 2016-10-30 LAB — HEMOGLOBIN A1C: HEMOGLOBIN A1C: 6.3 % (ref 4.6–6.5)

## 2016-10-30 LAB — TSH: TSH: 3.28 u[IU]/mL (ref 0.35–4.50)

## 2016-10-30 MED ORDER — RALOXIFENE HCL 60 MG PO TABS: 60.0000 mg | ORAL_TABLET | Freq: Every day | ORAL | 3 refills | Status: DC

## 2016-10-30 MED ORDER — FLUTICASONE FUROATE 200 MCG/ACT IN AEPB: 1.0000 | INHALATION_SPRAY | Freq: Every day | RESPIRATORY_TRACT | 3 refills | Status: DC

## 2016-10-30 MED ORDER — RANITIDINE HCL 300 MG PO TABS: ORAL_TABLET | ORAL | 3 refills | Status: DC

## 2016-10-30 MED ORDER — ALBUTEROL SULFATE HFA 108 (90 BASE) MCG/ACT IN AERS: 2.0000 | INHALATION_SPRAY | Freq: Four times a day (QID) | RESPIRATORY_TRACT | 99 refills | Status: DC | PRN

## 2016-10-30 MED ORDER — MONTELUKAST SODIUM 10 MG PO TABS: 10.0000 mg | ORAL_TABLET | Freq: Every day | ORAL | 3 refills | Status: DC

## 2016-10-30 MED ORDER — ALUM HYDROXIDE-MAG CARBONATE 160-105 MG PO CHEW: 1.0000 | CHEWABLE_TABLET | Freq: Three times a day (TID) | ORAL | 11 refills | Status: DC | PRN

## 2016-10-30 MED ORDER — LOSARTAN POTASSIUM 100 MG PO TABS: 100.0000 mg | ORAL_TABLET | Freq: Every day | ORAL | 3 refills | Status: DC

## 2016-10-30 MED ORDER — SIMVASTATIN 40 MG PO TABS: 40.0000 mg | ORAL_TABLET | Freq: Every day | ORAL | 3 refills | Status: DC

## 2016-10-30 MED ORDER — FLUTICASONE PROPIONATE HFA 110 MCG/ACT IN AERO: INHALATION_SPRAY | RESPIRATORY_TRACT | 3 refills | Status: DC

## 2016-10-30 NOTE — Patient Instructions (Signed)
Preventive Care for Adults, Female A healthy lifestyle and preventive care can promote health and wellness. Preventive health guidelines for women include the following key practices.  A routine yearly physical is a good way to check with your health care provider about your health and preventive screening. It is a chance to share any concerns and updates on your health and to receive a thorough exam.  Visit your dentist for a routine exam and preventive care every 6 months. Brush your teeth twice a day and floss once a day. Good oral hygiene prevents tooth decay and gum disease.  The frequency of eye exams is based on your age, health, family medical history, use of contact lenses, and other factors. Follow your health care provider's recommendations for frequency of eye exams.  Eat a healthy diet. Foods like vegetables, fruits, whole grains, low-fat dairy products, and lean protein foods contain the nutrients you need without too many calories. Decrease your intake of foods high in solid fats, added sugars, and salt. Eat the right amount of calories for you.Get information about a proper diet from your health care provider, if necessary.  Regular physical exercise is one of the most important things you can do for your health. Most adults should get at least 150 minutes of moderate-intensity exercise (any activity that increases your heart rate and causes you to sweat) each week. In addition, most adults need muscle-strengthening exercises on 2 or more days a week.  Maintain a healthy weight. The body mass index (BMI) is a screening tool to identify possible weight problems. It provides an estimate of body fat based on height and weight. Your health care provider can find your BMI and can help you achieve or maintain a healthy weight.For adults 20 years and older:  A BMI below 18.5 is considered underweight.  A BMI of 18.5 to 24.9 is normal.  A BMI of 25 to 29.9 is considered overweight.  A  BMI of 30 and above is considered obese.  Maintain normal blood lipids and cholesterol levels by exercising and minimizing your intake of saturated fat. Eat a balanced diet with plenty of fruit and vegetables. Blood tests for lipids and cholesterol should begin at age 45 and be repeated every 5 years. If your lipid or cholesterol levels are high, you are over 50, or you are at high risk for heart disease, you may need your cholesterol levels checked more frequently.Ongoing high lipid and cholesterol levels should be treated with medicines if diet and exercise are not working.  If you smoke, find out from your health care provider how to quit. If you do not use tobacco, do not start.  Lung cancer screening is recommended for adults aged 45-80 years who are at high risk for developing lung cancer because of a history of smoking. A yearly low-dose CT scan of the lungs is recommended for people who have at least a 30-pack-year history of smoking and are a current smoker or have quit within the past 15 years. A pack year of smoking is smoking an average of 1 pack of cigarettes a day for 1 year (for example: 1 pack a day for 30 years or 2 packs a day for 15 years). Yearly screening should continue until the smoker has stopped smoking for at least 15 years. Yearly screening should be stopped for people who develop a health problem that would prevent them from having lung cancer treatment.  If you are pregnant, do not drink alcohol. If you are  breastfeeding, be very cautious about drinking alcohol. If you are not pregnant and choose to drink alcohol, do not have more than 1 drink per day. One drink is considered to be 12 ounces (355 mL) of beer, 5 ounces (148 mL) of wine, or 1.5 ounces (44 mL) of liquor.  Avoid use of street drugs. Do not share needles with anyone. Ask for help if you need support or instructions about stopping the use of drugs.  High blood pressure causes heart disease and increases the risk  of stroke. Your blood pressure should be checked at least every 1 to 2 years. Ongoing high blood pressure should be treated with medicines if weight loss and exercise do not work.  If you are 55-79 years old, ask your health care provider if you should take aspirin to prevent strokes.  Diabetes screening is done by taking a blood sample to check your blood glucose level after you have not eaten for a certain period of time (fasting). If you are not overweight and you do not have risk factors for diabetes, you should be screened once every 3 years starting at age 45. If you are overweight or obese and you are 40-70 years of age, you should be screened for diabetes every year as part of your cardiovascular risk assessment.  Breast cancer screening is essential preventive care for women. You should practice "breast self-awareness." This means understanding the normal appearance and feel of your breasts and may include breast self-examination. Any changes detected, no matter how small, should be reported to a health care provider. Women in their 20s and 30s should have a clinical breast exam (CBE) by a health care provider as part of a regular health exam every 1 to 3 years. After age 40, women should have a CBE every year. Starting at age 40, women should consider having a mammogram (breast X-ray test) every year. Women who have a family history of breast cancer should talk to their health care provider about genetic screening. Women at a high risk of breast cancer should talk to their health care providers about having an MRI and a mammogram every year.  Breast cancer gene (BRCA)-related cancer risk assessment is recommended for women who have family members with BRCA-related cancers. BRCA-related cancers include breast, ovarian, tubal, and peritoneal cancers. Having family members with these cancers may be associated with an increased risk for harmful changes (mutations) in the breast cancer genes BRCA1 and  BRCA2. Results of the assessment will determine the need for genetic counseling and BRCA1 and BRCA2 testing.  Your health care provider may recommend that you be screened regularly for cancer of the pelvic organs (ovaries, uterus, and vagina). This screening involves a pelvic examination, including checking for microscopic changes to the surface of your cervix (Pap test). You may be encouraged to have this screening done every 3 years, beginning at age 21.  For women ages 30-65, health care providers may recommend pelvic exams and Pap testing every 3 years, or they may recommend the Pap and pelvic exam, combined with testing for human papilloma virus (HPV), every 5 years. Some types of HPV increase your risk of cervical cancer. Testing for HPV may also be done on women of any age with unclear Pap test results.  Other health care providers may not recommend any screening for nonpregnant women who are considered low risk for pelvic cancer and who do not have symptoms. Ask your health care provider if a screening pelvic exam is right for   you.  If you have had past treatment for cervical cancer or a condition that could lead to cancer, you need Pap tests and screening for cancer for at least 20 years after your treatment. If Pap tests have been discontinued, your risk factors (such as having a new sexual partner) need to be reassessed to determine if screening should resume. Some women have medical problems that increase the chance of getting cervical cancer. In these cases, your health care provider may recommend more frequent screening and Pap tests.  Colorectal cancer can be detected and often prevented. Most routine colorectal cancer screening begins at the age of 50 years and continues through age 75 years. However, your health care provider may recommend screening at an earlier age if you have risk factors for colon cancer. On a yearly basis, your health care provider may provide home test kits to check  for hidden blood in the stool. Use of a small camera at the end of a tube, to directly examine the colon (sigmoidoscopy or colonoscopy), can detect the earliest forms of colorectal cancer. Talk to your health care provider about this at age 50, when routine screening begins. Direct exam of the colon should be repeated every 5-10 years through age 75 years, unless early forms of precancerous polyps or small growths are found.  People who are at an increased risk for hepatitis B should be screened for this virus. You are considered at high risk for hepatitis B if:  You were born in a country where hepatitis B occurs often. Talk with your health care provider about which countries are considered high risk.  Your parents were born in a high-risk country and you have not received a shot to protect against hepatitis B (hepatitis B vaccine).  You have HIV or AIDS.  You use needles to inject street drugs.  You live with, or have sex with, someone who has hepatitis B.  You get hemodialysis treatment.  You take certain medicines for conditions like cancer, organ transplantation, and autoimmune conditions.  Hepatitis C blood testing is recommended for all people born from 1945 through 1965 and any individual with known risks for hepatitis C.  Practice safe sex. Use condoms and avoid high-risk sexual practices to reduce the spread of sexually transmitted infections (STIs). STIs include gonorrhea, chlamydia, syphilis, trichomonas, herpes, HPV, and human immunodeficiency virus (HIV). Herpes, HIV, and HPV are viral illnesses that have no cure. They can result in disability, cancer, and death.  You should be screened for sexually transmitted illnesses (STIs) including gonorrhea and chlamydia if:  You are sexually active and are younger than 24 years.  You are older than 24 years and your health care provider tells you that you are at risk for this type of infection.  Your sexual activity has changed  since you were last screened and you are at an increased risk for chlamydia or gonorrhea. Ask your health care provider if you are at risk.  If you are at risk of being infected with HIV, it is recommended that you take a prescription medicine daily to prevent HIV infection. This is called preexposure prophylaxis (PrEP). You are considered at risk if:  You are sexually active and do not regularly use condoms or know the HIV status of your partner(s).  You take drugs by injection.  You are sexually active with a partner who has HIV.  Talk with your health care provider about whether you are at high risk of being infected with HIV. If   you choose to begin PrEP, you should first be tested for HIV. You should then be tested every 3 months for as long as you are taking PrEP.  Osteoporosis is a disease in which the bones lose minerals and strength with aging. This can result in serious bone fractures or breaks. The risk of osteoporosis can be identified using a bone density scan. Women ages 67 years and over and women at risk for fractures or osteoporosis should discuss screening with their health care providers. Ask your health care provider whether you should take a calcium supplement or vitamin D to reduce the rate of osteoporosis.  Menopause can be associated with physical symptoms and risks. Hormone replacement therapy is available to decrease symptoms and risks. You should talk to your health care provider about whether hormone replacement therapy is right for you.  Use sunscreen. Apply sunscreen liberally and repeatedly throughout the day. You should seek shade when your shadow is shorter than you. Protect yourself by wearing long sleeves, pants, a wide-brimmed hat, and sunglasses year round, whenever you are outdoors.  Once a month, do a whole body skin exam, using a mirror to look at the skin on your back. Tell your health care provider of new moles, moles that have irregular borders, moles that  are larger than a pencil eraser, or moles that have changed in shape or color.  Stay current with required vaccines (immunizations).  Influenza vaccine. All adults should be immunized every year.  Tetanus, diphtheria, and acellular pertussis (Td, Tdap) vaccine. Pregnant women should receive 1 dose of Tdap vaccine during each pregnancy. The dose should be obtained regardless of the length of time since the last dose. Immunization is preferred during the 27th-36th week of gestation. An adult who has not previously received Tdap or who does not know her vaccine status should receive 1 dose of Tdap. This initial dose should be followed by tetanus and diphtheria toxoids (Td) booster doses every 10 years. Adults with an unknown or incomplete history of completing a 3-dose immunization series with Td-containing vaccines should begin or complete a primary immunization series including a Tdap dose. Adults should receive a Td booster every 10 years.  Varicella vaccine. An adult without evidence of immunity to varicella should receive 2 doses or a second dose if she has previously received 1 dose. Pregnant females who do not have evidence of immunity should receive the first dose after pregnancy. This first dose should be obtained before leaving the health care facility. The second dose should be obtained 4-8 weeks after the first dose.  Human papillomavirus (HPV) vaccine. Females aged 13-26 years who have not received the vaccine previously should obtain the 3-dose series. The vaccine is not recommended for use in pregnant females. However, pregnancy testing is not needed before receiving a dose. If a female is found to be pregnant after receiving a dose, no treatment is needed. In that case, the remaining doses should be delayed until after the pregnancy. Immunization is recommended for any person with an immunocompromised condition through the age of 61 years if she did not get any or all doses earlier. During the  3-dose series, the second dose should be obtained 4-8 weeks after the first dose. The third dose should be obtained 24 weeks after the first dose and 16 weeks after the second dose.  Zoster vaccine. One dose is recommended for adults aged 30 years or older unless certain conditions are present.  Measles, mumps, and rubella (MMR) vaccine. Adults born  before 1957 generally are considered immune to measles and mumps. Adults born in 1957 or later should have 1 or more doses of MMR vaccine unless there is a contraindication to the vaccine or there is laboratory evidence of immunity to each of the three diseases. A routine second dose of MMR vaccine should be obtained at least 28 days after the first dose for students attending postsecondary schools, health care workers, or international travelers. People who received inactivated measles vaccine or an unknown type of measles vaccine during 1963-1967 should receive 2 doses of MMR vaccine. People who received inactivated mumps vaccine or an unknown type of mumps vaccine before 1979 and are at high risk for mumps infection should consider immunization with 2 doses of MMR vaccine. For females of childbearing age, rubella immunity should be determined. If there is no evidence of immunity, females who are not pregnant should be vaccinated. If there is no evidence of immunity, females who are pregnant should delay immunization until after pregnancy. Unvaccinated health care workers born before 1957 who lack laboratory evidence of measles, mumps, or rubella immunity or laboratory confirmation of disease should consider measles and mumps immunization with 2 doses of MMR vaccine or rubella immunization with 1 dose of MMR vaccine.  Pneumococcal 13-valent conjugate (PCV13) vaccine. When indicated, a person who is uncertain of his immunization history and has no record of immunization should receive the PCV13 vaccine. All adults 65 years of age and older should receive this  vaccine. An adult aged 19 years or older who has certain medical conditions and has not been previously immunized should receive 1 dose of PCV13 vaccine. This PCV13 should be followed with a dose of pneumococcal polysaccharide (PPSV23) vaccine. Adults who are at high risk for pneumococcal disease should obtain the PPSV23 vaccine at least 8 weeks after the dose of PCV13 vaccine. Adults older than 73 years of age who have normal immune system function should obtain the PPSV23 vaccine dose at least 1 year after the dose of PCV13 vaccine.  Pneumococcal polysaccharide (PPSV23) vaccine. When PCV13 is also indicated, PCV13 should be obtained first. All adults aged 65 years and older should be immunized. An adult younger than age 65 years who has certain medical conditions should be immunized. Any person who resides in a nursing home or long-term care facility should be immunized. An adult smoker should be immunized. People with an immunocompromised condition and certain other conditions should receive both PCV13 and PPSV23 vaccines. People with human immunodeficiency virus (HIV) infection should be immunized as soon as possible after diagnosis. Immunization during chemotherapy or radiation therapy should be avoided. Routine use of PPSV23 vaccine is not recommended for American Indians, [place] Natives, or people younger than 65 years unless there are medical conditions that require PPSV23 vaccine. When indicated, people who have unknown immunization and have no record of immunization should receive PPSV23 vaccine. One-time revaccination 5 years after the first dose of PPSV23 is recommended for people aged 19-64 years who have chronic kidney failure, nephrotic syndrome, asplenia, or immunocompromised conditions. People who received 1-2 doses of PPSV23 before age 65 years should receive another dose of PPSV23 vaccine at age 65 years or later if at least 5 years have passed since the previous dose. Doses of PPSV23 are not  needed for people immunized with PPSV23 at or after age 65 years.  Meningococcal vaccine. Adults with asplenia or persistent complement component deficiencies should receive 2 doses of quadrivalent meningococcal conjugate (MenACWY-D) vaccine. The doses should be obtained   at least 2 months apart. Microbiologists working with certain meningococcal bacteria, [place] recruits, people at risk during an outbreak, and people who travel to or live in countries with a high rate of meningitis should be immunized. A first-year college student up through age 34 years who is living in a residence hall should receive a dose if she did not receive a dose on or after her 16th birthday. Adults who have certain high-risk conditions should receive one or more doses of vaccine.  Hepatitis A vaccine. Adults who wish to be protected from this disease, have certain high-risk conditions, work with hepatitis A-infected animals, work in hepatitis A research labs, or travel to or work in countries with a high rate of hepatitis A should be immunized. Adults who were previously unvaccinated and who anticipate close contact with an international adoptee during the first 60 days after arrival in the [place] from a country with a high rate of hepatitis A should be immunized.  Hepatitis B vaccine. Adults who wish to be protected from this disease, have certain high-risk conditions, may be exposed to blood or other infectious body fluids, are household contacts or sex partners of hepatitis B positive people, are clients or workers in certain care facilities, or travel to or work in countries with a high rate of hepatitis B should be immunized.  Haemophilus influenzae type b (Hib) vaccine. A previously unvaccinated person with asplenia or sickle cell disease or having a scheduled splenectomy should receive 1 dose of Hib vaccine. Regardless of previous immunization, a recipient of a hematopoietic stem cell transplant should receive a  3-dose series 6-12 months after her successful transplant. Hib vaccine is not recommended for adults with HIV infection. Preventive Services / Frequency Ages 35 to 4 years  Blood pressure check.** / Every 3-5 years.  Lipid and cholesterol check.** / Every 5 years beginning at age 60.  Clinical breast exam.** / Every 3 years for women in their 71s and 10s.  BRCA-related cancer risk assessment.** / For women who have family members with a BRCA-related cancer (breast, ovarian, tubal, or peritoneal cancers).  Pap test.** / Every 2 years from ages 76 through 26. Every 3 years starting at age 61 through age 76 or 93 with a history of 3 consecutive normal Pap tests.  HPV screening.** / Every 3 years from ages 37 through ages 60 to 51 with a history of 3 consecutive normal Pap tests.  Hepatitis C blood test.** / For any individual with known risks for hepatitis C.  Skin self-exam. / Monthly.  Influenza vaccine. / Every year.  Tetanus, diphtheria, and acellular pertussis (Tdap, Td) vaccine.** / Consult your health care provider. Pregnant women should receive 1 dose of Tdap vaccine during each pregnancy. 1 dose of Td every 10 years.  Varicella vaccine.** / Consult your health care provider. Pregnant females who do not have evidence of immunity should receive the first dose after pregnancy.  HPV vaccine. / 3 doses over 6 months, if 93 and younger. The vaccine is not recommended for use in pregnant females. However, pregnancy testing is not needed before receiving a dose.  Measles, mumps, rubella (MMR) vaccine.** / You need at least 1 dose of MMR if you were born in 1957 or later. You may also need a 2nd dose. For females of childbearing age, rubella immunity should be determined. If there is no evidence of immunity, females who are not pregnant should be vaccinated. If there is no evidence of immunity, females who are  pregnant should delay immunization until after pregnancy.  Pneumococcal  13-valent conjugate (PCV13) vaccine.** / Consult your health care provider.  Pneumococcal polysaccharide (PPSV23) vaccine.** / 1 to 2 doses if you smoke cigarettes or if you have certain conditions.  Meningococcal vaccine.** / 1 dose if you are age 68 to 8 years and a Market researcher living in a residence hall, or have one of several medical conditions, you need to get vaccinated against meningococcal disease. You may also need additional booster doses.  Hepatitis A vaccine.** / Consult your health care provider.  Hepatitis B vaccine.** / Consult your health care provider.  Haemophilus influenzae type b (Hib) vaccine.** / Consult your health care provider. Ages 7 to 53 years  Blood pressure check.** / Every year.  Lipid and cholesterol check.** / Every 5 years beginning at age 25 years.  Lung cancer screening. / Every year if you are aged 11-80 years and have a 30-pack-year history of smoking and currently smoke or have quit within the past 15 years. Yearly screening is stopped once you have quit smoking for at least 15 years or develop a health problem that would prevent you from having lung cancer treatment.  Clinical breast exam.** / Every year after age 48 years.  BRCA-related cancer risk assessment.** / For women who have family members with a BRCA-related cancer (breast, ovarian, tubal, or peritoneal cancers).  Mammogram.** / Every year beginning at age 41 years and continuing for as long as you are in good health. Consult with your health care provider.  Pap test.** / Every 3 years starting at age 65 years through age 37 or 70 years with a history of 3 consecutive normal Pap tests.  HPV screening.** / Every 3 years from ages 72 years through ages 60 to 40 years with a history of 3 consecutive normal Pap tests.  Fecal occult blood test (FOBT) of stool. / Every year beginning at age 21 years and continuing until age 5 years. You may not need to do this test if you get  a colonoscopy every 10 years.  Flexible sigmoidoscopy or colonoscopy.** / Every 5 years for a flexible sigmoidoscopy or every 10 years for a colonoscopy beginning at age 35 years and continuing until age 48 years.  Hepatitis C blood test.** / For all people born from [birth year] through 1965 and any individual with known risks for hepatitis C.  Skin self-exam. / Monthly.  Influenza vaccine. / Every year.  Tetanus, diphtheria, and acellular pertussis (Tdap/Td) vaccine.** / Consult your health care provider. Pregnant women should receive 1 dose of Tdap vaccine during each pregnancy. 1 dose of Td every 10 years.  Varicella vaccine.** / Consult your health care provider. Pregnant females who do not have evidence of immunity should receive the first dose after pregnancy.  Zoster vaccine.** / 1 dose for adults aged 30 years or older.  Measles, mumps, rubella (MMR) vaccine.** / You need at least 1 dose of MMR if you were born in 1957 or later. You may also need a second dose. For females of childbearing age, rubella immunity should be determined. If there is no evidence of immunity, females who are not pregnant should be vaccinated. If there is no evidence of immunity, females who are pregnant should delay immunization until after pregnancy.  Pneumococcal 13-valent conjugate (PCV13) vaccine.** / Consult your health care provider.  Pneumococcal polysaccharide (PPSV23) vaccine.** / 1 to 2 doses if you smoke cigarettes or if you have certain conditions.  Meningococcal vaccine.** /  Consult your health care provider.  Hepatitis A vaccine.** / Consult your health care provider.  Hepatitis B vaccine.** / Consult your health care provider.  Haemophilus influenzae type b (Hib) vaccine.** / Consult your health care provider. Ages 64 years and over  Blood pressure check.** / Every year.  Lipid and cholesterol check.** / Every 5 years beginning at age 23 years.  Lung cancer screening. / Every year if you  are aged 16-80 years and have a 30-pack-year history of smoking and currently smoke or have quit within the past 15 years. Yearly screening is stopped once you have quit smoking for at least 15 years or develop a health problem that would prevent you from having lung cancer treatment.  Clinical breast exam.** / Every year after age 74 years.  BRCA-related cancer risk assessment.** / For women who have family members with a BRCA-related cancer (breast, ovarian, tubal, or peritoneal cancers).  Mammogram.** / Every year beginning at age 44 years and continuing for as long as you are in good health. Consult with your health care provider.  Pap test.** / Every 3 years starting at age 58 years through age 22 or 39 years with 3 consecutive normal Pap tests. Testing can be stopped between 65 and 70 years with 3 consecutive normal Pap tests and no abnormal Pap or HPV tests in the past 10 years.  HPV screening.** / Every 3 years from ages 64 years through ages 70 or 61 years with a history of 3 consecutive normal Pap tests. Testing can be stopped between 65 and 70 years with 3 consecutive normal Pap tests and no abnormal Pap or HPV tests in the past 10 years.  Fecal occult blood test (FOBT) of stool. / Every year beginning at age 40 years and continuing until age 27 years. You may not need to do this test if you get a colonoscopy every 10 years.  Flexible sigmoidoscopy or colonoscopy.** / Every 5 years for a flexible sigmoidoscopy or every 10 years for a colonoscopy beginning at age 7 years and continuing until age 32 years.  Hepatitis C blood test.** / For all people born from [birth year] through 1963 and any individual with known risks for hepatitis C.  Osteoporosis screening.** / A one-time screening for women ages 30 years and over and women at risk for fractures or osteoporosis.  Skin self-exam. / Monthly.  Influenza vaccine. / Every year.  Tetanus, diphtheria, and acellular pertussis (Tdap/Td)  vaccine.** / 1 dose of Td every 10 years.  Varicella vaccine.** / Consult your health care provider.  Zoster vaccine.** / 1 dose for adults aged 35 years or older.  Pneumococcal 13-valent conjugate (PCV13) vaccine.** / Consult your health care provider.  Pneumococcal polysaccharide (PPSV23) vaccine.** / 1 dose for all adults aged 46 years and older.  Meningococcal vaccine.** / Consult your health care provider.  Hepatitis A vaccine.** / Consult your health care provider.  Hepatitis B vaccine.** / Consult your health care provider.  Haemophilus influenzae type b (Hib) vaccine.** / Consult your health care provider. ** Family history and personal history of risk and conditions may change your health care provider's recommendations.   This information is not intended to replace advice given to you by your health care provider. Make sure you discuss any questions you have with your health care provider.   Document Released: 02/11/2002 Document Revised: 01/06/2015 Document Reviewed: 05/13/2011 Elsevier Interactive Patient Education [employer].

## 2016-10-30 NOTE — Progress Notes (Signed)
Pre visit review using our clinic review tool, if applicable. No additional management support is needed unless otherwise documented below in the visit note. 

## 2016-10-30 NOTE — Progress Notes (Signed)
Subjective:  Patient ID: Natalie Shannon, female    DOB: 10/14/1941  Age: 75 y.o. MRN: [id number]  CC: Annual Exam; Hyperlipidemia; Hypothyroidism; Hypertension; and Gastroesophageal Reflux   HPI Natalie Shannon presents for AWV/CPX.  She complains of persistent episodes of heartburn despite taking a PPI.  She complains of fatigue and is concerned that she may need a different thyroid dose.  Her BP has been well controlled on the ARB.  Outpatient Medications Prior to Visit  Medication Sig Dispense Refill  . aspirin 81 MG tablet Take 81 mg by mouth daily.    Marland Kitchen EPINEPHrine 0.3 mg/0.3 mL IJ SOAJ injection Inject into thigh for severe allergic reaction 1 Device 12  . Ergocalciferol (VITAMIN D2) 400 UNITS TABS Take by mouth.    . hydroxypropyl methylcellulose / hypromellose (ISOPTO TEARS / GONIOVISC) 2.5 % ophthalmic solution 1 drop.    Marland Kitchen levothyroxine (SYNTHROID, LEVOTHROID) 100 MCG tablet Take 1 tablet (100 mcg total) by mouth daily before breakfast. 90 tablet 3  . NONFORMULARY OR COMPOUNDED ITEM Allergy Vaccine 1:10 Given at Home    . TUBERCULIN SYR 1CC/25GX5/8" (B-D TB SYRINGE 1CC/25GX5/8") 25G X 5/8" 1 ML MISC Use as directed for allergy injections 100 each 0  . albuterol (PROVENTIL HFA;VENTOLIN HFA) 108 (90 Base) MCG/ACT inhaler Inhale 2 puffs into the lungs every 6 (six) hours as needed for wheezing or shortness of breath. 1 Inhaler prn  . calcium-vitamin D (OSCAL WITH D) 500-200 MG-UNIT per tablet Take 1 tablet by mouth 2 (two) times daily.    . fluticasone (FLOVENT HFA) 110 MCG/ACT inhaler Inhale 2 puffs then rinse mouth, twice daily 1 Inhaler 11  . Fluticasone Furoate (ARNUITY ELLIPTA) 200 MCG/ACT AEPB Inhale 1 puff into the lungs daily. 30 each 12  . losartan (COZAAR) 100 MG tablet Take 1 tablet (100 mg total) by mouth daily. Yearly physical w/labs due in Oct must see Md for refills 30 tablet 0  . Magnesium 250 MG TABS Take by mouth.    . Misc Natural Products  (GLUCOS-CHONDROIT-MSM COMPLEX PO) Take by mouth.    . montelukast (SINGULAIR) 10 MG tablet Take 1 tablet (10 mg total) by mouth at bedtime. 90 tablet 3  . Probiotic Product (PROBIOTIC PO) Take by mouth. gummies BID    . raloxifene (EVISTA) 60 MG tablet Take 1 tablet (60 mg total) by mouth daily. 90 tablet 3  . ranitidine (ZANTAC) 300 MG tablet take 1 tablet by mouth at bedtime 90 tablet 3  . simvastatin (ZOCOR) 40 MG tablet TAKE 1 TABLET DAILY IN THE EVENING. 90 tablet 2   No facility-administered medications prior to visit.     ROS Review of Systems  Constitutional: Positive for fatigue. Negative for activity change, appetite change, chills, fever and unexpected weight change.  HENT: Negative.  Negative for sinus pressure, trouble swallowing and voice change.   Eyes: Negative for photophobia and visual disturbance.  Respiratory: Negative.  Negative for cough, choking, chest tightness, shortness of breath and stridor.   Cardiovascular: Negative for chest pain, palpitations and leg swelling.  Gastrointestinal: Negative.  Negative for abdominal pain, constipation, diarrhea, nausea and vomiting.  Endocrine: Negative for cold intolerance and heat intolerance.  Genitourinary: Negative.  Negative for decreased urine volume, difficulty urinating, dysuria and urgency.  Musculoskeletal: Negative.  Negative for arthralgias, back pain, myalgias and neck pain.  Skin: Negative.  Negative for color change, pallor and rash.  Allergic/Immunologic: Negative.   Neurological: Negative.  Negative for dizziness, tremors, weakness, light-headedness  and numbness.  Hematological: Negative.  Negative for adenopathy. Does not bruise/bleed easily.  Psychiatric/Behavioral: Negative.     Objective:  BP 122/82 (BP Location: Left Arm, Patient Position: Sitting, Cuff Size: Normal)   Pulse 72   Temp 97.8 F (36.6 C) (Oral)   Resp 16   Ht 5\' 5"  (1.651 m)   Wt 167 lb (75.8 kg)   SpO2 91%   BMI 27.79 kg/m   BP  Readings from Last 3 Encounters:  10/30/16 122/82  05/13/16 118/62  10/30/15 126/88    Wt Readings from Last 3 Encounters:  10/30/16 167 lb (75.8 kg)  05/13/16 166 lb 6.4 oz (75.5 kg)  10/30/15 168 lb (76.2 kg)    Physical Exam  Constitutional: She is oriented to person, place, and time. No distress.  HENT:  Mouth/Throat: Oropharynx is clear and moist. No oropharyngeal exudate.  Eyes: Conjunctivae are normal. Right eye exhibits no discharge. Left eye exhibits no discharge. No scleral icterus.  Neck: Normal range of motion. Neck supple. No JVD present. No tracheal deviation present. No thyromegaly present.  Cardiovascular: Normal rate, regular rhythm, normal heart sounds and intact distal pulses.  Exam reveals no gallop and no friction rub.   No murmur heard. Pulmonary/Chest: Effort normal and breath sounds normal. No stridor. No respiratory distress. She has no wheezes. She has no rales. She exhibits no tenderness.  Abdominal: Soft. Bowel sounds are normal. She exhibits no distension and no mass. There is no tenderness. There is no rebound and no guarding.  Genitourinary:  Genitourinary Comments: Breast, GU, rectal exams were deferred at her request  Musculoskeletal: Normal range of motion. She exhibits no edema, tenderness or deformity.  Lymphadenopathy:    She has no cervical adenopathy.  Neurological: She is oriented to person, place, and time.  Skin: Skin is warm and dry. No rash noted. She is not diaphoretic. No erythema. No pallor.  Psychiatric: Her behavior is normal. Judgment and thought content normal.  Vitals reviewed.   Lab Results  Component Value Date   WBC 6.7 10/30/2016   HGB 14.2 10/30/2016   HCT 42.5 10/30/2016   PLT 440.0 (H) 10/30/2016   GLUCOSE 103 (H) 10/30/2016   CHOL 163 10/30/2016   TRIG 234.0 (H) 10/30/2016   HDL 54.60 10/30/2016   LDLDIRECT 89.0 10/30/2016   LDLCALC 53 05/17/2014   ALT 19 10/30/2016   AST 19 10/30/2016   NA 141 10/30/2016   K  4.1 10/30/2016   CL 105 10/30/2016   CREATININE 1.12 10/30/2016   BUN 22 10/30/2016   CO2 25 10/30/2016   TSH 3.28 10/30/2016   INR 1.01 05/15/2012   HGBA1C 6.3 10/30/2016    Dg Chest 2 View  Result Date: 03/12/2013 *RADIOLOGY REPORT* Clinical Data: Physical exam.  Asthma, prior smoker. CHEST - 2 VIEW Comparison: 05/15/2012 Findings: Heart is normal size.  Lungs are clear.  No effusions. Old healed bilateral rib fractures.  No visible acute fracture. IMPRESSION: No acute cardiopulmonary disease. Original Report Authenticated By: Rolm Baptise, M.D.    Assessment & Plan:   Jhordan was seen today for annual exam, hyperlipidemia, hypothyroidism, hypertension and gastroesophageal reflux.  Diagnoses and all orders for this visit:  Essential hypertension- her BP is well controlled, lytes and renal fxn are normal -     Comprehensive metabolic panel; Future  Hypothyroidism, unspecified type- her TSH is on the normal range. Will remain on the current T dose -     TSH; Future  Hyperglycemia- she is prediabetc  with an A1C of 6.3%, no meds are needed, she will work on her lifestyle modifications -     Comprehensive metabolic panel; Future -     Hemoglobin A1c; Future  Pure hyperglyceridemia- improvement noted, she continues to work on her diet -     Lipid panel; Future  Gastroesophageal reflux disease without esophagitis- will add gaviscon for additional sx relief -     Alum Hydroxide-Mag Carbonate (GAVISCON EXTRA STRENGTH) 160-105 MG CHEW; Chew 1 tablet by mouth 3 (three) times daily with meals as needed. -     CBC with Differential/Platelet; Future  Hyperlipidemia LDL goal <130- Framingham risk score is 6% so I do not recommend a statin -     Lipid panel; Future  Need for prophylactic vaccination against Streptococcus pneumoniae (pneumococcus) -     Pneumococcal conjugate vaccine 13-valent  Other orders -     albuterol (PROVENTIL HFA;VENTOLIN HFA) 108 (90 Base) MCG/ACT inhaler; Inhale 2  puffs into the lungs every 6 (six) hours as needed for wheezing or shortness of breath. -     Fluticasone Furoate (ARNUITY ELLIPTA) 200 MCG/ACT AEPB; Inhale 1 puff into the lungs daily. -     Discontinue: fluticasone (FLOVENT HFA) 110 MCG/ACT inhaler; Inhale 2 puffs then rinse mouth, twice daily -     losartan (COZAAR) 100 MG tablet; Take 1 tablet (100 mg total) by mouth daily. -     montelukast (SINGULAIR) 10 MG tablet; Take 1 tablet (10 mg total) by mouth at bedtime. -     raloxifene (EVISTA) 60 MG tablet; Take 1 tablet (60 mg total) by mouth daily. -     ranitidine (ZANTAC) 300 MG tablet; take 1 tablet by mouth at bedtime -     simvastatin (ZOCOR) 40 MG tablet; Take 1 tablet (40 mg total) by mouth daily.   I have discontinued Ms. Mayfield's calcium-vitamin D, Misc Natural Products (GLUCOS-CHONDROIT-MSM COMPLEX PO), Magnesium, Probiotic Product (PROBIOTIC PO), fluticasone, and fluticasone. I have also changed her losartan and simvastatin. Additionally, I am having her start on Alum Hydroxide-Mag Carbonate. Lastly, I am having her maintain her aspirin, TUBERCULIN SYR 1CC/25GX5/8", hydroxypropyl methylcellulose / hypromellose, Vitamin D2, NONFORMULARY OR COMPOUNDED ITEM, levothyroxine, EPINEPHrine, PULMICORT FLEXHALER, augmented betamethasone dipropionate, albuterol, Fluticasone Furoate, montelukast, raloxifene, and ranitidine.  Meds ordered this encounter  Medications  . PULMICORT FLEXHALER 180 MCG/ACT inhaler    Refill:  0  . augmented betamethasone dipropionate (DIPROLENE-AF) 0.05 % ointment    Refill:  0  . albuterol (PROVENTIL HFA;VENTOLIN HFA) 108 (90 Base) MCG/ACT inhaler    Sig: Inhale 2 puffs into the lungs every 6 (six) hours as needed for wheezing or shortness of breath.    Dispense:  1 Inhaler    Refill:  prn  . Fluticasone Furoate (ARNUITY ELLIPTA) 200 MCG/ACT AEPB    Sig: Inhale 1 puff into the lungs daily.    Dispense:  90 each    Refill:  3    Please disregard order for  100.  Marland Kitchen DISCONTD: fluticasone (FLOVENT HFA) 110 MCG/ACT inhaler    Sig: Inhale 2 puffs then rinse mouth, twice daily    Dispense:  13 Inhaler    Refill:  3  . losartan (COZAAR) 100 MG tablet    Sig: Take 1 tablet (100 mg total) by mouth daily.    Dispense:  90 tablet    Refill:  3  . montelukast (SINGULAIR) 10 MG tablet    Sig: Take 1 tablet (10 mg total) by mouth at  bedtime.    Dispense:  90 tablet    Refill:  3  . raloxifene (EVISTA) 60 MG tablet    Sig: Take 1 tablet (60 mg total) by mouth daily.    Dispense:  90 tablet    Refill:  3  . ranitidine (ZANTAC) 300 MG tablet    Sig: take 1 tablet by mouth at bedtime    Dispense:  90 tablet    Refill:  3  . simvastatin (ZOCOR) 40 MG tablet    Sig: Take 1 tablet (40 mg total) by mouth daily.    Dispense:  90 tablet    Refill:  3  . Alum Hydroxide-Mag Carbonate (GAVISCON EXTRA STRENGTH) 160-105 MG CHEW    Sig: Chew 1 tablet by mouth 3 (three) times daily with meals as needed.    Dispense:  90 tablet    Refill:  11   See AVS for instructions about healthy living and anticipatory guidance.  Follow-up: Return in about 4 months (around 02/27/2017).  Scarlette Calico, MD

## 2016-11-01 NOTE — Assessment & Plan Note (Signed)

## 2016-11-11 ENCOUNTER — Telehealth: Payer: Self-pay | Admitting: Internal Medicine

## 2016-11-11 NOTE — Telephone Encounter (Signed)
Patient states that Dr. Ronnald Ramp wanted her to follow up on B12.  Does she need to make OV with [facility] or go to lab?

## 2016-11-11 NOTE — Telephone Encounter (Signed)
Pt to follow up in 3-4 months

## 2016-11-11 NOTE — Telephone Encounter (Signed)
11/13/2016 appt scheduled  Platelet elevation caused by B12 deficiency.

## 2016-11-11 NOTE — Telephone Encounter (Signed)
Patient stated that Dr. Ronnald Ramp wanted to see her sooner.  I have scheduled her for Wed.

## 2016-11-12 ENCOUNTER — Telehealth: Payer: Self-pay | Admitting: Internal Medicine

## 2016-11-12 DIAGNOSIS — J309 Allergic rhinitis, unspecified: Secondary | ICD-10-CM | POA: Diagnosis not present

## 2016-11-12 NOTE — Telephone Encounter (Signed)
Allergy Serum Extract Date Mixed: 11/12/16 Vial: 2 Strength: 1:10 Here/Mail/Pick Up: mail Mixed By: tbs Last OV: 05/11/16 Pending OV: N/A

## 2016-11-12 NOTE — Telephone Encounter (Addendum)
Called pt. This morning, vac. Exp. In Oct. Not Nov.. Updated pt.'s address, will send to her asap. Nothing further needed.

## 2016-11-13 ENCOUNTER — Other Ambulatory Visit: Payer: Self-pay | Admitting: Internal Medicine

## 2016-11-13 ENCOUNTER — Other Ambulatory Visit (INDEPENDENT_AMBULATORY_CARE_PROVIDER_SITE_OTHER): Payer: Medicare Other

## 2016-11-13 ENCOUNTER — Ambulatory Visit (INDEPENDENT_AMBULATORY_CARE_PROVIDER_SITE_OTHER): Payer: Medicare Other | Admitting: Internal Medicine

## 2016-11-13 ENCOUNTER — Encounter: Payer: Self-pay | Admitting: Internal Medicine

## 2016-11-13 VITALS — BP 112/70 | HR 89 | Temp 97.6°F | Resp 16 | Ht 65.0 in | Wt 169.0 lb

## 2016-11-13 DIAGNOSIS — D473 Essential (hemorrhagic) thrombocythemia: Secondary | ICD-10-CM | POA: Diagnosis not present

## 2016-11-13 DIAGNOSIS — I10 Essential (primary) hypertension: Secondary | ICD-10-CM | POA: Diagnosis not present

## 2016-11-13 DIAGNOSIS — J4520 Mild intermittent asthma, uncomplicated: Secondary | ICD-10-CM | POA: Diagnosis not present

## 2016-11-13 DIAGNOSIS — D75839 Thrombocytosis, unspecified: Secondary | ICD-10-CM

## 2016-11-13 DIAGNOSIS — E038 Other specified hypothyroidism: Secondary | ICD-10-CM

## 2016-11-13 LAB — CBC WITH DIFFERENTIAL/PLATELET
BASOS ABS: 0 10*3/uL (ref 0.0–0.1)
Basophils Relative: 0.6 % (ref 0.0–3.0)
EOS ABS: 0.3 10*3/uL (ref 0.0–0.7)
Eosinophils Relative: 4.1 % (ref 0.0–5.0)
HCT: 42.8 % (ref 36.0–46.0)
Hemoglobin: 14.3 g/dL (ref 12.0–15.0)
LYMPHS ABS: 1 10*3/uL (ref 0.7–4.0)
LYMPHS PCT: 14.2 % (ref 12.0–46.0)
MCHC: 33.5 g/dL (ref 30.0–36.0)
MCV: 85.9 fl (ref 78.0–100.0)
MONO ABS: 0.4 10*3/uL (ref 0.1–1.0)
Monocytes Relative: 6 % (ref 3.0–12.0)
NEUTROS ABS: 5.1 10*3/uL (ref 1.4–7.7)
NEUTROS PCT: 75.1 % (ref 43.0–77.0)
PLATELETS: 370 10*3/uL (ref 150.0–400.0)
RBC: 4.98 Mil/uL (ref 3.87–5.11)
RDW: 14.2 % (ref 11.5–15.5)
WBC: 6.9 10*3/uL (ref 4.0–10.5)

## 2016-11-13 LAB — FOLATE

## 2016-11-13 LAB — VITAMIN B12: VITAMIN B 12: 522 pg/mL (ref 211–911)

## 2016-11-13 NOTE — Patient Instructions (Signed)
Platelet Count Test Why am I having this test? The platelet count test is performed to obtain a measure of how many platelets you have within a specific amount (volume) of blood. Platelets are specialized cells made in your bone marrow that gather at the site of tissue injury to stop bleeding. Most of the platelets in your body can be found in your bloodstream. Fewer platelets are stored in your liver and spleen. Your health care provider may want you to have this test if you have a rash or other medical condition that suggests that you have a low platelet count. This may include one of these conditions that cause excess bleeding or delayed blood clotting, such as:  Petechiae. These are small hemorrhages in the skin that cause a rash. The rash appears as a collection of red-purple pinprick-sized dots.  Heavy menstrual bleeding, if you are female.  Thrombocytopenia. This is a condition in which you have a low platelet count.  Bone marrow failure. This test may also be used to monitor treatment for those conditions. What kind of sample is taken? A blood sample is required for this test. It is usually collected by inserting a needle into a vein or by sticking a finger with a small needle. How do I prepare for this test? There is no preparation or fasting required for this test. What are the reference ranges? Reference ranges are considered healthy ranges established after testing a large group of healthy people. Reference ranges may vary among different people, labs, and hospitals. It is your responsibility to obtain your test results. Ask the lab or department performing the test when and how you will get your results.  Adult or elderly: 150,000-400,000 per microliter.  Child: 150,000-400,000 per microliter.  Infant: 200,000-475,000 per microliter.  Premature infant: 100,000-300,000 per microliter.  Newborn: 150,000-300,000 per microliter. What do the results mean? Results that are higher  than the reference range can indicate a number of health conditions. These may include:  Certain types of cancer, such as leukemia or lymphoma.  Polycythemia vera. This is a condition in which the bone marrow is producing excess amounts of all cell types, including platelets.  Postsplenectomy syndrome. This is a condition that can occur after surgery that is done to remove the spleen (splenectomy).  Rheumatoid arthritis.  Iron-deficiency anemia. Results that are lower than the reference range can also indicate a number of health conditions. These may include:  Hypersplenism. This is a condition in which the spleen is breaking down platelets faster than normal.  Bleeding (hemorrhage).  Immune thrombocytopenia.  Cancer or chemotherapy treatments for cancers such as leukemia.  Thrombotic thrombocytopenia.  HELLP syndrome.  Abnormal thyroid gland function, such as in Graves disease.  Certain inherited disorders that cause a decreased platelet count.  Disseminated intravascular coagulation (DIC). This is a condition in which abnormal blood-clotting processes occur.  Systemic lupus erythematosus (SLE).  Certain types of anemia, such as pernicious and hemolytic anemias.  Infection. Talk with your health care provider to discuss your results, treatment options, and if necessary, the need for more tests. Talk with your health care provider if you have any questions about your results. Talk with your health care provider to discuss your results, treatment options, and if necessary, the need for more tests. Talk with your health care provider if you have any questions about your results. This information is not intended to replace advice given to you by your health care provider. Make sure you discuss any questions you have  with your health care provider. Document Released: 01/10/2005 Document Revised: 08/21/2016 Document Reviewed: 05/11/2014 Elsevier Interactive Patient Education  2017  [employer].

## 2016-11-13 NOTE — Progress Notes (Signed)
Pre visit review using our clinic review tool, if applicable. No additional management support is needed unless otherwise documented below in the visit note. 

## 2016-11-13 NOTE — Progress Notes (Signed)
Subjective:  Patient ID: Natalie Shannon, female    DOB: 1941-06-18  Age: 75 y.o. MRN: [id number]  CC: Hypertension and Asthma   HPI Natalie Shannon presents for follow-up after recent lab work showed a slightly elevated platelet count. She has had no episodes of bleeding or bruising. She denies paresthesias, fever, chills, night sweats, rash, or lymphadenopathy. She wants a referral to a new allergist since her current allergist is retiring.  Outpatient Medications Prior to Visit  Medication Sig Dispense Refill  . albuterol (PROVENTIL HFA;VENTOLIN HFA) 108 (90 Base) MCG/ACT inhaler Inhale 2 puffs into the lungs every 6 (six) hours as needed for wheezing or shortness of breath. 1 Inhaler prn  . Alum Hydroxide-Mag Carbonate (GAVISCON EXTRA STRENGTH) 160-105 MG CHEW Chew 1 tablet by mouth 3 (three) times daily with meals as needed. 90 tablet 11  . aspirin 81 MG tablet Take 81 mg by mouth daily.    Marland Kitchen augmented betamethasone dipropionate (DIPROLENE-AF) 0.05 % ointment   0  . EPINEPHrine 0.3 mg/0.3 mL IJ SOAJ injection Inject into thigh for severe allergic reaction 1 Device 12  . Ergocalciferol (VITAMIN D2) 400 UNITS TABS Take by mouth.    . Fluticasone Furoate (ARNUITY ELLIPTA) 200 MCG/ACT AEPB Inhale 1 puff into the lungs daily. 90 each 3  . hydroxypropyl methylcellulose / hypromellose (ISOPTO TEARS / GONIOVISC) 2.5 % ophthalmic solution 1 drop.    Marland Kitchen losartan (COZAAR) 100 MG tablet Take 1 tablet (100 mg total) by mouth daily. 90 tablet 3  . montelukast (SINGULAIR) 10 MG tablet Take 1 tablet (10 mg total) by mouth at bedtime. 90 tablet 3  . NONFORMULARY OR COMPOUNDED ITEM Allergy Vaccine 1:10 Given at Home    . PULMICORT FLEXHALER 180 MCG/ACT inhaler   0  . raloxifene (EVISTA) 60 MG tablet Take 1 tablet (60 mg total) by mouth daily. 90 tablet 3  . ranitidine (ZANTAC) 300 MG tablet take 1 tablet by mouth at bedtime 90 tablet 3  . simvastatin (ZOCOR) 40 MG tablet Take 1 tablet (40 mg total)  by mouth daily. 90 tablet 3  . TUBERCULIN SYR 1CC/25GX5/8" (B-D TB SYRINGE 1CC/25GX5/8") 25G X 5/8" 1 ML MISC Use as directed for allergy injections 100 each 0  . levothyroxine (SYNTHROID, LEVOTHROID) 100 MCG tablet Take 1 tablet (100 mcg total) by mouth daily before breakfast. 90 tablet 3   No facility-administered medications prior to visit.     ROS Review of Systems  Constitutional: Negative for chills, fatigue, fever and unexpected weight change.  HENT: Negative for facial swelling, nosebleeds, sinus pressure, sore throat and trouble swallowing.   Eyes: Negative for visual disturbance.  Respiratory: Negative for cough, choking, chest tightness, shortness of breath and stridor.   Cardiovascular: Negative.  Negative for chest pain, palpitations and leg swelling.  Gastrointestinal: Negative.  Negative for abdominal pain, constipation, diarrhea, nausea and vomiting.  Endocrine: Negative.   Genitourinary: Negative.   Musculoskeletal: Negative.  Negative for back pain and myalgias.  Skin: Negative.  Negative for color change and rash.  Allergic/Immunologic: Negative.   Neurological: Negative.  Negative for dizziness, weakness, light-headedness and numbness.  Hematological: Negative.  Negative for adenopathy. Does not bruise/bleed easily.  Psychiatric/Behavioral: Negative.     Objective:  BP 112/70 (BP Location: Left Arm, Patient Position: Sitting, Cuff Size: Normal)   Pulse 89   Temp 97.6 F (36.4 C) (Oral)   Resp 16   Ht 5\' 5"  (1.651 m)   Wt 169 lb (76.7 kg)  SpO2 97%   BMI 28.12 kg/m   BP Readings from Last 3 Encounters:  11/13/16 112/70  10/30/16 122/82  05/13/16 118/62    Wt Readings from Last 3 Encounters:  11/13/16 169 lb (76.7 kg)  10/30/16 167 lb (75.8 kg)  05/13/16 166 lb 6.4 oz (75.5 kg)    Physical Exam  Constitutional: She is oriented to person, place, and time. No distress.  HENT:  Mouth/Throat: Oropharynx is clear and moist. No oropharyngeal exudate.    Eyes: Conjunctivae are normal. Right eye exhibits no discharge. Left eye exhibits no discharge. No scleral icterus.  Neck: Normal range of motion. Neck supple. No JVD present. No tracheal deviation present. No thyromegaly present.  Cardiovascular: Normal rate, regular rhythm, normal heart sounds and intact distal pulses.  Exam reveals no gallop and no friction rub.   No murmur heard. Pulmonary/Chest: Effort normal and breath sounds normal. No stridor. No respiratory distress. She has no wheezes. She has no rales. She exhibits no tenderness.  Abdominal: Soft. Bowel sounds are normal. She exhibits no distension and no mass. There is no tenderness. There is no rebound and no guarding.  Musculoskeletal: Normal range of motion. She exhibits no edema, tenderness or deformity.  Lymphadenopathy:    She has no cervical adenopathy.  Neurological: She is oriented to person, place, and time.  Skin: Skin is warm and dry. No rash noted. She is not diaphoretic. No erythema. No pallor.  Vitals reviewed.   Lab Results  Component Value Date   WBC 6.9 11/13/2016   HGB 14.3 11/13/2016   HCT 42.8 11/13/2016   PLT 370.0 11/13/2016   GLUCOSE 103 (H) 10/30/2016   CHOL 163 10/30/2016   TRIG 234.0 (H) 10/30/2016   HDL 54.60 10/30/2016   LDLDIRECT 89.0 10/30/2016   LDLCALC 53 05/17/2014   ALT 19 10/30/2016   AST 19 10/30/2016   NA 141 10/30/2016   K 4.1 10/30/2016   CL 105 10/30/2016   CREATININE 1.12 10/30/2016   BUN 22 10/30/2016   CO2 25 10/30/2016   TSH 3.28 10/30/2016   INR 1.01 05/15/2012   HGBA1C 6.3 10/30/2016    Dg Chest 2 View  Result Date: 03/12/2013 *RADIOLOGY REPORT* Clinical Data: Physical exam.  Asthma, prior smoker. CHEST - 2 VIEW Comparison: 05/15/2012 Findings: Heart is normal size.  Lungs are clear.  No effusions. Old healed bilateral rib fractures.  No visible acute fracture. IMPRESSION: No acute cardiopulmonary disease. Original Report Authenticated By: Rolm Baptise, M.D.     Assessment & Plan:   Natalie Shannon was seen today for hypertension and asthma.  Diagnoses and all orders for this visit:  Asthma, mild intermittent, well-controlled -     Ambulatory referral to Allergy  Thrombocytosis ([place])- her PLT ct is normal now, the remainder of the CBC is normal. Her B12 level is normal. No further evaluation needed at this time. -     CBC with Differential/Platelet; Future -     Folate; Future -     Vitamin B12; Future -     Methylmalonic acid, serum; Future  Essential hypertension- her BP is well controlled.   I am having Ms. Benn maintain her aspirin, TUBERCULIN SYR 1CC/25GX5/8", hydroxypropyl methylcellulose / hypromellose, Vitamin D2, NONFORMULARY OR COMPOUNDED ITEM, EPINEPHrine, PULMICORT FLEXHALER, augmented betamethasone dipropionate, albuterol, Fluticasone Furoate, losartan, montelukast, raloxifene, ranitidine, simvastatin, and Alum Hydroxide-Mag Carbonate.  No orders of the defined types were placed in this encounter.    Follow-up: Return in about 4 months (around 03/13/2017).  Marcello Moores  Ronnald Ramp, MD

## 2016-11-16 ENCOUNTER — Encounter: Payer: Self-pay | Admitting: Internal Medicine

## 2016-11-16 LAB — METHYLMALONIC ACID, SERUM: METHYLMALONIC ACID, QUANT: 196 nmol/L (ref 87–318)

## 2016-11-20 ENCOUNTER — Other Ambulatory Visit: Payer: Self-pay | Admitting: Internal Medicine

## 2017-03-13 ENCOUNTER — Ambulatory Visit (INDEPENDENT_AMBULATORY_CARE_PROVIDER_SITE_OTHER): Payer: Medicare Other | Admitting: Internal Medicine

## 2017-03-13 ENCOUNTER — Other Ambulatory Visit (INDEPENDENT_AMBULATORY_CARE_PROVIDER_SITE_OTHER): Payer: Medicare Other

## 2017-03-13 ENCOUNTER — Encounter: Payer: Self-pay | Admitting: Internal Medicine

## 2017-03-13 VITALS — BP 108/68 | HR 75 | Temp 97.5°F | Resp 16 | Ht 65.0 in | Wt 170.0 lb

## 2017-03-13 DIAGNOSIS — E038 Other specified hypothyroidism: Secondary | ICD-10-CM

## 2017-03-13 DIAGNOSIS — D473 Essential (hemorrhagic) thrombocythemia: Secondary | ICD-10-CM

## 2017-03-13 DIAGNOSIS — J301 Allergic rhinitis due to pollen: Secondary | ICD-10-CM | POA: Insufficient documentation

## 2017-03-13 DIAGNOSIS — D75839 Thrombocytosis, unspecified: Secondary | ICD-10-CM

## 2017-03-13 DIAGNOSIS — Z23 Encounter for immunization: Secondary | ICD-10-CM | POA: Diagnosis not present

## 2017-03-13 LAB — CBC WITH DIFFERENTIAL/PLATELET
BASOS ABS: 0.1 10*3/uL (ref 0.0–0.1)
Basophils Relative: 1 % (ref 0.0–3.0)
EOS PCT: 5.8 % — AB (ref 0.0–5.0)
Eosinophils Absolute: 0.4 10*3/uL (ref 0.0–0.7)
HCT: 43.7 % (ref 36.0–46.0)
Hemoglobin: 14.4 g/dL (ref 12.0–15.0)
LYMPHS PCT: 17.4 % (ref 12.0–46.0)
Lymphs Abs: 1.2 10*3/uL (ref 0.7–4.0)
MCHC: 32.9 g/dL (ref 30.0–36.0)
MCV: 87.4 fl (ref 78.0–100.0)
Monocytes Absolute: 0.4 10*3/uL (ref 0.1–1.0)
Monocytes Relative: 6.5 % (ref 3.0–12.0)
NEUTROS ABS: 4.7 10*3/uL (ref 1.4–7.7)
Neutrophils Relative %: 69.3 % (ref 43.0–77.0)
Platelets: 349 10*3/uL (ref 150.0–400.0)
RBC: 5 Mil/uL (ref 3.87–5.11)
RDW: 13.8 % (ref 11.5–15.5)
WBC: 6.8 10*3/uL (ref 4.0–10.5)

## 2017-03-13 LAB — TSH: TSH: 14.82 u[IU]/mL — ABNORMAL HIGH (ref 0.35–4.50)

## 2017-03-13 MED ORDER — LEVOTHYROXINE SODIUM 125 MCG PO TABS: 125.0000 ug | ORAL_TABLET | Freq: Every day | ORAL | 1 refills | Status: DC

## 2017-03-13 MED ORDER — LEVOCETIRIZINE DIHYDROCHLORIDE 5 MG PO TABS: 5.0000 mg | ORAL_TABLET | Freq: Every evening | ORAL | 3 refills | Status: DC

## 2017-03-13 MED ORDER — TRIAMCINOLONE ACETONIDE 55 MCG/ACT NA AERO: 4.0000 | INHALATION_SPRAY | Freq: Every day | NASAL | 11 refills | Status: DC

## 2017-03-13 NOTE — Progress Notes (Signed)
Pre visit review using our clinic review tool, if applicable. No additional management support is needed unless otherwise documented below in the visit note. 

## 2017-03-13 NOTE — Patient Instructions (Signed)
Hypothyroidism Hypothyroidism is a disorder of the thyroid. The thyroid is a large gland that is located in the lower front of the neck. The thyroid releases hormones that control how the body works. With hypothyroidism, the thyroid does not make enough of these hormones. What are the causes? Causes of hypothyroidism may include:  Viral infections.  Pregnancy.  Your own defense system (immune system) attacking your thyroid.  Certain medicines.  Birth defects.  Past radiation treatments to your head or neck.  Past treatment with radioactive iodine.  Past surgical removal of part or all of your thyroid.  Problems with the gland that is located in the center of your brain (pituitary).  What are the signs or symptoms? Signs and symptoms of hypothyroidism may include:  Feeling as though you have no energy (lethargy).  Inability to tolerate cold.  Weight gain that is not explained by a change in diet or exercise habits.  Dry skin.  Coarse hair.  Menstrual irregularity.  Slowing of thought processes.  Constipation.  Sadness or depression.  How is this diagnosed? Your health care provider may diagnose hypothyroidism with blood tests and ultrasound tests. How is this treated? Hypothyroidism is treated with medicine that replaces the hormones that your body does not make. After you begin treatment, it may take several weeks for symptoms to go away. Follow these instructions at home:  Take medicines only as directed by your health care provider.  If you start taking any new medicines, tell your health care provider.  Keep all follow-up visits as directed by your health care provider. This is important. As your condition improves, your dosage needs may change. You will need to have blood tests regularly so that your health care provider can watch your condition. Contact a health care provider if:  Your symptoms do not get better with treatment.  You are taking thyroid  replacement medicine and: ? You sweat excessively. ? You have tremors. ? You feel anxious. ? You lose weight rapidly. ? You cannot tolerate heat. ? You have emotional swings. ? You have diarrhea. ? You feel weak. Get help right away if:  You develop chest pain.  You develop an irregular heartbeat.  You develop a rapid heartbeat. This information is not intended to replace advice given to you by your health care provider. Make sure you discuss any questions you have with your health care provider. Document Released: 12/16/2005 Document Revised: 05/23/2016 Document Reviewed: 05/03/2014 Elsevier Interactive Patient Education  2017 [employer]  

## 2017-03-13 NOTE — Progress Notes (Signed)
Subjective:  Patient ID: Natalie Shannon, female    DOB: [birth year]  Age: 76 y.o. MRN: [id number]  CC: Allergic Rhinitis ; Hypertension; and Hypothyroidism   HPI Natalie Shannon presents for f/up - He complains of nasal allergies and fatigue.  Outpatient Medications Prior to Visit  Medication Sig Dispense Refill  . albuterol (PROVENTIL HFA;VENTOLIN HFA) 108 (90 Base) MCG/ACT inhaler Inhale 2 puffs into the lungs every 6 (six) hours as needed for wheezing or shortness of breath. 1 Inhaler prn  . Alum Hydroxide-Mag Carbonate (GAVISCON EXTRA STRENGTH) 160-105 MG CHEW Chew 1 tablet by mouth 3 (three) times daily with meals as needed. 90 tablet 11  . aspirin 81 MG tablet Take 81 mg by mouth daily.    Marland Kitchen augmented betamethasone dipropionate (DIPROLENE-AF) 0.05 % ointment   0  . EPINEPHrine 0.3 mg/0.3 mL IJ SOAJ injection Inject into thigh for severe allergic reaction 1 Device 12  . Ergocalciferol (VITAMIN D2) 400 UNITS TABS Take by mouth.    . Fluticasone Furoate (ARNUITY ELLIPTA) 200 MCG/ACT AEPB Inhale 1 puff into the lungs daily. 90 each 3  . hydroxypropyl methylcellulose / hypromellose (ISOPTO TEARS / GONIOVISC) 2.5 % ophthalmic solution 1 drop.    Marland Kitchen losartan (COZAAR) 100 MG tablet Take 1 tablet (100 mg total) by mouth daily. 90 tablet 3  . montelukast (SINGULAIR) 10 MG tablet Take 1 tablet (10 mg total) by mouth at bedtime. 90 tablet 3  . NONFORMULARY OR COMPOUNDED ITEM Allergy Vaccine 1:10 Given at Home    . PULMICORT FLEXHALER 180 MCG/ACT inhaler inhale 2 puffs by mouth twice a day and then Rinse mouth after use 3 Inhaler 3  . raloxifene (EVISTA) 60 MG tablet Take 1 tablet (60 mg total) by mouth daily. 90 tablet 3  . ranitidine (ZANTAC) 300 MG tablet take 1 tablet by mouth at bedtime 90 tablet 3  . simvastatin (ZOCOR) 40 MG tablet Take 1 tablet (40 mg total) by mouth daily. 90 tablet 3  . TUBERCULIN SYR 1CC/25GX5/8" (B-D TB SYRINGE 1CC/25GX5/8") 25G X 5/8" 1 ML MISC Use as directed  for allergy injections 100 each 0  . levothyroxine (SYNTHROID, LEVOTHROID) 100 MCG tablet take 1 tablet by mouth once daily BEFORE BREAKFAST 90 tablet 1   No facility-administered medications prior to visit.     ROS Review of Systems  Constitutional: Positive for fatigue. Negative for diaphoresis and fever.  HENT: Positive for congestion, postnasal drip and rhinorrhea. Negative for nosebleeds, sinus pain, sinus pressure, sneezing, sore throat, tinnitus, trouble swallowing and voice change.   Eyes: Negative.   Respiratory: Negative.  Negative for chest tightness, shortness of breath, wheezing and stridor.   Cardiovascular: Negative for chest pain, palpitations and leg swelling.  Gastrointestinal: Negative for abdominal pain, constipation, diarrhea, nausea and vomiting.  Endocrine: Negative.  Negative for cold intolerance and heat intolerance.  Genitourinary: Negative.  Negative for difficulty urinating.  Musculoskeletal: Negative.  Negative for back pain and neck pain.  Skin: Negative.  Negative for color change and rash.  Allergic/Immunologic: Negative.   Neurological: Negative.  Negative for dizziness.  Hematological: Negative.  Negative for adenopathy. Does not bruise/bleed easily.  Psychiatric/Behavioral: Negative.     Objective:  BP 108/68 (BP Location: Left Arm, Patient Position: Sitting, Cuff Size: Normal)   Pulse 75   Temp 97.5 F (36.4 C) (Oral)   Resp 16   Ht 5\' 5"  (1.651 m)   Wt 170 lb (77.1 kg)   SpO2 94%   BMI 28.29  kg/m   BP Readings from Last 3 Encounters:  03/13/17 108/68  11/13/16 112/70  10/30/16 122/82    Wt Readings from Last 3 Encounters:  03/13/17 170 lb (77.1 kg)  11/13/16 169 lb (76.7 kg)  10/30/16 167 lb (75.8 kg)    Physical Exam  Constitutional: She is oriented to person, place, and time. No distress.  HENT:  Mouth/Throat: Oropharynx is clear and moist. No oropharyngeal exudate.  Eyes: Conjunctivae are normal. Right eye exhibits no  discharge. Left eye exhibits no discharge. No scleral icterus.  Neck: Normal range of motion. Neck supple. No JVD present. No tracheal deviation present. No thyromegaly present.  Cardiovascular: Normal rate, regular rhythm, normal heart sounds and intact distal pulses.  Exam reveals no gallop and no friction rub.   No murmur heard. Pulmonary/Chest: Effort normal and breath sounds normal. No stridor. No respiratory distress. She has no wheezes. She has no rales. She exhibits no tenderness.  Abdominal: Soft. Bowel sounds are normal. She exhibits no distension and no mass. There is no tenderness. There is no rebound and no guarding.  Musculoskeletal: Normal range of motion. She exhibits no edema, tenderness or deformity.  Lymphadenopathy:    She has no cervical adenopathy.  Neurological: She is oriented to person, place, and time.  Skin: Skin is warm and dry. No rash noted. She is not diaphoretic. No erythema. No pallor.  Vitals reviewed.   Lab Results  Component Value Date   WBC 6.8 03/13/2017   HGB 14.4 03/13/2017   HCT 43.7 03/13/2017   PLT 349.0 03/13/2017   GLUCOSE 103 (H) 10/30/2016   CHOL 163 10/30/2016   TRIG 234.0 (H) 10/30/2016   HDL 54.60 10/30/2016   LDLDIRECT 89.0 10/30/2016   LDLCALC 53 05/17/2014   ALT 19 10/30/2016   AST 19 10/30/2016   NA 141 10/30/2016   K 4.1 10/30/2016   CL 105 10/30/2016   CREATININE 1.12 10/30/2016   BUN 22 10/30/2016   CO2 25 10/30/2016   TSH 14.82 (H) 03/13/2017   INR 1.01 05/15/2012   HGBA1C 6.3 10/30/2016    Dg Chest 2 View  Result Date: 03/12/2013 *RADIOLOGY REPORT* Clinical Data: Physical exam.  Asthma, prior smoker. CHEST - 2 VIEW Comparison: 05/15/2012 Findings: Heart is normal size.  Lungs are clear.  No effusions. Old healed bilateral rib fractures.  No visible acute fracture. IMPRESSION: No acute cardiopulmonary disease. Original Report Authenticated By: Rolm Baptise, M.D.    Assessment & Plan:   Delayza was seen today for  allergic rhinitis , hypertension and hypothyroidism.  Diagnoses and all orders for this visit:  Other specified hypothyroidism- her TSH is elevated and she complains of fatigue so I will increase her levothyroxine dose. -     TSH; Future -     levothyroxine (SYNTHROID, LEVOTHROID) 125 MCG tablet; Take 1 tablet (125 mcg total) by mouth daily.  Thrombocytosis ([place])- her platelet count remains normal. -     CBC with Differential/Platelet; Future  Acute nonseasonal allergic rhinitis due to pollen -     levocetirizine (XYZAL) 5 MG tablet; Take 1 tablet (5 mg total) by mouth every evening. -     triamcinolone (NASACORT ALLERGY 24HR) 55 MCG/ACT AERO nasal inhaler; Place 4 sprays into the nose daily.  Need for Streptococcus pneumoniae vaccination -     Pneumococcal polysaccharide vaccine 23-valent greater than or equal to 2yo subcutaneous/IM   I have discontinued Ms. Mckenzie's levothyroxine. I am also having her start on levocetirizine, triamcinolone, and levothyroxine.  Additionally, I am having her maintain her aspirin, TUBERCULIN SYR 1CC/25GX5/8", hydroxypropyl methylcellulose / hypromellose, Vitamin D2, NONFORMULARY OR COMPOUNDED ITEM, EPINEPHrine, augmented betamethasone dipropionate, albuterol, Fluticasone Furoate, losartan, montelukast, raloxifene, ranitidine, simvastatin, Alum Hydroxide-Mag Carbonate, and PULMICORT FLEXHALER.  Meds ordered this encounter  Medications  . levocetirizine (XYZAL) 5 MG tablet    Sig: Take 1 tablet (5 mg total) by mouth every evening.    Dispense:  90 tablet    Refill:  3  . triamcinolone (NASACORT ALLERGY 24HR) 55 MCG/ACT AERO nasal inhaler    Sig: Place 4 sprays into the nose daily.    Dispense:  16.5 mL    Refill:  11  . levothyroxine (SYNTHROID, LEVOTHROID) 125 MCG tablet    Sig: Take 1 tablet (125 mcg total) by mouth daily.    Dispense:  90 tablet    Refill:  1     Follow-up: Return in about 6 months (around 09/13/2017).  Scarlette Calico, MD

## 2017-05-07 ENCOUNTER — Telehealth: Payer: Self-pay

## 2017-05-07 NOTE — Telephone Encounter (Signed)
PA started.   Key: [id number]

## 2017-05-14 ENCOUNTER — Ambulatory Visit: Payer: Self-pay | Admitting: Allergy and Immunology

## 2017-05-14 NOTE — Telephone Encounter (Signed)
Pt called checking on the status of this. She said that she wanted to let you know that she has tried arnuity and flovent and they did not help. I told her that the PA was started and we are waiting for the insurance company to respond back.

## 2017-05-15 NOTE — Telephone Encounter (Signed)
Pt called back. Please call back.  She does have [employer]

## 2017-05-15 NOTE — Telephone Encounter (Signed)
LVM for pt to call back as soon as possible.   PA came back that coverage could not be verified.

## 2017-05-16 NOTE — Telephone Encounter (Signed)
[id number] rx bin [id number] rx pcn COS  ID: [id number]   PA restarted: Key: [id number]

## 2017-05-20 NOTE — Telephone Encounter (Signed)
Will you call pt and let her know the PA was approved.

## 2017-05-21 NOTE — Telephone Encounter (Signed)
Patient is aware. Thank you.

## 2017-06-17 ENCOUNTER — Ambulatory Visit (INDEPENDENT_AMBULATORY_CARE_PROVIDER_SITE_OTHER): Payer: Medicare Other | Admitting: Allergy and Immunology

## 2017-06-17 ENCOUNTER — Encounter: Payer: Self-pay | Admitting: Allergy and Immunology

## 2017-06-17 VITALS — BP 122/72 | HR 100 | Temp 97.7°F | Resp 16 | Ht 64.5 in | Wt 171.0 lb

## 2017-06-17 DIAGNOSIS — H1045 Other chronic allergic conjunctivitis: Secondary | ICD-10-CM

## 2017-06-17 DIAGNOSIS — L718 Other rosacea: Secondary | ICD-10-CM | POA: Diagnosis not present

## 2017-06-17 DIAGNOSIS — J4530 Mild persistent asthma, uncomplicated: Secondary | ICD-10-CM

## 2017-06-17 DIAGNOSIS — H1010 Acute atopic conjunctivitis, unspecified eye: Secondary | ICD-10-CM

## 2017-06-17 DIAGNOSIS — J3089 Other allergic rhinitis: Secondary | ICD-10-CM | POA: Diagnosis not present

## 2017-06-17 MED ORDER — LOTEPREDNOL ETABONATE 0.2 % OP SUSP: 1.0000 [drp] | Freq: Every day | OPHTHALMIC | 5 refills | Status: DC

## 2017-06-17 MED ORDER — DOXYCYCLINE HYCLATE 100 MG PO CAPS: 100.0000 mg | ORAL_CAPSULE | Freq: Every day | ORAL | 5 refills | Status: DC

## 2017-06-17 MED ORDER — METRONIDAZOLE 0.75 % EX CREA: TOPICAL_CREAM | Freq: Two times a day (BID) | CUTANEOUS | 0 refills | Status: DC

## 2017-06-17 NOTE — Patient Instructions (Addendum)
  1. Continue to treat inflammation:   A. Pulmicort 180 - 2 inhalations twice a day  B. montelukast 10 mg tablet 1 time per day  2. Treat rosacea:   A. Metro cream applied to face twice a day  B. doxycycline 100 mg 1 time per day (photosensitivity?)  C. Alrex one drop each eye one time per day  3. If needed:   A. Xyzal 5mg  one tablet one time per day  B. Proventil HFA or similar 2 inhalations every 4-6 hours  4. Return to clinic in 4 weeks or earlier if problem

## 2017-06-17 NOTE — Progress Notes (Signed)
Dear Dr. Ronnald Ramp,  Thank you for referring Natalie Shannon to the [facility] on 06/17/2017.   Below is a summation of this patient's evaluation and recommendations.  Thank you for your referral. I will keep you informed about this patient's response to treatment.   If you have any questions please do not hesitate to contact me.   Sincerely,  Jiles Prows, MD Allergy / Immunology [place]   ______________________________________________________________________    NEW PATIENT NOTE  Referring Provider: Janith Lima, MD Primary Provider: Janith Lima, MD Date of office visit: 06/17/2017    Subjective:   Chief Complaint:  Natalie Shannon (DOB: [birth year]) is a 76 y.o. female who presents to the clinic on 06/17/2017 with a chief complaint of Allergic Rhinitis  .     HPI: Natalie Shannon presents to this clinic in evaluation of problems with allergies. She is a long-term patient of Dr. Keturah Barre and between Dr. Annamaria Boots and other doctors she has had over 40 years of immunotherapy administered which she discontinued this March of this year.  Ann's biggest complaint is her eyes. She has itchy eyes and burning eyes and red eyes and she scratches her eyes and rubs her eyes all the time. It does not matter if she receives immunotherapy or not or uses any other type of treatment, nothing seems to help her eyes.  She does have a history of asthma that appears to be under very good control with her combination of Pulmicort and montelukast. While utilizing these medications she rarely uses a short acting bronchodilator. Likewise, her upper airway disease appears to be under very good control while using Xyzal. She will occasionally develops spells of sneezing in the fall usually following exposure to dust but otherwise does very well with the use of an antihistamine.  Past Medical History:  Diagnosis  Date  . Asthma   . GERD (gastroesophageal reflux disease)   . Hyperlipidemia   . Hypertension   . Pneumothorax, traumatic 1993   rib fracture x4 (thrown from horse)  . Urticaria    ? solar induced    Past Surgical History:  Procedure Laterality Date  . COLONOSCOPY     Neg X3, benign polyps ;due 2019, Dr Sharlett Iles  . DILATION AND CURETTAGE OF UTERUS  02/2007    Dr Radene Knee  . TONSILLECTOMY      Allergies as of 06/17/2017      Reactions   Latex    rash   Povidone-iodine    edema   Sulfonamide Derivatives    Mental status changes, altered vision      Medication List      albuterol 108 (90 Base) MCG/ACT inhaler Commonly known as:  PROVENTIL HFA;VENTOLIN HFA Inhale 2 puffs into the lungs every 6 (six) hours as needed for wheezing or shortness of breath.   Alum Hydroxide-Mag Carbonate 160-105 MG Chew Commonly known as:  GAVISCON EXTRA STRENGTH Chew 1 tablet by mouth 3 (three) times daily with meals as needed.   aspirin 81 MG tablet Take 81 mg by mouth daily.   augmented betamethasone dipropionate 0.05 % ointment Commonly known as:  DIPROLENE-AF   desonide 0.05 % ointment Commonly known as:  DESOWEN Apply 1 application topically daily as needed.   EPINEPHrine 0.3 mg/0.3 mL Soaj injection Commonly known as:  EPI-PEN Inject into thigh for severe allergic reaction   levocetirizine 5 MG tablet Commonly  known as:  XYZAL Take 1 tablet (5 mg total) by mouth every evening.   levothyroxine 125 MCG tablet Commonly known as:  SYNTHROID, LEVOTHROID Take 1 tablet (125 mcg total) by mouth daily.   losartan 100 MG tablet Commonly known as:  COZAAR Take 1 tablet (100 mg total) by mouth daily.   montelukast 10 MG tablet Commonly known as:  SINGULAIR Take 1 tablet (10 mg total) by mouth at bedtime.   NONFORMULARY OR COMPOUNDED ITEM Allergy Vaccine 1:10 Given at [place] 180 MCG/ACT inhaler Generic drug:  budesonide inhale 2 puffs by mouth twice a day  and then Rinse mouth after use   raloxifene 60 MG tablet Commonly known as:  EVISTA Take 1 tablet (60 mg total) by mouth daily.   ranitidine 300 MG tablet Commonly known as:  ZANTAC take 1 tablet by mouth at bedtime   simvastatin 40 MG tablet Commonly known as:  ZOCOR Take 1 tablet (40 mg total) by mouth daily.   triamcinolone 55 MCG/ACT Aero nasal inhaler Commonly known as:  NASACORT ALLERGY 24HR Place 4 sprays into the nose daily.   TUBERCULIN SYR 1CC/25GX5/8" 25G X 5/8" 1 ML Misc Commonly known as:  B-D TB SYRINGE 1CC/25GX5/8" Use as directed for allergy injections       Review of systems negative except as noted in HPI / PMHx or noted below:  Review of Systems  Constitutional: Negative.   HENT: Negative.   Eyes: Negative.   Respiratory: Negative.   Cardiovascular: Negative.   Gastrointestinal: Negative.   Genitourinary: Negative.   Musculoskeletal: Negative.   Skin:       Bit by her horse on her abdomen with resultant mass 8 years ago  Neurological: Negative.   Endo/Heme/Allergies: Negative.   Psychiatric/Behavioral: Negative.     Family History  Problem Relation Age of Onset  . Asthma Father   . Diabetes Mother   . Stroke Mother 71       post carotid endarterectomy  . Hypertension Mother   . Atrial fibrillation Brother   . Kidney disease Brother        idiopathic  . Stroke Maternal Grandfather        in 21s  . Heart attack Maternal Uncle        after 55  . Breast cancer Maternal Aunt     Social History   Social History  . Marital status: Divorced    Spouse name: N/A  . Number of children: N/A  . Years of education: N/A   Occupational History  . Not on file.   Social History Main Topics  . Smoking status: Former Smoker    Quit date: 12/31/1967  . Smokeless tobacco: Former Systems developer     Comment: smoked [phone], up 1/2 ppd  . Alcohol use No  . Drug use: No  . Sexual activity: Not Currently   Other Topics Concern  . Not on file   Social  History Narrative  . No narrative on file    Environmental and Social history  Lives in a apartment with a dry environment, a dog located inside the household, carpeting in the bedroom, plastic on the bed, plastic on the pillow, no smokers located inside the household.  Objective:   Vitals:   06/17/17 1439  BP: 122/72  Pulse: 100  Resp: 16  Temp: 97.7 F (36.5 C)   Height: 5' 4.5" (163.8 cm) Weight: 171 lb (77.6 kg)  Physical Exam  Constitutional: She is well-developed,  well-nourished, and in no distress.  HENT:  Head: Normocephalic. Head is without right periorbital erythema and without left periorbital erythema.  Right Ear: Tympanic membrane, external ear and ear canal normal.  Left Ear: Tympanic membrane, external ear and ear canal normal.  Nose: Nose normal. No mucosal edema or rhinorrhea.  Mouth/Throat: Uvula is midline, oropharynx is clear and moist and mucous membranes are normal. No oropharyngeal exudate.  Eyes: Lids are normal. Pupils are equal, round, and reactive to light. Right conjunctiva is injected. Left conjunctiva is injected.  Neck: Trachea normal. No tracheal tenderness present. No tracheal deviation present. No thyromegaly present.  Cardiovascular: Normal rate, regular rhythm, S1 normal, S2 normal and normal heart sounds.   No murmur heard. Pulmonary/Chest: Effort normal and breath sounds normal. No stridor. No tachypnea. No respiratory distress. She has no wheezes. She has no rales. She exhibits no tenderness.  Abdominal: Soft. She exhibits mass (approximately 10 cm diameter subcutaneous mobile mass). She exhibits no distension. There is no hepatosplenomegaly. There is no tenderness. There is no rebound and no guarding.  Musculoskeletal: She exhibits no edema or tenderness.  Lymphadenopathy:       Head (right side): No tonsillar adenopathy present.       Head (left side): No tonsillar adenopathy present.    She has no cervical adenopathy.    She has no  axillary adenopathy.  Neurological: She is alert. Gait normal.  Skin: Rash (diffuse erythema face) noted. She is not diaphoretic. No erythema. No pallor. Nails show no clubbing.  Psychiatric: Mood and affect normal.    Diagnostics: Allergy skin tests were not performed.   Spirometry was performed and demonstrated an FEV1 of 1.90 @ 87 % of predicted.  Assessment and Plan:    1. Asthma, well controlled, mild persistent   2. Other allergic rhinitis   3. Seasonal allergic conjunctivitis   4. Ocular rosacea     1. Continue to treat inflammation:   A. Pulmicort 180 - 2 inhalations twice a day  B. montelukast 10 mg tablet 1 time per day  2. Treat rosacea:   A. Metro cream applied to face twice a day  B. doxycycline 100 mg 1 time per day (photosensitivity?)  C. Alrex one drop each eye one time per day  3. If needed:   A. Xyzal 5mg  one tablet one time per day  B. Proventil HFA or similar 2 inhalations every 4-6 hours  4. Return to clinic in 4 weeks or earlier if problem  I suspect that Chantele has ocular rosacea contributing to her unrelenting and long-standing eye issues that have not responded to good therapy directed against atopic disease. I will start her on doxycycline and MetroCream and also have her use a low dose of low potency topical steroid with Alrex. She will continue to utilize anti-inflammatory agents for her respiratory tract to address her asthma and allergic rhinitis. I will regroup with her in 4 weeks or earlier if there is a problem.  Jiles Prows, MD [facility]

## 2017-06-19 ENCOUNTER — Telehealth: Payer: Self-pay | Admitting: *Deleted

## 2017-06-19 NOTE — Telephone Encounter (Signed)
06/18/17 @ 4:45pm late entry spoke with Roselyn Reef at pharmacy advised per Dr Neldon Mc there is no generic for Alrex eye drops. Per Dr Neldon Mc ok to keep the same and the bottle will last 4 months

## 2017-06-30 ENCOUNTER — Telehealth: Payer: Self-pay | Admitting: Allergy and Immunology

## 2017-06-30 MED ORDER — AZITHROMYCIN 250 MG PO TABS: ORAL_TABLET | ORAL | 0 refills | Status: DC

## 2017-06-30 NOTE — Telephone Encounter (Signed)
Replace with azithromycin 250 mg 1 time per day. What type of reaction to cheese have the doxycycline?

## 2017-06-30 NOTE — Telephone Encounter (Signed)
Patient had a reaction to doxycycline and has now stopped the medication.  Patient was to be taking the medication for 4 wks and has now stopped it at two weeks The other medications are fine

## 2017-06-30 NOTE — Telephone Encounter (Signed)
FYI

## 2017-06-30 NOTE — Telephone Encounter (Signed)
Called patient. I informed her that we are switching her to Azithromycin 250 mg 1 time per day for the next 14 days. The reaction the patient had with Doxycycline is of the following:Stomach Cramps, light head, nausea, and diarrhea.

## 2017-07-15 LAB — HM MAMMOGRAPHY

## 2017-07-18 ENCOUNTER — Encounter: Payer: Self-pay | Admitting: Internal Medicine

## 2017-07-23 ENCOUNTER — Encounter: Payer: Self-pay | Admitting: Allergy and Immunology

## 2017-07-23 ENCOUNTER — Ambulatory Visit (INDEPENDENT_AMBULATORY_CARE_PROVIDER_SITE_OTHER): Payer: Medicare Other | Admitting: Allergy and Immunology

## 2017-07-23 VITALS — BP 132/80 | HR 92 | Temp 97.6°F | Resp 18

## 2017-07-23 DIAGNOSIS — J4530 Mild persistent asthma, uncomplicated: Secondary | ICD-10-CM | POA: Diagnosis not present

## 2017-07-23 DIAGNOSIS — L718 Other rosacea: Secondary | ICD-10-CM | POA: Diagnosis not present

## 2017-07-23 DIAGNOSIS — H1045 Other chronic allergic conjunctivitis: Secondary | ICD-10-CM

## 2017-07-23 DIAGNOSIS — J3089 Other allergic rhinitis: Secondary | ICD-10-CM

## 2017-07-23 DIAGNOSIS — J301 Allergic rhinitis due to pollen: Secondary | ICD-10-CM

## 2017-07-23 DIAGNOSIS — R197 Diarrhea, unspecified: Secondary | ICD-10-CM | POA: Diagnosis not present

## 2017-07-23 DIAGNOSIS — H1010 Acute atopic conjunctivitis, unspecified eye: Secondary | ICD-10-CM

## 2017-07-23 MED ORDER — LEVOCETIRIZINE DIHYDROCHLORIDE 5 MG PO TABS: 5.0000 mg | ORAL_TABLET | Freq: Every evening | ORAL | 1 refills | Status: DC

## 2017-07-23 NOTE — Patient Instructions (Addendum)
  1. Continue to treat inflammation:   A. Pulmicort 180 - 2 inhalations 1-2 times a day depending on asthma activity  B. montelukast 10 mg tablet 1 time per day  2. Treat rosacea:   A. Metro cream applied to face twice a day  B. Alrex one drop each eye 1-7 times a week  3. If needed:   A. Xyzal 5mg  one tablet one time per day  B. Proventil HFA or similar 2 inhalations every 4-6 hours  4. Obtain stool sample for C. diff toxin and culture  5. Return to clinic in 12 weeks or earlier if problem  6. Obtain fall flu vaccine

## 2017-07-23 NOTE — Progress Notes (Signed)
Follow-up Note  Referring Provider: Janith Lima, MD Primary Provider: Janith Lima, MD Date of Office Visit: 07/23/2017  Subjective:   Natalie Shannon (DOB: [birth year]) is a 76 y.o. female who returns to the [facility] on 07/23/2017 in re-evaluation of the following:  HPI: [name] returns to this clinic in reevaluation of her asthma and allergic rhinoconjunctivitis and suspected ocular rosacea. I last saw her in this clinic during her initial evaluation of 06/17/2017.  She was unable to tolerate the doxycycline for her ocular rosacea and developed very significant GI upset including diarrhea and when attempting to switch to azithromycin developed very similar types of symptoms. However, she no longer has any explosive diarrhea although she still has a stool every 2 hours during the daytime but none during the nighttime. She stopped her azithromycin one week ago yet still continues to have this issue.  The skin on her face is doing better and her eyes are doing very well. She is using Alrex about one or 2 times per week at this point and continues to use MetroCream.  She has had no issues with her asthma and has not had to use a short acting bronchodilator. She has had very little issues with her nose at this point in time.  Allergies as of 07/23/2017      Reactions   Latex    rash   Povidone-iodine    edema   Sulfonamide Derivatives    Mental status changes, altered vision      Medication List      albuterol 108 (90 Base) MCG/ACT inhaler Commonly known as:  PROVENTIL HFA;VENTOLIN HFA Inhale 2 puffs into the lungs every 6 (six) hours as needed for wheezing or shortness of breath.   Alum Hydroxide-Mag Carbonate 160-105 MG Chew Commonly known as:  GAVISCON EXTRA STRENGTH Chew 1 tablet by mouth 3 (three) times daily with meals as needed.   aspirin 81 MG tablet Take 81 mg by mouth daily.   augmented betamethasone dipropionate 0.05 % ointment Commonly  known as:  DIPROLENE-AF   desonide 0.05 % ointment Commonly known as:  DESOWEN Apply 1 application topically daily as needed.   EPINEPHrine 0.3 mg/0.3 mL Soaj injection Commonly known as:  EPI-PEN Inject into thigh for severe allergic reaction   levocetirizine 5 MG tablet Commonly known as:  XYZAL Take 1 tablet (5 mg total) by mouth every evening.   levothyroxine 125 MCG tablet Commonly known as:  SYNTHROID, LEVOTHROID Take 1 tablet (125 mcg total) by mouth daily.   losartan 100 MG tablet Commonly known as:  COZAAR Take 1 tablet (100 mg total) by mouth daily.   loteprednol 0.2 % Susp Commonly known as:  LOTEMAX Place 1 drop into both eyes daily.   metroNIDAZOLE 0.75 % cream Commonly known as:  METROCREAM Apply topically 2 (two) times daily.   montelukast 10 MG tablet Commonly known as:  SINGULAIR Take 1 tablet (10 mg total) by mouth at bedtime.   NONFORMULARY OR COMPOUNDED ITEM Allergy Vaccine 1:10 Given at [place] 180 MCG/ACT inhaler Generic drug:  budesonide inhale 2 puffs by mouth twice a day and then Rinse mouth after use   ranitidine 300 MG tablet Commonly known as:  ZANTAC take 1 tablet by mouth at bedtime   simvastatin 40 MG tablet Commonly known as:  ZOCOR Take 1 tablet (40 mg total) by mouth daily.   triamcinolone 55 MCG/ACT Aero nasal inhaler Commonly known as:  NASACORT ALLERGY 24HR Place 4 sprays into the nose daily.   TUBERCULIN SYR 1CC/25GX5/8" 25G X 5/8" 1 ML Misc Commonly known as:  B-D TB SYRINGE 1CC/25GX5/8" Use as directed for allergy injections       Past Medical History:  Diagnosis Date  . Asthma   . GERD (gastroesophageal reflux disease)   . Hyperlipidemia   . Hypertension   . Pneumothorax, traumatic 1993   rib fracture x4 (thrown from horse)  . Urticaria    ? solar induced    Past Surgical History:  Procedure Laterality Date  . COLONOSCOPY     Neg X3, benign polyps ;due 2019, Dr Sharlett Iles  . DILATION  AND CURETTAGE OF UTERUS  02/2007    Dr Radene Knee  . TONSILLECTOMY      Review of systems negative except as noted in HPI / PMHx or noted below:  Review of Systems  Constitutional: Negative.   HENT: Negative.   Eyes: Negative.   Respiratory: Negative.   Cardiovascular: Negative.   Gastrointestinal: Negative.   Genitourinary: Negative.   Musculoskeletal: Negative.   Skin: Negative.   Neurological: Negative.   Endo/Heme/Allergies: Negative.   Psychiatric/Behavioral: Negative.      Objective:   Vitals:   07/23/17 1039  BP: 132/80  Pulse: 92  Resp: 18  Temp: 97.6 F (36.4 C)          Physical Exam  Constitutional: She is well-developed, well-nourished, and in no distress.  HENT:  Head: Normocephalic.  Right Ear: Tympanic membrane, external ear and ear canal normal.  Left Ear: Tympanic membrane, external ear and ear canal normal.  Nose: Nose normal. No mucosal edema or rhinorrhea.  Mouth/Throat: Uvula is midline, oropharynx is clear and moist and mucous membranes are normal. No oropharyngeal exudate.  Eyes: Conjunctivae are normal.  Neck: Trachea normal. No tracheal tenderness present. No tracheal deviation present. No thyromegaly present.  Cardiovascular: Normal rate, regular rhythm, S1 normal, S2 normal and normal heart sounds.   No murmur heard. Pulmonary/Chest: Breath sounds normal. No stridor. No respiratory distress. She has no wheezes. She has no rales.  Musculoskeletal: She exhibits no edema.  Lymphadenopathy:       Head (right side): No tonsillar adenopathy present.       Head (left side): No tonsillar adenopathy present.    She has no cervical adenopathy.  Neurological: She is alert. Gait normal.  Skin: Rash (diminished erythema and induration of facial skin) noted. She is not diaphoretic. No erythema. Nails show no clubbing.  Psychiatric: Mood and affect normal.    Diagnostics:    Spirometry was performed and demonstrated an FEV1 of 2.05 at 95 % of  predicted.  Assessment and Plan:   1. Asthma, well controlled, mild persistent   2. Other allergic rhinitis   3. Seasonal allergic conjunctivitis   4. Ocular rosacea   5. Diarrhea of presumed infectious origin   6. Acute nonseasonal allergic rhinitis due to pollen     1. Continue to treat inflammation:   A. Pulmicort 180 - 2 inhalations 1-2 times a day depending on asthma activity  B. montelukast 10 mg tablet 1 time per day  2. Treat rosacea:   A. Metro cream applied to face twice a day  B. Alrex one drop each eye 1-7 times a week  3. If needed:   A. Xyzal 5mg  one tablet one time per day  B. Proventil HFA or similar 2 inhalations every 4-6 hours  4. Obtain stool sample for C. diff  toxin and culture  5. Return to clinic in 12 weeks or earlier if problem  6. Obtain fall flu vaccine  It does appear as though Sinclaire is doing much better regarding her rosacea with possible ocular component while using MetroCream and using Alrex occasionally. However, we appear to have precipitated a new problem with the development of diarrhea after using her doxycycline and azithromycin. I think we are obligated to rule out the possibility of Clostridium difficile infection. She will continue on anti-inflammatory agents for her respiratory tract as noted above. If she does well I will see her back in this clinic in 12 weeks or earlier if there is a problem.  Allena Katz, MD Allergy / Immunology [place]

## 2017-08-05 ENCOUNTER — Telehealth: Payer: Self-pay | Admitting: Internal Medicine

## 2017-08-05 NOTE — Telephone Encounter (Signed)
Okay to switch?  

## 2017-08-05 NOTE — Telephone Encounter (Signed)
Notified pharmacy.

## 2017-08-05 NOTE — Telephone Encounter (Signed)
Pharmacy states they have had a change in manufacture of levothyroxine.  Needs an approval for an OK to switch manufacture.

## 2017-09-15 ENCOUNTER — Encounter: Payer: Self-pay | Admitting: Internal Medicine

## 2017-09-15 ENCOUNTER — Ambulatory Visit (INDEPENDENT_AMBULATORY_CARE_PROVIDER_SITE_OTHER): Payer: Medicare Other | Admitting: Internal Medicine

## 2017-09-15 ENCOUNTER — Other Ambulatory Visit (INDEPENDENT_AMBULATORY_CARE_PROVIDER_SITE_OTHER): Payer: Medicare Other

## 2017-09-15 VITALS — BP 120/78 | HR 80 | Temp 97.7°F | Resp 16 | Ht 64.5 in | Wt 168.0 lb

## 2017-09-15 DIAGNOSIS — E781 Pure hyperglyceridemia: Secondary | ICD-10-CM

## 2017-09-15 DIAGNOSIS — Z23 Encounter for immunization: Secondary | ICD-10-CM

## 2017-09-15 DIAGNOSIS — I10 Essential (primary) hypertension: Secondary | ICD-10-CM

## 2017-09-15 DIAGNOSIS — E785 Hyperlipidemia, unspecified: Secondary | ICD-10-CM

## 2017-09-15 DIAGNOSIS — E038 Other specified hypothyroidism: Secondary | ICD-10-CM

## 2017-09-15 DIAGNOSIS — R7303 Prediabetes: Secondary | ICD-10-CM

## 2017-09-15 LAB — COMPREHENSIVE METABOLIC PANEL
ALBUMIN: 4.3 g/dL (ref 3.5–5.2)
ALK PHOS: 85 U/L (ref 39–117)
ALT: 42 U/L — AB (ref 0–35)
AST: 27 U/L (ref 0–37)
BILIRUBIN TOTAL: 0.4 mg/dL (ref 0.2–1.2)
BUN: 21 mg/dL (ref 6–23)
CALCIUM: 10.1 mg/dL (ref 8.4–10.5)
CO2: 26 meq/L (ref 19–32)
CREATININE: 1.01 mg/dL (ref 0.40–1.20)
Chloride: 105 mEq/L (ref 96–112)
GFR: 56.97 mL/min — AB (ref >60.00)
Glucose, Bld: 106 mg/dL — ABNORMAL HIGH (ref 70–99)
Potassium: 4.2 mEq/L (ref 3.5–5.1)
Sodium: 140 mEq/L (ref 135–145)
Total Protein: 7 g/dL (ref 6.0–8.3)

## 2017-09-15 LAB — LDL CHOLESTEROL, DIRECT: LDL DIRECT: 91 mg/dL

## 2017-09-15 LAB — HEMOGLOBIN A1C: HEMOGLOBIN A1C: 6.2 % (ref 4.6–6.5)

## 2017-09-15 LAB — LIPID PANEL
CHOLESTEROL: 175 mg/dL (ref 0–200)
HDL: 60.8 mg/dL (ref >39.00)
NonHDL: 113.74
TRIGLYCERIDES: 253 mg/dL — AB (ref 0.0–149.0)
Total CHOL/HDL Ratio: 3
VLDL: 50.6 mg/dL — AB (ref 0.0–40.0)

## 2017-09-15 LAB — CBC WITH DIFFERENTIAL/PLATELET
BASOS ABS: 0.1 10*3/uL (ref 0.0–0.1)
Basophils Relative: 1.1 % (ref 0.0–3.0)
EOS ABS: 0.4 10*3/uL (ref 0.0–0.7)
EOS PCT: 6.5 % — AB (ref 0.0–5.0)
HCT: 43.8 % (ref 36.0–46.0)
HEMOGLOBIN: 14.6 g/dL (ref 12.0–15.0)
LYMPHS ABS: 1.4 10*3/uL (ref 0.7–4.0)
Lymphocytes Relative: 21.7 % (ref 12.0–46.0)
MCHC: 33.5 g/dL (ref 30.0–36.0)
MCV: 86.8 fl (ref 78.0–100.0)
Monocytes Absolute: 0.5 10*3/uL (ref 0.1–1.0)
Monocytes Relative: 7.4 % (ref 3.0–12.0)
NEUTROS PCT: 63.3 % (ref 43.0–77.0)
Neutro Abs: 4.2 10*3/uL (ref 1.4–7.7)
Platelets: 383 10*3/uL (ref 150.0–400.0)
RBC: 5.04 Mil/uL (ref 3.87–5.11)
RDW: 14.1 % (ref 11.5–15.5)
WBC: 6.6 10*3/uL (ref 4.0–10.5)

## 2017-09-15 LAB — TSH: TSH: 0.05 u[IU]/mL — AB (ref 0.35–4.50)

## 2017-09-15 MED ORDER — LEVOTHYROXINE SODIUM 112 MCG PO TABS: 112.0000 ug | ORAL_TABLET | Freq: Every day | ORAL | 0 refills | Status: DC

## 2017-09-15 NOTE — Progress Notes (Signed)
Subjective:  Patient ID: Natalie Shannon, female    DOB: 05-12-1941  Age: 76 y.o. MRN: [id number]  CC: Hypertension and Hyperlipidemia   HPI Natalie Shannon presents for f/up - she complains of chronic fatigue for many, many years but feels well otherwise.  Outpatient Medications Prior to Visit  Medication Sig Dispense Refill  . albuterol (PROVENTIL HFA;VENTOLIN HFA) 108 (90 Base) MCG/ACT inhaler Inhale 2 puffs into the lungs every 6 (six) hours as needed for wheezing or shortness of breath. 1 Inhaler prn  . Alum Hydroxide-Mag Carbonate (GAVISCON EXTRA STRENGTH) 160-105 MG CHEW Chew 1 tablet by mouth 3 (three) times daily with meals as needed. 90 tablet 11  . aspirin 81 MG tablet Take 81 mg by mouth daily.    Marland Kitchen augmented betamethasone dipropionate (DIPROLENE-AF) 0.05 % ointment   0  . desonide (DESOWEN) 0.05 % ointment Apply 1 application topically daily as needed.    Marland Kitchen EPINEPHrine 0.3 mg/0.3 mL IJ SOAJ injection Inject into thigh for severe allergic reaction 1 Device 12  . levocetirizine (XYZAL) 5 MG tablet Take 1 tablet (5 mg total) by mouth every evening. 90 tablet 1  . losartan (COZAAR) 100 MG tablet Take 1 tablet (100 mg total) by mouth daily. 90 tablet 3  . loteprednol (LOTEMAX) 0.2 % SUSP Place 1 drop into both eyes daily. 10 mL 5  . metroNIDAZOLE (METROCREAM) 0.75 % cream Apply topically 2 (two) times daily. 45 g 0  . montelukast (SINGULAIR) 10 MG tablet Take 1 tablet (10 mg total) by mouth at bedtime. 90 tablet 3  . NONFORMULARY OR COMPOUNDED ITEM Allergy Vaccine 1:10 Given at Home    . PULMICORT FLEXHALER 180 MCG/ACT inhaler inhale 2 puffs by mouth twice a day and then Rinse mouth after use 3 Inhaler 3  . ranitidine (ZANTAC) 300 MG tablet take 1 tablet by mouth at bedtime 90 tablet 3  . simvastatin (ZOCOR) 40 MG tablet Take 1 tablet (40 mg total) by mouth daily. 90 tablet 3  . triamcinolone (NASACORT ALLERGY 24HR) 55 MCG/ACT AERO nasal inhaler Place 4 sprays into the nose  daily. 16.5 mL 11  . TUBERCULIN SYR 1CC/25GX5/8" (B-D TB SYRINGE 1CC/25GX5/8") 25G X 5/8" 1 ML MISC Use as directed for allergy injections 100 each 0  . levothyroxine (SYNTHROID, LEVOTHROID) 125 MCG tablet Take 1 tablet (125 mcg total) by mouth daily. 90 tablet 1   No facility-administered medications prior to visit.     ROS Review of Systems  Constitutional: Positive for fatigue. Negative for appetite change, chills, diaphoresis and unexpected weight change.  HENT: Negative.  Negative for trouble swallowing.   Eyes: Negative.  Negative for visual disturbance.  Respiratory: Negative.  Negative for cough, chest tightness, shortness of breath and wheezing.   Cardiovascular: Negative for chest pain, palpitations and leg swelling.  Gastrointestinal: Negative for abdominal pain, constipation, diarrhea, nausea and vomiting.  Endocrine: Negative for cold intolerance and heat intolerance.  Genitourinary: Negative.  Negative for decreased urine volume and difficulty urinating.  Musculoskeletal: Negative.  Negative for arthralgias, back pain, myalgias and neck pain.  Skin: Negative.   Allergic/Immunologic: Negative.   Neurological: Negative.  Negative for dizziness, weakness and numbness.  Hematological: Negative for adenopathy. Does not bruise/bleed easily.  Psychiatric/Behavioral: Negative.     Objective:  BP 120/78 (BP Location: Left Arm, Patient Position: Sitting, Cuff Size: Normal)   Pulse 80   Temp 97.7 F (36.5 C) (Oral)   Resp 16   Ht 5' 4.5" (1.638 m)  Wt 168 lb (76.2 kg)   SpO2 98%   BMI 28.39 kg/m   BP Readings from Last 3 Encounters:  09/15/17 120/78  07/23/17 132/80  06/17/17 122/72    Wt Readings from Last 3 Encounters:  09/15/17 168 lb (76.2 kg)  06/17/17 171 lb (77.6 kg)  03/13/17 170 lb (77.1 kg)    Physical Exam  Constitutional: She is oriented to person, place, and time. No distress.  HENT:  Mouth/Throat: Oropharynx is clear and moist. No oropharyngeal  exudate.  Eyes: Conjunctivae are normal. Right eye exhibits no discharge. Left eye exhibits no discharge. No scleral icterus.  Neck: Neck supple. No JVD present. No thyromegaly present.  Cardiovascular: Normal rate, regular rhythm and intact distal pulses.  Exam reveals no gallop and no friction rub.   No murmur heard. Pulmonary/Chest: Effort normal and breath sounds normal. No respiratory distress. She has no wheezes. She has no rales. She exhibits no tenderness.  Abdominal: Soft. Bowel sounds are normal. She exhibits no distension and no mass. There is no tenderness. There is no rebound and no guarding.  Musculoskeletal: Normal range of motion. She exhibits no edema, tenderness or deformity.  Neurological: She is alert and oriented to person, place, and time.  Skin: Skin is warm and dry. No rash noted. She is not diaphoretic. No erythema. No pallor.  Vitals reviewed.   Lab Results  Component Value Date   WBC 6.6 09/15/2017   HGB 14.6 09/15/2017   HCT 43.8 09/15/2017   PLT 383.0 09/15/2017   GLUCOSE 106 (H) 09/15/2017   CHOL 175 09/15/2017   TRIG 253.0 (H) 09/15/2017   HDL 60.80 09/15/2017   LDLDIRECT 91.0 09/15/2017   LDLCALC 53 05/17/2014   ALT 42 (H) 09/15/2017   AST 27 09/15/2017   NA 140 09/15/2017   K 4.2 09/15/2017   CL 105 09/15/2017   CREATININE 1.01 09/15/2017   BUN 21 09/15/2017   CO2 26 09/15/2017   TSH 0.05 (L) 09/15/2017   INR 1.01 05/15/2012   HGBA1C 6.2 09/15/2017    Dg Chest 2 View  Result Date: 03/12/2013 *RADIOLOGY REPORT* Clinical Data: Physical exam.  Asthma, prior smoker. CHEST - 2 VIEW Comparison: 05/15/2012 Findings: Heart is normal size.  Lungs are clear.  No effusions. Old healed bilateral rib fractures.  No visible acute fracture. IMPRESSION: No acute cardiopulmonary disease. Original Report Authenticated By: Rolm Baptise, M.D.    Assessment & Plan:   Natalie Shannon was seen today for hypertension and hyperlipidemia.  Diagnoses and all orders for this  visit:  Essential hypertension- her BP is well controlled, lytes and renal fxn are stable -     Comprehensive metabolic panel; Future -     CBC with Differential/Platelet; Future  Other specified hypothyroidism- her TSH is suppressed, I have lowered her T4 dose -     TSH; Future -     CBC with Differential/Platelet; Future -     levothyroxine (SYNTHROID, LEVOTHROID) 112 MCG tablet; Take 1 tablet (112 mcg total) by mouth daily before breakfast.  Prediabetes- her A1C is at 6.2%, medical therapy is not indicated, she will work on her lifestyle modifications -     Hemoglobin A1c; Future  Pure hyperglyceridemia- her trigs are mildly elevated, do not need to be treated -     Lipid panel; Future  Hyperlipidemia LDL goal <130- she has achieved her LDL goal and is doing well on the statin -     Lipid panel; Future  Need for influenza vaccination -  Flu vaccine HIGH DOSE PF (Fluzone High dose)   I have discontinued Natalie Shannon's levothyroxine. I am also having her start on levothyroxine. Additionally, I am having her maintain her aspirin, TUBERCULIN SYR 1CC/25GX5/8", NONFORMULARY OR COMPOUNDED ITEM, EPINEPHrine, augmented betamethasone dipropionate, albuterol, losartan, montelukast, ranitidine, simvastatin, Alum Hydroxide-Mag Carbonate, PULMICORT FLEXHALER, triamcinolone, desonide, metroNIDAZOLE, loteprednol, and levocetirizine.  Meds ordered this encounter  Medications  . levothyroxine (SYNTHROID, LEVOTHROID) 112 MCG tablet    Sig: Take 1 tablet (112 mcg total) by mouth daily before breakfast.    Dispense:  90 tablet    Refill:  0     Follow-up: Return in about 6 months (around 03/15/2018).  Scarlette Calico, MD

## 2017-09-15 NOTE — Patient Instructions (Signed)
Hypothyroidism Hypothyroidism is a disorder of the thyroid. The thyroid is a large gland that is located in the lower front of the neck. The thyroid releases hormones that control how the body works. With hypothyroidism, the thyroid does not make enough of these hormones. What are the causes? Causes of hypothyroidism may include:  Viral infections.  Pregnancy.  Your own defense system (immune system) attacking your thyroid.  Certain medicines.  Birth defects.  Past radiation treatments to your head or neck.  Past treatment with radioactive iodine.  Past surgical removal of part or all of your thyroid.  Problems with the gland that is located in the center of your brain (pituitary).  What are the signs or symptoms? Signs and symptoms of hypothyroidism may include:  Feeling as though you have no energy (lethargy).  Inability to tolerate cold.  Weight gain that is not explained by a change in diet or exercise habits.  Dry skin.  Coarse hair.  Menstrual irregularity.  Slowing of thought processes.  Constipation.  Sadness or depression.  How is this diagnosed? Your health care provider may diagnose hypothyroidism with blood tests and ultrasound tests. How is this treated? Hypothyroidism is treated with medicine that replaces the hormones that your body does not make. After you begin treatment, it may take several weeks for symptoms to go away. Follow these instructions at home:  Take medicines only as directed by your health care provider.  If you start taking any new medicines, tell your health care provider.  Keep all follow-up visits as directed by your health care provider. This is important. As your condition improves, your dosage needs may change. You will need to have blood tests regularly so that your health care provider can watch your condition. Contact a health care provider if:  Your symptoms do not get better with treatment.  You are taking thyroid  replacement medicine and: ? You sweat excessively. ? You have tremors. ? You feel anxious. ? You lose weight rapidly. ? You cannot tolerate heat. ? You have emotional swings. ? You have diarrhea. ? You feel weak. Get help right away if:  You develop chest pain.  You develop an irregular heartbeat.  You develop a rapid heartbeat. This information is not intended to replace advice given to you by your health care provider. Make sure you discuss any questions you have with your health care provider. Document Released: 12/16/2005 Document Revised: 05/23/2016 Document Reviewed: 05/03/2014 Elsevier Interactive Patient Education  2017 [employer]  

## 2017-11-04 ENCOUNTER — Encounter: Payer: Medicare Other | Admitting: Internal Medicine

## 2017-11-05 ENCOUNTER — Other Ambulatory Visit: Payer: Self-pay | Admitting: Internal Medicine

## 2017-11-05 DIAGNOSIS — I10 Essential (primary) hypertension: Secondary | ICD-10-CM

## 2017-11-05 MED ORDER — LOSARTAN POTASSIUM 100 MG PO TABS: 100.0000 mg | ORAL_TABLET | Freq: Every day | ORAL | 0 refills | Status: DC

## 2017-11-10 ENCOUNTER — Telehealth: Payer: Self-pay

## 2017-11-10 MED ORDER — RANITIDINE HCL 300 MG PO TABS: ORAL_TABLET | ORAL | 1 refills | Status: DC

## 2017-11-10 NOTE — Telephone Encounter (Signed)
[facility] sent rx req for ranitidine 300 mg tablet. erx sent for 90 and 1

## 2017-12-03 ENCOUNTER — Telehealth: Payer: Self-pay

## 2017-12-03 MED ORDER — SIMVASTATIN 40 MG PO TABS: 40.0000 mg | ORAL_TABLET | Freq: Every day | ORAL | 3 refills | Status: DC

## 2017-12-03 MED ORDER — MONTELUKAST SODIUM 10 MG PO TABS: 10.0000 mg | ORAL_TABLET | Freq: Every day | ORAL | 3 refills | Status: DC

## 2017-12-03 NOTE — Telephone Encounter (Signed)
[facility] sent rx rf rq for simvastatin. LOV 08/2017. erx sent as rq.

## 2017-12-04 ENCOUNTER — Other Ambulatory Visit (INDEPENDENT_AMBULATORY_CARE_PROVIDER_SITE_OTHER): Payer: Medicare Other

## 2017-12-04 ENCOUNTER — Ambulatory Visit (INDEPENDENT_AMBULATORY_CARE_PROVIDER_SITE_OTHER): Payer: Medicare Other | Admitting: Internal Medicine

## 2017-12-04 ENCOUNTER — Encounter: Payer: Self-pay | Admitting: Internal Medicine

## 2017-12-04 VITALS — BP 124/72 | HR 86 | Temp 97.7°F | Resp 16 | Ht 65.0 in | Wt 168.0 lb

## 2017-12-04 DIAGNOSIS — E039 Hypothyroidism, unspecified: Secondary | ICD-10-CM

## 2017-12-04 DIAGNOSIS — I10 Essential (primary) hypertension: Secondary | ICD-10-CM

## 2017-12-04 LAB — TSH: TSH: 0.22 u[IU]/mL — ABNORMAL LOW (ref 0.35–4.50)

## 2017-12-04 LAB — COMPREHENSIVE METABOLIC PANEL
ALT: 36 U/L — AB (ref 0–35)
AST: 27 U/L (ref 0–37)
Albumin: 4.4 g/dL (ref 3.5–5.2)
Alkaline Phosphatase: 83 U/L (ref 39–117)
BUN: 23 mg/dL (ref 6–23)
CHLORIDE: 105 meq/L (ref 96–112)
CO2: 29 meq/L (ref 19–32)
Calcium: 9.9 mg/dL (ref 8.4–10.5)
Creatinine, Ser: 0.97 mg/dL (ref 0.40–1.20)
GFR: 59.66 mL/min — ABNORMAL LOW (ref >60.00)
Glucose, Bld: 103 mg/dL — ABNORMAL HIGH (ref 70–99)
Potassium: 4.1 mEq/L (ref 3.5–5.1)
SODIUM: 141 meq/L (ref 135–145)
Total Bilirubin: 0.5 mg/dL (ref 0.2–1.2)
Total Protein: 6.9 g/dL (ref 6.0–8.3)

## 2017-12-04 MED ORDER — LEVOTHYROXINE SODIUM 100 MCG PO TABS: 100.0000 ug | ORAL_TABLET | Freq: Every day | ORAL | 0 refills | Status: DC

## 2017-12-04 NOTE — Progress Notes (Signed)
Subjective:  Patient ID: Natalie Shannon, female    DOB: 28-Sep-1941  Age: 76 y.o. MRN: [id number]  CC: Hypertension and Hypothyroidism   HPI Natalie Shannon presents for f/up - She complains of fatigue.  She denies CP, DOE, palpitations, edema, dizziness, or lightheadedness.  Outpatient Medications Prior to Visit  Medication Sig Dispense Refill  . albuterol (PROVENTIL HFA;VENTOLIN HFA) 108 (90 Base) MCG/ACT inhaler Inhale 2 puffs into the lungs every 6 (six) hours as needed for wheezing or shortness of breath. 1 Inhaler prn  . Alum Hydroxide-Mag Carbonate (GAVISCON EXTRA STRENGTH) 160-105 MG CHEW Chew 1 tablet by mouth 3 (three) times daily with meals as needed. 90 tablet 11  . aspirin 81 MG tablet Take 81 mg by mouth daily.    Marland Kitchen desonide (DESOWEN) 0.05 % ointment Apply 1 application topically daily as needed.    Marland Kitchen levocetirizine (XYZAL) 5 MG tablet Take 1 tablet (5 mg total) by mouth every evening. 90 tablet 1  . losartan (COZAAR) 100 MG tablet Take 1 tablet (100 mg total) daily by mouth. 90 tablet 0  . loteprednol (LOTEMAX) 0.2 % SUSP Place 1 drop into both eyes daily. 10 mL 5  . metroNIDAZOLE (METROCREAM) 0.75 % cream Apply topically 2 (two) times daily. 45 g 0  . montelukast (SINGULAIR) 10 MG tablet Take 1 tablet (10 mg total) by mouth at bedtime. 90 tablet 3  . PULMICORT FLEXHALER 180 MCG/ACT inhaler inhale 2 puffs by mouth twice a day and then Rinse mouth after use 3 Inhaler 3  . ranitidine (ZANTAC) 300 MG tablet take 1 tablet by mouth at bedtime 90 tablet 1  . simvastatin (ZOCOR) 40 MG tablet Take 1 tablet (40 mg total) by mouth daily. 90 tablet 3  . triamcinolone (NASACORT ALLERGY 24HR) 55 MCG/ACT AERO nasal inhaler Place 4 sprays into the nose daily. 16.5 mL 11  . EPINEPHrine 0.3 mg/0.3 mL IJ SOAJ injection Inject into thigh for severe allergic reaction 1 Device 12  . levothyroxine (SYNTHROID, LEVOTHROID) 112 MCG tablet Take 1 tablet (112 mcg total) by mouth daily before  breakfast. 90 tablet 0  . NONFORMULARY OR COMPOUNDED ITEM Allergy Vaccine 1:10 Given at Home    . TUBERCULIN SYR 1CC/25GX5/8" (B-D TB SYRINGE 1CC/25GX5/8") 25G X 5/8" 1 ML MISC Use as directed for allergy injections 100 each 0  . augmented betamethasone dipropionate (DIPROLENE-AF) 0.05 % ointment   0   No facility-administered medications prior to visit.     ROS Review of Systems  Constitutional: Positive for fatigue. Negative for appetite change, chills, diaphoresis and fever.  HENT: Negative.   Eyes: Negative.  Negative for visual disturbance.  Respiratory: Negative for cough, chest tightness, shortness of breath and wheezing.   Cardiovascular: Negative for chest pain, palpitations and leg swelling.  Gastrointestinal: Negative for abdominal pain, constipation, diarrhea, nausea and vomiting.  Endocrine: Negative.  Negative for cold intolerance and heat intolerance.  Genitourinary: Negative.  Negative for difficulty urinating.  Musculoskeletal: Negative.  Negative for back pain, myalgias and neck pain.  Skin: Negative for color change and rash.  Allergic/Immunologic: Negative.   Neurological: Negative.  Negative for dizziness, weakness and light-headedness.  Hematological: Negative for adenopathy. Does not bruise/bleed easily.  Psychiatric/Behavioral: Negative.  Negative for dysphoric mood and sleep disturbance. The patient is not nervous/anxious.     Objective:  BP 124/72   Pulse 86   Temp 97.7 F (36.5 C)   Resp 16   Ht 5\' 5"  (1.651 m)   Wt  168 lb (76.2 kg)   SpO2 96%   BMI 27.96 kg/m   BP Readings from Last 3 Encounters:  12/04/17 124/72  09/15/17 120/78  07/23/17 132/80    Wt Readings from Last 3 Encounters:  12/04/17 168 lb (76.2 kg)  09/15/17 168 lb (76.2 kg)  06/17/17 171 lb (77.6 kg)    Physical Exam  Constitutional: She is oriented to person, place, and time. No distress.  HENT:  Mouth/Throat: Oropharynx is clear and moist. No oropharyngeal exudate.    Eyes: Conjunctivae are normal. Left eye exhibits no discharge. No scleral icterus.  Neck: Normal range of motion. Neck supple. No JVD present. No thyromegaly present.  Cardiovascular: Normal rate, regular rhythm and normal heart sounds. Exam reveals no gallop.  No murmur heard. Pulmonary/Chest: Effort normal and breath sounds normal. She has no wheezes. She has no rales.  Abdominal: Soft. Bowel sounds are normal. There is no tenderness. There is no rebound and no guarding.  Musculoskeletal: Normal range of motion. She exhibits no edema, tenderness or deformity.  Lymphadenopathy:    She has no cervical adenopathy.  Neurological: She is alert and oriented to person, place, and time.  Skin: Skin is warm and dry. She is not diaphoretic. No erythema. No pallor.  Vitals reviewed.   Lab Results  Component Value Date   WBC 6.6 09/15/2017   HGB 14.6 09/15/2017   HCT 43.8 09/15/2017   PLT 383.0 09/15/2017   GLUCOSE 103 (H) 12/04/2017   CHOL 175 09/15/2017   TRIG 253.0 (H) 09/15/2017   HDL 60.80 09/15/2017   LDLDIRECT 91.0 09/15/2017   LDLCALC 53 05/17/2014   ALT 36 (H) 12/04/2017   AST 27 12/04/2017   NA 141 12/04/2017   K 4.1 12/04/2017   CL 105 12/04/2017   CREATININE 0.97 12/04/2017   BUN 23 12/04/2017   CO2 29 12/04/2017   TSH 0.22 (L) 12/04/2017   INR 1.01 05/15/2012   HGBA1C 6.2 09/15/2017    Dg Chest 2 View  Result Date: 03/12/2013 *RADIOLOGY REPORT* Clinical Data: Physical exam.  Asthma, prior smoker. CHEST - 2 VIEW Comparison: 05/15/2012 Findings: Heart is normal size.  Lungs are clear.  No effusions. Old healed bilateral rib fractures.  No visible acute fracture. IMPRESSION: No acute cardiopulmonary disease. Original Report Authenticated By: Rolm Baptise, M.D.    Assessment & Plan:   Natalie Shannon was seen today for hypertension and hypothyroidism.  Diagnoses and all orders for this visit:  Essential hypertension- Her blood pressure is well controlled.  Electrolytes and  renal function are normal. -     Comprehensive metabolic panel; Future  Acquired hypothyroidism- Her TSH is suppressed so I decreased her levothyroxine dose again.  Thia may be causing her fatigue.  Her renal function and electrolytes are normal.  She was asked to return if her fatigue persists and I will look at other potential causes. -     Cancel: TSH; Future -     TSH; Future -     levothyroxine (SYNTHROID, LEVOTHROID) 100 MCG tablet; Take 1 tablet (100 mcg total) by mouth daily.   I have discontinued Natalie Shannon's TUBERCULIN SYR 1CC/25GX5/8", NONFORMULARY OR COMPOUNDED ITEM, EPINEPHrine, augmented betamethasone dipropionate, and levothyroxine. I am also having her start on levothyroxine. Additionally, I am having her maintain her aspirin, albuterol, Alum Hydroxide-Mag Carbonate, PULMICORT FLEXHALER, triamcinolone, desonide, metroNIDAZOLE, loteprednol, levocetirizine, losartan, ranitidine, simvastatin, and montelukast.  Meds ordered this encounter  Medications  . levothyroxine (SYNTHROID, LEVOTHROID) 100 MCG tablet  Sig: Take 1 tablet (100 mcg total) by mouth daily.    Dispense:  90 tablet    Refill:  0     Follow-up: Return in about 6 months (around 06/04/2018).  Scarlette Calico, MD

## 2017-12-04 NOTE — Patient Instructions (Signed)
Hypothyroidism Hypothyroidism is a disorder of the thyroid. The thyroid is a large gland that is located in the lower front of the neck. The thyroid releases hormones that control how the body works. With hypothyroidism, the thyroid does not make enough of these hormones. What are the causes? Causes of hypothyroidism may include:  Viral infections.  Pregnancy.  Your own defense system (immune system) attacking your thyroid.  Certain medicines.  Birth defects.  Past radiation treatments to your head or neck.  Past treatment with radioactive iodine.  Past surgical removal of part or all of your thyroid.  Problems with the gland that is located in the center of your brain (pituitary).  What are the signs or symptoms? Signs and symptoms of hypothyroidism may include:  Feeling as though you have no energy (lethargy).  Inability to tolerate cold.  Weight gain that is not explained by a change in diet or exercise habits.  Dry skin.  Coarse hair.  Menstrual irregularity.  Slowing of thought processes.  Constipation.  Sadness or depression.  How is this diagnosed? Your health care provider may diagnose hypothyroidism with blood tests and ultrasound tests. How is this treated? Hypothyroidism is treated with medicine that replaces the hormones that your body does not make. After you begin treatment, it may take several weeks for symptoms to go away. Follow these instructions at home:  Take medicines only as directed by your health care provider.  If you start taking any new medicines, tell your health care provider.  Keep all follow-up visits as directed by your health care provider. This is important. As your condition improves, your dosage needs may change. You will need to have blood tests regularly so that your health care provider can watch your condition. Contact a health care provider if:  Your symptoms do not get better with treatment.  You are taking thyroid  replacement medicine and: ? You sweat excessively. ? You have tremors. ? You feel anxious. ? You lose weight rapidly. ? You cannot tolerate heat. ? You have emotional swings. ? You have diarrhea. ? You feel weak. Get help right away if:  You develop chest pain.  You develop an irregular heartbeat.  You develop a rapid heartbeat. This information is not intended to replace advice given to you by your health care provider. Make sure you discuss any questions you have with your health care provider. Document Released: 12/16/2005 Document Revised: 05/23/2016 Document Reviewed: 05/03/2014 Elsevier Interactive Patient Education  2017 [employer]  

## 2017-12-04 NOTE — Progress Notes (Deleted)
Subjective:   Natalie Shannon is a 76 y.o. female who presents for Medicare Annual (Subsequent) preventive examination.  Review of Systems:  No ROS.  Medicare Wellness Visit. Additional risk factors are reflected in the social history.    Sleep patterns: {SX; SLEEP PATTERNS:18802::"feels rested on waking","does not get up to void","gets up *** times nightly to void","sleeps *** hours nightly"}.   Home Safety/Smoke Alarms: Feels safe in home. Smoke alarms in place.  Living environment; residence and Firearm Safety: {Rehab home environment / accessibility:30080::"no firearms","firearms stored safely"}. Seat Belt Safety/Bike Helmet: Wears seat belt.     Objective:     Vitals: There were no vitals taken for this visit.  There is no height or weight on file to calculate BMI.  Advanced Directives 11/01/2016 11/01/2015 10/31/2015 05/11/2015 05/16/2012  Does Patient Have a Medical Advance Directive? Yes Yes Yes Yes Patient has advance directive, copy not in chart  Type of Advance Directive [place];Living will [place];Living will [place];Living will [place];Living will -  Does patient want to make changes to medical advance directive? No - Patient declined No - Patient declined No - Patient declined - -  Copy of [place] in Chart? Yes Yes Yes - -    Tobacco Social History   Tobacco Use  Smoking Status Former Smoker  . Last attempt to quit: 12/31/1967  . Years since quitting: 49.9  Smokeless Tobacco Former User  Tobacco Comment   smoked [phone], up 1/2 ppd     Counseling given: Not Answered Comment: smoked 1965-1969, up 1/2 ppd     Past Medical History:  Diagnosis Date  . Asthma   . GERD (gastroesophageal reflux disease)   . Hyperlipidemia   . Hypertension   . Pneumothorax, traumatic 1993   rib fracture x4 (thrown from horse)  . Urticaria    ? solar induced   Past  Surgical History:  Procedure Laterality Date  . COLONOSCOPY     Neg X3, benign polyps ;due 2019, Dr Sharlett Iles  . DILATION AND CURETTAGE OF UTERUS  02/2007    Dr Radene Knee  . TONSILLECTOMY     Family History  Problem Relation Age of Onset  . Asthma Father   . Diabetes Mother   . Stroke Mother 50       post carotid endarterectomy  . Hypertension Mother   . Atrial fibrillation Brother   . Kidney disease Brother        idiopathic  . Stroke Maternal Grandfather        in 71s  . Heart attack Maternal Uncle        after 55  . Breast cancer Maternal Aunt    Social History   Socioeconomic History  . Marital status: Divorced    Spouse name: Not on file  . Number of children: Not on file  . Years of education: Not on file  . Highest education level: Not on file  Social Needs  . Financial resource strain: Not on file  . Food insecurity - worry: Not on file  . Food insecurity - inability: Not on file  . Transportation needs - medical: Not on file  . Transportation needs - non-medical: Not on file  Occupational History  . Not on file  Tobacco Use  . Smoking status: Former Smoker    Last attempt to quit: 12/31/1967    Years since quitting: 49.9  . Smokeless tobacco: Former Systems developer  .  Tobacco comment: smoked 1965-1969, up 1/2 ppd  Substance and Sexual Activity  . Alcohol use: No  . Drug use: No  . Sexual activity: Not Currently  Other Topics Concern  . Not on file  Social History Narrative  . Not on file    Outpatient Encounter Medications as of 12/04/2017  Medication Sig  . albuterol (PROVENTIL HFA;VENTOLIN HFA) 108 (90 Base) MCG/ACT inhaler Inhale 2 puffs into the lungs every 6 (six) hours as needed for wheezing or shortness of breath.  . Alum Hydroxide-Mag Carbonate (GAVISCON EXTRA STRENGTH) 160-105 MG CHEW Chew 1 tablet by mouth 3 (three) times daily with meals as needed.  Marland Kitchen aspirin 81 MG tablet Take 81 mg by mouth daily.  Marland Kitchen augmented betamethasone dipropionate (DIPROLENE-AF)  0.05 % ointment   . desonide (DESOWEN) 0.05 % ointment Apply 1 application topically daily as needed.  Marland Kitchen EPINEPHrine 0.3 mg/0.3 mL IJ SOAJ injection Inject into thigh for severe allergic reaction  . levocetirizine (XYZAL) 5 MG tablet Take 1 tablet (5 mg total) by mouth every evening.  Marland Kitchen levothyroxine (SYNTHROID, LEVOTHROID) 112 MCG tablet Take 1 tablet (112 mcg total) by mouth daily before breakfast.  . losartan (COZAAR) 100 MG tablet Take 1 tablet (100 mg total) daily by mouth.  . loteprednol (LOTEMAX) 0.2 % SUSP Place 1 drop into both eyes daily.  . metroNIDAZOLE (METROCREAM) 0.75 % cream Apply topically 2 (two) times daily.  . montelukast (SINGULAIR) 10 MG tablet Take 1 tablet (10 mg total) by mouth at bedtime.  . NONFORMULARY OR COMPOUNDED ITEM Allergy Vaccine 1:10 Given at Home  . PULMICORT FLEXHALER 180 MCG/ACT inhaler inhale 2 puffs by mouth twice a day and then Rinse mouth after use  . ranitidine (ZANTAC) 300 MG tablet take 1 tablet by mouth at bedtime  . simvastatin (ZOCOR) 40 MG tablet Take 1 tablet (40 mg total) by mouth daily.  Marland Kitchen triamcinolone (NASACORT ALLERGY 24HR) 55 MCG/ACT AERO nasal inhaler Place 4 sprays into the nose daily.  . TUBERCULIN SYR 1CC/25GX5/8" (B-D TB SYRINGE 1CC/25GX5/8") 25G X 5/8" 1 ML MISC Use as directed for allergy injections  . [DISCONTINUED] montelukast (SINGULAIR) 10 MG tablet Take 1 tablet (10 mg total) by mouth at bedtime.  . [DISCONTINUED] simvastatin (ZOCOR) 40 MG tablet Take 1 tablet (40 mg total) by mouth daily.   No facility-administered encounter medications on file as of 12/04/2017.     Activities of Daily Living No flowsheet data found.  Patient Care Team: Janith Lima, MD as PCP - General (Internal Medicine)    Assessment:    Physical assessment deferred to PCP.  Exercise Activities and Dietary recommendations   Diet (meal preparation, eat out, water intake, caffeinated beverages, dairy products, fruits and vegetables): {Desc;  diets:16563}   Goals    None     Fall Risk Fall Risk  11/01/2016 10/31/2015 10/30/2015 10/25/2014 10/21/2013  Falls in the past year? No No No No No   Depression Screen PHQ 2/9 Scores 11/01/2016 11/01/2015 10/31/2015 10/30/2015  PHQ - 2 Score 0 0 0 0     Cognitive Function        Immunization History  Administered Date(s) Administered  . H1N1 01/12/2009  . Influenza Split 10/15/2011, 09/29/2012, 10/07/2014, 09/30/2015  . Influenza Whole 10/24/2009, 09/21/2010  . Influenza, High Dose Seasonal PF 09/15/2017  . Influenza,inj,Quad PF,6+ Mos 10/21/2013  . Influenza-Unspecified 09/29/2016  . Pneumococcal Conjugate-13 10/30/2016  . Pneumococcal Polysaccharide-23 12/30/2000, 09/28/2008, 03/13/2017  . Td 05/12/1994, 12/30/2002  . Tdap 08/10/2012  .  Zoster 06/24/2008   Screening Tests Health Maintenance  Topic Date Due  . COLONOSCOPY  04/27/2013  . MAMMOGRAM  07/16/2019  . TETANUS/TDAP  08/10/2022  . INFLUENZA VACCINE  Completed  . DEXA SCAN  Completed  . PNA vac Low Risk Adult  Completed       Plan:     I have personally reviewed and noted the following in the patient's chart:   . Medical and social history . Use of alcohol, tobacco or illicit drugs  . Current medications and supplements . Functional ability and status . Nutritional status . Physical activity . Advanced directives . List of other physicians . Vitals . Screenings to include cognitive, depression, and falls . Referrals and appointments  In addition, I have reviewed and discussed with patient certain preventive protocols, quality metrics, and best practice recommendations. A written personalized care plan for preventive services as well as general preventive health recommendations were provided to patient.     Michiel Cowboy, RN  12/04/2017

## 2018-01-02 ENCOUNTER — Telehealth: Payer: Self-pay

## 2018-01-02 NOTE — Telephone Encounter (Signed)
Key: [id number]

## 2018-01-05 NOTE — Telephone Encounter (Signed)
PA was approved. Can you inform pt of same?  

## 2018-01-06 NOTE — Telephone Encounter (Signed)
Pt informed and expressed understanding. 

## 2018-03-02 ENCOUNTER — Other Ambulatory Visit: Payer: Self-pay | Admitting: Internal Medicine

## 2018-03-02 DIAGNOSIS — I10 Essential (primary) hypertension: Secondary | ICD-10-CM

## 2018-03-02 MED ORDER — LOSARTAN POTASSIUM 100 MG PO TABS: 100.0000 mg | ORAL_TABLET | Freq: Every day | ORAL | 1 refills | Status: DC

## 2018-03-02 MED ORDER — BUDESONIDE 180 MCG/ACT IN AEPB: 2.0000 | INHALATION_SPRAY | Freq: Two times a day (BID) | RESPIRATORY_TRACT | 3 refills | Status: DC

## 2018-03-02 NOTE — Addendum Note (Signed)
Addended by: Aviva Signs M on: 03/02/2018 04:45 PM   Modules accepted: Orders

## 2018-03-11 ENCOUNTER — Encounter: Payer: Self-pay | Admitting: Internal Medicine

## 2018-03-11 ENCOUNTER — Other Ambulatory Visit (INDEPENDENT_AMBULATORY_CARE_PROVIDER_SITE_OTHER): Payer: Medicare Other

## 2018-03-11 ENCOUNTER — Ambulatory Visit: Payer: Medicare Other | Admitting: Internal Medicine

## 2018-03-11 VITALS — BP 120/80 | HR 77 | Resp 16 | Ht 65.0 in | Wt 164.2 lb

## 2018-03-11 DIAGNOSIS — R945 Abnormal results of liver function studies: Secondary | ICD-10-CM

## 2018-03-11 DIAGNOSIS — R7989 Other specified abnormal findings of blood chemistry: Secondary | ICD-10-CM | POA: Insufficient documentation

## 2018-03-11 DIAGNOSIS — Z1211 Encounter for screening for malignant neoplasm of colon: Secondary | ICD-10-CM

## 2018-03-11 DIAGNOSIS — E039 Hypothyroidism, unspecified: Secondary | ICD-10-CM

## 2018-03-11 DIAGNOSIS — I10 Essential (primary) hypertension: Secondary | ICD-10-CM

## 2018-03-11 DIAGNOSIS — E2839 Other primary ovarian failure: Secondary | ICD-10-CM

## 2018-03-11 LAB — COMPREHENSIVE METABOLIC PANEL
ALT: 38 U/L — AB (ref 0–35)
AST: 25 U/L (ref 0–37)
Albumin: 4.3 g/dL (ref 3.5–5.2)
Alkaline Phosphatase: 86 U/L (ref 39–117)
BILIRUBIN TOTAL: 0.4 mg/dL (ref 0.2–1.2)
BUN: 23 mg/dL (ref 6–23)
CALCIUM: 10.2 mg/dL (ref 8.4–10.5)
CO2: 29 meq/L (ref 19–32)
CREATININE: 0.98 mg/dL (ref 0.40–1.20)
Chloride: 102 mEq/L (ref 96–112)
GFR: 58.91 mL/min — ABNORMAL LOW (ref >60.00)
Glucose, Bld: 109 mg/dL — ABNORMAL HIGH (ref 70–99)
Potassium: 4.5 mEq/L (ref 3.5–5.1)
Sodium: 139 mEq/L (ref 135–145)
Total Protein: 7.1 g/dL (ref 6.0–8.3)

## 2018-03-11 LAB — CBC WITH DIFFERENTIAL/PLATELET
BASOS ABS: 0.1 10*3/uL (ref 0.0–0.1)
Basophils Relative: 1.1 % (ref 0.0–3.0)
Eosinophils Absolute: 0.4 10*3/uL (ref 0.0–0.7)
Eosinophils Relative: 5.8 % — ABNORMAL HIGH (ref 0.0–5.0)
HCT: 46.9 % — ABNORMAL HIGH (ref 36.0–46.0)
Hemoglobin: 15.6 g/dL — ABNORMAL HIGH (ref 12.0–15.0)
LYMPHS ABS: 1.1 10*3/uL (ref 0.7–4.0)
Lymphocytes Relative: 16.4 % (ref 12.0–46.0)
MCHC: 33.2 g/dL (ref 30.0–36.0)
MCV: 86.7 fl (ref 78.0–100.0)
MONO ABS: 0.4 10*3/uL (ref 0.1–1.0)
Monocytes Relative: 6.3 % (ref 3.0–12.0)
NEUTROS ABS: 4.6 10*3/uL (ref 1.4–7.7)
NEUTROS PCT: 70.4 % (ref 43.0–77.0)
PLATELETS: 364 10*3/uL (ref 150.0–400.0)
RBC: 5.41 Mil/uL — ABNORMAL HIGH (ref 3.87–5.11)
RDW: 14.7 % (ref 11.5–15.5)
WBC: 6.5 10*3/uL (ref 4.0–10.5)

## 2018-03-11 LAB — TSH: TSH: 4.31 u[IU]/mL (ref 0.35–4.50)

## 2018-03-11 NOTE — Patient Instructions (Signed)
Hypothyroidism Hypothyroidism is a disorder of the thyroid. The thyroid is a large gland that is located in the lower front of the neck. The thyroid releases hormones that control how the body works. With hypothyroidism, the thyroid does not make enough of these hormones. What are the causes? Causes of hypothyroidism may include:  Viral infections.  Pregnancy.  Your own defense system (immune system) attacking your thyroid.  Certain medicines.  Birth defects.  Past radiation treatments to your head or neck.  Past treatment with radioactive iodine.  Past surgical removal of part or all of your thyroid.  Problems with the gland that is located in the center of your brain (pituitary).  What are the signs or symptoms? Signs and symptoms of hypothyroidism may include:  Feeling as though you have no energy (lethargy).  Inability to tolerate cold.  Weight gain that is not explained by a change in diet or exercise habits.  Dry skin.  Coarse hair.  Menstrual irregularity.  Slowing of thought processes.  Constipation.  Sadness or depression.  How is this diagnosed? Your health care provider may diagnose hypothyroidism with blood tests and ultrasound tests. How is this treated? Hypothyroidism is treated with medicine that replaces the hormones that your body does not make. After you begin treatment, it may take several weeks for symptoms to go away. Follow these instructions at home:  Take medicines only as directed by your health care provider.  If you start taking any new medicines, tell your health care provider.  Keep all follow-up visits as directed by your health care provider. This is important. As your condition improves, your dosage needs may change. You will need to have blood tests regularly so that your health care provider can watch your condition. Contact a health care provider if:  Your symptoms do not get better with treatment.  You are taking thyroid  replacement medicine and: ? You sweat excessively. ? You have tremors. ? You feel anxious. ? You lose weight rapidly. ? You cannot tolerate heat. ? You have emotional swings. ? You have diarrhea. ? You feel weak. Get help right away if:  You develop chest pain.  You develop an irregular heartbeat.  You develop a rapid heartbeat. This information is not intended to replace advice given to you by your health care provider. Make sure you discuss any questions you have with your health care provider. Document Released: 12/16/2005 Document Revised: 05/23/2016 Document Reviewed: 05/03/2014 Elsevier Interactive Patient Education  2018 Elsevier Inc.  

## 2018-03-11 NOTE — Progress Notes (Signed)
Subjective:  Patient ID: Natalie Shannon, female    DOB: [birth year]  Age: 77 y.o. MRN: [id number]  CC: Hypertension; Hypothyroidism; and Hyperlipidemia   HPI Natalie Shannon presents for f/up - She feels well today and offers no complaints.  She has intentionally lost 4 pounds since I last saw her.  She denies abdominal pain, nausea, vomiting, or loss of appetite.  She has a good level of endurance and a good energy level.    Outpatient Medications Prior to Visit  Medication Sig Dispense Refill  . albuterol (PROVENTIL HFA;VENTOLIN HFA) 108 (90 Base) MCG/ACT inhaler Inhale 2 puffs into the lungs every 6 (six) hours as needed for wheezing or shortness of breath. 1 Inhaler prn  . Alum Hydroxide-Mag Carbonate (GAVISCON EXTRA STRENGTH) 160-105 MG CHEW Chew 1 tablet by mouth 3 (three) times daily with meals as needed. 90 tablet 11  . aspirin 81 MG tablet Take 81 mg by mouth daily.    . budesonide (PULMICORT FLEXHALER) 180 MCG/ACT inhaler Inhale 2 puffs into the lungs 2 (two) times daily. 3 Inhaler 3  . desonide (DESOWEN) 0.05 % ointment Apply 1 application topically daily as needed.    Marland Kitchen levocetirizine (XYZAL) 5 MG tablet Take 1 tablet (5 mg total) by mouth every evening. 90 tablet 1  . levothyroxine (SYNTHROID, LEVOTHROID) 100 MCG tablet Take 1 tablet (100 mcg total) by mouth daily. 90 tablet 0  . losartan (COZAAR) 100 MG tablet Take 1 tablet (100 mg total) by mouth daily. 90 tablet 1  . loteprednol (LOTEMAX) 0.2 % SUSP Place 1 drop into both eyes daily. 10 mL 5  . metroNIDAZOLE (METROCREAM) 0.75 % cream Apply topically 2 (two) times daily. 45 g 0  . montelukast (SINGULAIR) 10 MG tablet Take 1 tablet (10 mg total) by mouth at bedtime. 90 tablet 3  . ranitidine (ZANTAC) 300 MG tablet take 1 tablet by mouth at bedtime 90 tablet 1  . triamcinolone (NASACORT ALLERGY 24HR) 55 MCG/ACT AERO nasal inhaler Place 4 sprays into the nose daily. 16.5 mL 11  . simvastatin (ZOCOR) 40 MG tablet Take 1 tablet  (40 mg total) by mouth daily. 90 tablet 3   No facility-administered medications prior to visit.     ROS Review of Systems  Constitutional: Negative for appetite change, chills, diaphoresis, fatigue and unexpected weight change.  HENT: Negative.  Negative for trouble swallowing.   Eyes: Negative for visual disturbance.  Respiratory: Negative for cough, chest tightness, shortness of breath and wheezing.   Cardiovascular: Negative for chest pain, palpitations and leg swelling.  Gastrointestinal: Negative for abdominal pain, constipation, diarrhea, nausea and vomiting.  Endocrine: Negative.  Negative for cold intolerance and heat intolerance.  Genitourinary: Negative.  Negative for difficulty urinating and dysuria.  Musculoskeletal: Negative.  Negative for arthralgias and myalgias.  Skin: Negative.  Negative for color change and pallor.  Neurological: Negative.  Negative for dizziness, weakness and light-headedness.  Hematological: Negative for adenopathy. Does not bruise/bleed easily.  Psychiatric/Behavioral: Negative.     Objective:  BP 120/80 (BP Location: Left Arm, Patient Position: Sitting, Cuff Size: Normal)   Pulse 77   Resp 16   Ht 5\' 5"  (1.651 m)   Wt 164 lb 4 oz (74.5 kg)   SpO2 97%   BMI 27.33 kg/m   BP Readings from Last 3 Encounters:  03/11/18 120/80  12/04/17 124/72  09/15/17 120/78    Wt Readings from Last 3 Encounters:  03/11/18 164 lb 4 oz (74.5 kg)  12/04/17  168 lb (76.2 kg)  09/15/17 168 lb (76.2 kg)    Physical Exam  Constitutional: She is oriented to person, place, and time. No distress.  HENT:  Mouth/Throat: Oropharynx is clear and moist. No oropharyngeal exudate.  Eyes: Conjunctivae are normal. Left eye exhibits no discharge. No scleral icterus.  Neck: Normal range of motion. Neck supple. No JVD present. No thyromegaly present.  Cardiovascular: Normal rate, regular rhythm and normal heart sounds. Exam reveals no gallop.  No murmur  heard. Pulmonary/Chest: Effort normal and breath sounds normal. No respiratory distress. She has no wheezes. She has no rales.  Abdominal: Soft. Bowel sounds are normal. She exhibits no distension and no mass. There is no tenderness. There is no rebound and no guarding.  Musculoskeletal: Normal range of motion. She exhibits no edema, tenderness or deformity.  Lymphadenopathy:    She has no cervical adenopathy.  Neurological: She is alert and oriented to person, place, and time.  Skin: Skin is warm and dry. No rash noted. She is not diaphoretic. No erythema. No pallor.  Vitals reviewed.   Lab Results  Component Value Date   WBC 6.5 03/11/2018   HGB 15.6 (H) 03/11/2018   HCT 46.9 (H) 03/11/2018   PLT 364.0 03/11/2018   GLUCOSE 109 (H) 03/11/2018   CHOL 175 09/15/2017   TRIG 253.0 (H) 09/15/2017   HDL 60.80 09/15/2017   LDLDIRECT 91.0 09/15/2017   LDLCALC 53 05/17/2014   ALT 38 (H) 03/11/2018   AST 25 03/11/2018   NA 139 03/11/2018   K 4.5 03/11/2018   CL 102 03/11/2018   CREATININE 0.98 03/11/2018   BUN 23 03/11/2018   CO2 29 03/11/2018   TSH 4.31 03/11/2018   INR 1.01 05/15/2012   HGBA1C 6.2 09/15/2017    Dg Chest 2 View  Result Date: 03/12/2013 *RADIOLOGY REPORT* Clinical Data: Physical exam.  Asthma, prior smoker. CHEST - 2 VIEW Comparison: 05/15/2012 Findings: Heart is normal size.  Lungs are clear.  No effusions. Old healed bilateral rib fractures.  No visible acute fracture. IMPRESSION: No acute cardiopulmonary disease. Original Report Authenticated By: Rolm Baptise, M.D.    Assessment & Plan:   Natalie Shannon was seen today for hypertension, hypothyroidism and hyperlipidemia.  Diagnoses and all orders for this visit:  Acquired hypothyroidism- Her TSH is in the acceptable range.  She will remain on the current dose of levothyroxine. -     TSH; Future  Essential hypertension- Her blood pressure is adequately well controlled.  Electrolytes and renal function are normal. -      CBC with Differential/Platelet; Future -     Comprehensive metabolic panel; Future  Elevated LFTs- She has a persistent, mild elevation in her ALT.  Screening for hepatitis C is negative.  This is most consistent with fatty liver disease. -     Comprehensive metabolic panel; Future -     Hepatitis C antibody; Future  Estrogen deficiency -     DG Bone Density; Future  Colon cancer screening -     Cologuard   I have discontinued Lorelle A. Gunnarson's simvastatin. I am also having her maintain her aspirin, albuterol, Alum Hydroxide-Mag Carbonate, triamcinolone, desonide, metroNIDAZOLE, loteprednol, levocetirizine, ranitidine, montelukast, levothyroxine, losartan, and budesonide.  No orders of the defined types were placed in this encounter.    Follow-up: Return in about 6 months (around 09/11/2018).  Scarlette Calico, MD

## 2018-03-12 ENCOUNTER — Encounter: Payer: Self-pay | Admitting: Internal Medicine

## 2018-03-12 LAB — HEPATITIS C ANTIBODY
Hepatitis C Ab: NONREACTIVE
SIGNAL TO CUT-OFF: 0.01 (ref <1.00)

## 2018-03-18 ENCOUNTER — Telehealth: Payer: Self-pay | Admitting: Internal Medicine

## 2018-03-18 NOTE — Telephone Encounter (Signed)
Copied from [place] [id number]. Topic: Quick Communication - See Telephone Encounter >> Mar 18, 2018  4:39 PM Vernona Rieger wrote: CRM for notification. See Telephone encounter for:   03/18/18.  Patient said she received a "colo guard" test in the mail. She said she is not doing it and told the dr she did not want to. She wants to know why it was sent to her and what she needs to do with it bc she is not paying for it. Call pt back [phone]

## 2018-03-19 NOTE — Telephone Encounter (Signed)
Patient calling again, would like call back [phone]

## 2018-03-19 NOTE — Telephone Encounter (Signed)
Pt contacted and informed that she can discard the box that was sent.

## 2018-03-30 ENCOUNTER — Other Ambulatory Visit: Payer: Self-pay | Admitting: Internal Medicine

## 2018-03-30 DIAGNOSIS — E039 Hypothyroidism, unspecified: Secondary | ICD-10-CM

## 2018-05-08 ENCOUNTER — Other Ambulatory Visit: Payer: Self-pay | Admitting: Internal Medicine

## 2018-05-08 DIAGNOSIS — E039 Hypothyroidism, unspecified: Secondary | ICD-10-CM

## 2018-06-20 ENCOUNTER — Other Ambulatory Visit: Payer: Self-pay | Admitting: Internal Medicine

## 2018-08-26 ENCOUNTER — Other Ambulatory Visit: Payer: Self-pay | Admitting: Internal Medicine

## 2018-08-26 DIAGNOSIS — I10 Essential (primary) hypertension: Secondary | ICD-10-CM

## 2018-10-16 ENCOUNTER — Telehealth: Payer: Self-pay | Admitting: Internal Medicine

## 2018-10-16 NOTE — Telephone Encounter (Signed)
Copied from [place] [id number]. Topic: Quick Communication - Rx Refill/Question >> Oct 16, 2018 11:27 AM Burchel, Abbi R wrote: Medication: ranitidine (ZANTAC) 300 MG tablet  Pt requesting a different medication, due to recent concerns about cancer risks. Please advise.   Preferred Pharmacy: [facility] [address], [place] - [place] AT [address] [place] [address] Phone: [phone] Fax: [phone]    Pt: [phone]

## 2018-10-19 ENCOUNTER — Other Ambulatory Visit: Payer: Self-pay | Admitting: Internal Medicine

## 2018-10-19 DIAGNOSIS — K219 Gastro-esophageal reflux disease without esophagitis: Secondary | ICD-10-CM

## 2018-10-19 MED ORDER — FAMOTIDINE 40 MG PO TABS: 40.0000 mg | ORAL_TABLET | Freq: Every day | ORAL | 1 refills | Status: DC

## 2018-10-19 NOTE — Telephone Encounter (Signed)
Routing to dr jones, please advise, thanks 

## 2018-11-16 ENCOUNTER — Encounter: Payer: Medicare Other | Admitting: Internal Medicine

## 2018-12-07 ENCOUNTER — Encounter: Payer: Self-pay | Admitting: Internal Medicine

## 2018-12-07 ENCOUNTER — Other Ambulatory Visit (INDEPENDENT_AMBULATORY_CARE_PROVIDER_SITE_OTHER): Payer: Medicare Other

## 2018-12-07 ENCOUNTER — Ambulatory Visit (INDEPENDENT_AMBULATORY_CARE_PROVIDER_SITE_OTHER): Payer: Medicare Other | Admitting: Internal Medicine

## 2018-12-07 VITALS — BP 118/76 | HR 74 | Temp 97.6°F | Ht 65.0 in | Wt 170.0 lb

## 2018-12-07 DIAGNOSIS — R945 Abnormal results of liver function studies: Secondary | ICD-10-CM | POA: Diagnosis not present

## 2018-12-07 DIAGNOSIS — E781 Pure hyperglyceridemia: Secondary | ICD-10-CM

## 2018-12-07 DIAGNOSIS — E039 Hypothyroidism, unspecified: Secondary | ICD-10-CM | POA: Diagnosis not present

## 2018-12-07 DIAGNOSIS — M79672 Pain in left foot: Secondary | ICD-10-CM

## 2018-12-07 DIAGNOSIS — L719 Rosacea, unspecified: Secondary | ICD-10-CM

## 2018-12-07 DIAGNOSIS — Z0000 Encounter for general adult medical examination without abnormal findings: Secondary | ICD-10-CM

## 2018-12-07 DIAGNOSIS — I10 Essential (primary) hypertension: Secondary | ICD-10-CM

## 2018-12-07 DIAGNOSIS — D75839 Thrombocytosis, unspecified: Secondary | ICD-10-CM

## 2018-12-07 DIAGNOSIS — E785 Hyperlipidemia, unspecified: Secondary | ICD-10-CM

## 2018-12-07 DIAGNOSIS — R7303 Prediabetes: Secondary | ICD-10-CM

## 2018-12-07 DIAGNOSIS — J4520 Mild intermittent asthma, uncomplicated: Secondary | ICD-10-CM

## 2018-12-07 DIAGNOSIS — D473 Essential (hemorrhagic) thrombocythemia: Secondary | ICD-10-CM

## 2018-12-07 DIAGNOSIS — M79671 Pain in right foot: Secondary | ICD-10-CM

## 2018-12-07 DIAGNOSIS — R7989 Other specified abnormal findings of blood chemistry: Secondary | ICD-10-CM

## 2018-12-07 LAB — COMPREHENSIVE METABOLIC PANEL
ALT: 34 U/L (ref 0–35)
AST: 23 U/L (ref 0–37)
Albumin: 4.2 g/dL (ref 3.5–5.2)
Alkaline Phosphatase: 79 U/L (ref 39–117)
BUN: 27 mg/dL — AB (ref 6–23)
CO2: 27 mEq/L (ref 19–32)
CREATININE: 0.96 mg/dL (ref 0.40–1.20)
Calcium: 9.9 mg/dL (ref 8.4–10.5)
Chloride: 104 mEq/L (ref 96–112)
GFR: 60.21 mL/min (ref >60.00)
Glucose, Bld: 113 mg/dL — ABNORMAL HIGH (ref 70–99)
Potassium: 4.2 mEq/L (ref 3.5–5.1)
Sodium: 140 mEq/L (ref 135–145)
Total Bilirubin: 0.4 mg/dL (ref 0.2–1.2)
Total Protein: 6.8 g/dL (ref 6.0–8.3)

## 2018-12-07 LAB — CBC WITH DIFFERENTIAL/PLATELET
Basophils Absolute: 0.1 10*3/uL (ref 0.0–0.1)
Basophils Relative: 1.3 % (ref 0.0–3.0)
Eosinophils Absolute: 0.2 10*3/uL (ref 0.0–0.7)
Eosinophils Relative: 4.3 % (ref 0.0–5.0)
HCT: 44.1 % (ref 36.0–46.0)
HEMOGLOBIN: 14.7 g/dL (ref 12.0–15.0)
Lymphocytes Relative: 17 % (ref 12.0–46.0)
Lymphs Abs: 1 10*3/uL (ref 0.7–4.0)
MCHC: 33.3 g/dL (ref 30.0–36.0)
MCV: 88 fl (ref 78.0–100.0)
Monocytes Absolute: 0.4 10*3/uL (ref 0.1–1.0)
Monocytes Relative: 6.3 % (ref 3.0–12.0)
NEUTROS PCT: 71.1 % (ref 43.0–77.0)
Neutro Abs: 4 10*3/uL (ref 1.4–7.7)
PLATELETS: 331 10*3/uL (ref 150.0–400.0)
RBC: 5.02 Mil/uL (ref 3.87–5.11)
RDW: 13.7 % (ref 11.5–15.5)
WBC: 5.7 10*3/uL (ref 4.0–10.5)

## 2018-12-07 LAB — LIPID PANEL
Cholesterol: 246 mg/dL — ABNORMAL HIGH (ref 0–200)
HDL: 53.9 mg/dL (ref >39.00)
NonHDL: 191.99
Total CHOL/HDL Ratio: 5
Triglycerides: 213 mg/dL — ABNORMAL HIGH (ref 0.0–149.0)
VLDL: 42.6 mg/dL — ABNORMAL HIGH (ref 0.0–40.0)

## 2018-12-07 LAB — HEMOGLOBIN A1C: HEMOGLOBIN A1C: 6.2 % (ref 4.6–6.5)

## 2018-12-07 LAB — LDL CHOLESTEROL, DIRECT: Direct LDL: 173 mg/dL

## 2018-12-07 LAB — TSH: TSH: 4.36 u[IU]/mL (ref 0.35–4.50)

## 2018-12-07 MED ORDER — METRONIDAZOLE 0.75 % EX CREA: TOPICAL_CREAM | Freq: Two times a day (BID) | CUTANEOUS | 2 refills | Status: DC

## 2018-12-07 MED ORDER — ALBUTEROL SULFATE HFA 108 (90 BASE) MCG/ACT IN AERS: 2.0000 | INHALATION_SPRAY | Freq: Four times a day (QID) | RESPIRATORY_TRACT | 3 refills | Status: DC | PRN

## 2018-12-07 NOTE — Patient Instructions (Signed)

## 2018-12-07 NOTE — Progress Notes (Signed)
Subjective:  Patient ID: Natalie Shannon, female    DOB: 27-Sep-1941  Age: 77 y.o. MRN: [id number]  CC: Annual Exam; Hypertension; Hypothyroidism; and Hyperlipidemia   HPI Natalie Shannon presents for a CPX.  She complains of rare episodes of wheezing and needs a refill on her inhalers.  She tells me her blood pressure has been well controlled.  She is very active and denies any recent episodes of CP, DOE, palpitations, edema, or fatigue.  Complains of bilateral foot pain with activity and has calluses on her feet.  Past Medical History:  Diagnosis Date  . Asthma   . GERD (gastroesophageal reflux disease)   . Hyperlipidemia   . Hypertension   . Pneumothorax, traumatic 1993   rib fracture x4 (thrown from horse)  . 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 COLONOSCOPY     Neg X3, benign polyps ;due 2019, Dr Sharlett Iles  . DILATION AND CURETTAGE OF UTERUS  02/2007    Dr Radene Knee  . TONSILLECTOMY      reports that she quit smoking about 50 years ago. She has quit using smokeless tobacco. She reports that she does not drink alcohol or use drugs. family history includes Asthma in her father; Atrial fibrillation in her brother; Breast cancer in her maternal aunt; Diabetes in her mother; Heart attack in her maternal uncle; Hypertension in her mother; Kidney disease in her brother; Stroke in her maternal grandfather; Stroke (age of onset: 43) in her mother. Allergies  Allergen Reactions  . Latex     rash  . Povidone-Iodine     edema  . Simvastatin Other (See Comments)    fatigue  . Sulfonamide Derivatives     Mental status changes, altered vision  . Azithromycin Other (See Comments)    Patient stated that is gave severe heart burn and chest pain.   . Doxycycline Other (See Comments)    Patient stated that it gave her severe heart burn and chest pain.     Outpatient Medications Prior to Visit  Medication Sig Dispense Refill  . Alum  Hydroxide-Mag Carbonate (GAVISCON EXTRA STRENGTH) 160-105 MG CHEW Chew 1 tablet by mouth 3 (three) times daily with meals as needed. 90 tablet 11  . famotidine (PEPCID) 40 MG tablet Take 1 tablet (40 mg total) by mouth daily. 90 tablet 1  . levothyroxine (SYNTHROID, LEVOTHROID) 100 MCG tablet TAKE 1 TABLET BY MOUTH ONCE DAILY 90 tablet 0  . losartan (COZAAR) 100 MG tablet TAKE 1 TABLET BY MOUTH ONCE DAILY 90 tablet 0  . montelukast (SINGULAIR) 10 MG tablet Take 1 tablet (10 mg total) by mouth at bedtime. 90 tablet 3  . albuterol (PROVENTIL HFA;VENTOLIN HFA) 108 (90 Base) MCG/ACT inhaler Inhale 2 puffs into the lungs every 6 (six) hours as needed for wheezing or shortness of breath. 1 Inhaler prn  . aspirin 81 MG tablet Take 81 mg by mouth daily.    . budesonide (PULMICORT FLEXHALER) 180 MCG/ACT inhaler Inhale 2 puffs into the lungs 2 (two) times daily. 3 Inhaler 3  . desonide (DESOWEN) 0.05 % ointment Apply 1 application topically daily as needed.    Marland Kitchen levocetirizine (XYZAL) 5 MG tablet Take 1 tablet (5 mg total) by mouth every evening. 90 tablet 1  . loteprednol (LOTEMAX) 0.2 % SUSP Place 1 drop into both eyes daily. 10 mL 5  . metroNIDAZOLE (METROCREAM) 0.75 % cream Apply topically 2 (two) times daily. [place]  g 0  . triamcinolone (NASACORT ALLERGY 24HR) 55 MCG/ACT AERO nasal inhaler Place 4 sprays into the nose daily. 16.5 mL 11   No facility-administered medications prior to visit.     ROS Review of Systems  Constitutional: Negative for chills, diaphoresis, fatigue and fever.  HENT: Negative.   Eyes: Negative for visual disturbance.  Respiratory: Positive for wheezing. Negative for cough, chest tightness and shortness of breath.   Cardiovascular: Negative.  Negative for chest pain, palpitations and leg swelling.  Gastrointestinal: Negative for abdominal pain, constipation, diarrhea and nausea.  Endocrine: Negative for cold intolerance and heat intolerance.  Genitourinary: Negative.  Negative  for difficulty urinating.  Musculoskeletal: Negative.  Negative for arthralgias and myalgias.  Skin: Positive for rash. Negative for color change.       +facial rash  Neurological: Negative.  Negative for dizziness, weakness, light-headedness and headaches.  Hematological: Negative for adenopathy. Does not bruise/bleed easily.  Psychiatric/Behavioral: Negative.     Objective:  BP 118/76   Pulse 74   Temp 97.6 F (36.4 C) (Oral)   Ht 5\' 5"  (1.651 m)   Wt 170 lb (77.1 kg)   SpO2 94%   BMI 28.29 kg/m   BP Readings from Last 3 Encounters:  12/07/18 118/76  03/11/18 120/80  12/04/17 124/72    Wt Readings from Last 3 Encounters:  12/07/18 170 lb (77.1 kg)  03/11/18 164 lb 4 oz (74.5 kg)  12/04/17 168 lb (76.2 kg)    Physical Exam  Constitutional: She is oriented to person, place, and time. No distress.  HENT:  Mouth/Throat: Oropharynx is clear and moist. No oropharyngeal exudate.  Eyes: Conjunctivae are normal. No scleral icterus.  Neck: Normal range of motion. Neck supple. No JVD present. No thyromegaly present.  Cardiovascular: Normal rate, regular rhythm and normal heart sounds. Exam reveals no gallop.  No murmur heard. Pulmonary/Chest: Effort normal and breath sounds normal. No respiratory distress. She has no wheezes. She has no rales.  Abdominal: Soft. Normal appearance and bowel sounds are normal. She exhibits no mass. There is no hepatosplenomegaly. There is no tenderness.  Genitourinary:  Genitourinary Comments: Breast, GU, rectal exams were deferred at her request.  Musculoskeletal: Normal range of motion. She exhibits no edema, tenderness or deformity.  Lymphadenopathy:    She has no cervical adenopathy.  Neurological: She is alert and oriented to person, place, and time.  Skin: Skin is warm and dry. She is not diaphoretic. No pallor.  Vitals reviewed.   Lab Results  Component Value Date   WBC 5.7 12/07/2018   HGB 14.7 12/07/2018   HCT 44.1 12/07/2018    PLT 331.0 12/07/2018   GLUCOSE 113 (H) 12/07/2018   CHOL 246 (H) 12/07/2018   TRIG 213.0 (H) 12/07/2018   HDL 53.90 12/07/2018   LDLDIRECT 173.0 12/07/2018   LDLCALC 53 05/17/2014   ALT 34 12/07/2018   AST 23 12/07/2018   NA 140 12/07/2018   K 4.2 12/07/2018   CL 104 12/07/2018   CREATININE 0.96 12/07/2018   BUN 27 (H) 12/07/2018   CO2 27 12/07/2018   TSH 4.36 12/07/2018   INR 1.01 05/15/2012   HGBA1C 6.2 12/07/2018    Dg Chest 2 View  Result Date: 03/12/2013 *RADIOLOGY REPORT* Clinical Data: Physical exam.  Asthma, prior smoker. CHEST - 2 VIEW Comparison: 05/15/2012 Findings: Heart is normal size.  Lungs are clear.  No effusions. Old healed bilateral rib fractures.  No visible acute fracture. IMPRESSION: No acute cardiopulmonary disease. Original Report Authenticated  By: Rolm Baptise, M.D.    Assessment & Plan:   Kazandra was seen today for annual exam, hypertension, hypothyroidism and hyperlipidemia.  Diagnoses and all orders for this visit:  Essential hypertension- Her blood pressure is well controlled.  Electrolytes and renal function are normal. -     Comprehensive metabolic panel; Future  Acquired hypothyroidism- Her TSH is in the normal range.  She will remain on the current dose of levothyroxine. -     TSH; Future  Thrombocytosis (HCC)-platelet count is normal now. -     CBC with Differential/Platelet; Future  Elevated LFTs-her LFTs are normal now. -     Comprehensive metabolic panel; Future  Hyperlipidemia LDL goal <130-she has an elevated ASCVD risk score but is not willing to take a statin for CV risk reduction. -     Lipid panel; Future  Prediabetes- Her A1c is at 6.2%.  Medical therapy is not indicated.  She was encouraged to improve her lifestyle modifications. -     Hemoglobin A1c; Future  Pure hyperglyceridemia- Her triglycerides are modestly elevated and do not require treatment at this time. -     Lipid panel; Future  Asthma, mild intermittent,  well-controlled -     albuterol (PROVENTIL HFA;VENTOLIN HFA) 108 (90 Base) MCG/ACT inhaler; Inhale 2 puffs into the lungs every 6 (six) hours as needed for wheezing or shortness of breath. -     budesonide (PULMICORT FLEXHALER) 180 MCG/ACT inhaler; Inhale 1 puff into the lungs 2 (two) times daily.  Rosacea, acne -     metroNIDAZOLE (METROCREAM) 0.75 % cream; Apply topically 2 (two) times daily.  Foot pain, bilateral -     Ambulatory referral to [place] care maintenance   I have discontinued Stanton Kidney A. Luevanos's aspirin, triamcinolone, desonide, loteprednol, and levocetirizine. I have also changed her budesonide. Additionally, I am having her maintain her Alum Hydroxide-Mag Carbonate, montelukast, levothyroxine, losartan, famotidine, albuterol, and metroNIDAZOLE.  Meds ordered this encounter  Medications  . albuterol (PROVENTIL HFA;VENTOLIN HFA) 108 (90 Base) MCG/ACT inhaler    Sig: Inhale 2 puffs into the lungs every 6 (six) hours as needed for wheezing or shortness of breath.    Dispense:  1 Inhaler    Refill:  3  . metroNIDAZOLE (METROCREAM) 0.75 % cream    Sig: Apply topically 2 (two) times daily.    Dispense:  45 g    Refill:  2  . budesonide (PULMICORT FLEXHALER) 180 MCG/ACT inhaler    Sig: Inhale 1 puff into the lungs 2 (two) times daily.    Dispense:  3 Inhaler    Refill:  1   See AVS for instructions about healthy living and anticipatory guidance.  Follow-up: Return in about 6 months (around 06/08/2019).  Scarlette Calico, MD

## 2018-12-08 ENCOUNTER — Telehealth: Payer: Self-pay

## 2018-12-08 MED ORDER — BUDESONIDE 180 MCG/ACT IN AEPB: 1.0000 | INHALATION_SPRAY | Freq: Two times a day (BID) | RESPIRATORY_TRACT | 1 refills | Status: DC

## 2018-12-08 NOTE — Telephone Encounter (Signed)
Key: [id number]  PA came back unable to find matching patient.

## 2018-12-08 NOTE — Assessment & Plan Note (Signed)

## 2018-12-10 ENCOUNTER — Telehealth: Payer: Self-pay | Admitting: Internal Medicine

## 2018-12-10 NOTE — Telephone Encounter (Signed)
Copied from [place] [id number]. Topic: Appointment Scheduling - Prior Auth Required for Appointment >> Dec 10, 2018  1:05 PM Blase Mess A wrote: Patient is calling because her dentist is wanting the patient to be seen by her PCP to rule out pink eye and shingles.  The dentist will be out of the office next week. The patient is hoping to have an appt on December 14, 2018.  The Patient was given an appt in [facility] January. Please advise [phone]  Patient is requesting a call back today.

## 2018-12-10 NOTE — Telephone Encounter (Signed)
appt made

## 2018-12-10 NOTE — Telephone Encounter (Signed)
Anytime with PCP is fine.

## 2018-12-10 NOTE — Telephone Encounter (Signed)
Do you want patient worked in or to see another PCP

## 2018-12-11 ENCOUNTER — Encounter (HOSPITAL_COMMUNITY): Payer: Self-pay

## 2018-12-11 ENCOUNTER — Emergency Department (HOSPITAL_COMMUNITY)
Admission: EM | Admit: 2018-12-11 | Discharge: 2018-12-11 | Disposition: A | Discharge status: Home / Self Care | Payer: Medicare Other | Attending: Emergency Medicine | Admitting: Emergency Medicine

## 2018-12-11 ENCOUNTER — Other Ambulatory Visit: Payer: Self-pay

## 2018-12-11 DIAGNOSIS — I10 Essential (primary) hypertension: Secondary | ICD-10-CM | POA: Insufficient documentation

## 2018-12-11 DIAGNOSIS — R519 Headache, unspecified: Secondary | ICD-10-CM

## 2018-12-11 DIAGNOSIS — R51 Headache: Secondary | ICD-10-CM | POA: Insufficient documentation

## 2018-12-11 DIAGNOSIS — Z79899 Other long term (current) drug therapy: Secondary | ICD-10-CM | POA: Diagnosis not present

## 2018-12-11 DIAGNOSIS — Z91040 Latex allergy status: Secondary | ICD-10-CM | POA: Insufficient documentation

## 2018-12-11 HISTORY — DX: Personal history of (healed) traumatic fracture: Z87.81

## 2018-12-11 LAB — C-REACTIVE PROTEIN: CRP: 0.8 mg/dL (ref <1.0)

## 2018-12-11 LAB — CBC WITH DIFFERENTIAL/PLATELET
Abs Immature Granulocytes: 0.02 10*3/uL (ref 0.00–0.07)
Basophils Absolute: 0.1 10*3/uL (ref 0.0–0.1)
Basophils Relative: 1 %
Eosinophils Absolute: 0.1 10*3/uL (ref 0.0–0.5)
Eosinophils Relative: 2 %
HCT: 47 % — ABNORMAL HIGH (ref 36.0–46.0)
Hemoglobin: 14.8 g/dL (ref 12.0–15.0)
Immature Granulocytes: 0 %
Lymphocytes Relative: 12 %
Lymphs Abs: 0.8 10*3/uL (ref 0.7–4.0)
MCH: 28.6 pg (ref 26.0–34.0)
MCHC: 31.5 g/dL (ref 30.0–36.0)
MCV: 90.7 fL (ref 80.0–100.0)
Monocytes Absolute: 0.4 10*3/uL (ref 0.1–1.0)
Monocytes Relative: 6 %
Neutro Abs: 5.5 10*3/uL (ref 1.7–7.7)
Neutrophils Relative %: 79 %
Platelets: 350 10*3/uL (ref 150–400)
RBC: 5.18 MIL/uL — ABNORMAL HIGH (ref 3.87–5.11)
RDW: 13.2 % (ref 11.5–15.5)
WBC: 6.9 10*3/uL (ref 4.0–10.5)
nRBC: 0 % (ref 0.0–0.2)

## 2018-12-11 LAB — SEDIMENTATION RATE: Sed Rate: 4 mm/hr (ref 0–22)

## 2018-12-11 NOTE — ED Notes (Signed)
ED Provider at bedside. 

## 2018-12-11 NOTE — ED Triage Notes (Signed)
Pt to ER sent by PCP for temporal arteritis workup due to significant facial pain to left face onset Sunday, she denies any pain at this time, denies blurred vision, denies recent illness. Pt in NAD.

## 2018-12-11 NOTE — ED Notes (Signed)
Patient verbalizes understanding of discharge instructions. Opportunity for questioning and answers were provided. Armband removed by staff, pt discharged from ED. Pt ambulatory to lobby.  

## 2018-12-11 NOTE — Discharge Instructions (Addendum)
Your Sed Rate and CRP are both normal. It's highly unlikely that your symptoms are from temporal arteritis. Follow-up with your PCP.

## 2018-12-11 NOTE — ED Provider Notes (Signed)
[facility] Provider Note   CSN: [id number] Arrival date & time: 12/11/18  1357     History   Chief Complaint No chief complaint on file.   HPI Natalie Shannon is a 77 y.o. female.  HPI  77 year old female with right-sided headache.  Onset very early Tuesday morning.  She describes sharp pain in her right temporal region and her right upper teeth.  Lasted a couple days and has since resolved.  She has noticed sensitivity to hot food.  Teeth also sensitive to touch but this is since improved.  She states that she has been evaluated by both her dentist who recommended she see her PCP and an ophthalmologist.  She saw her ophthalmologist today who referred her to the emergency room over concern for possible temporal arteritis.  Past Medical History:  Diagnosis Date  . Asthma   . GERD (gastroesophageal reflux disease)   . Hyperlipidemia   . Hypertension   . Pneumothorax, traumatic 1993   rib fracture x4 (thrown from horse)  . Urticaria    ? solar induced    Patient Active Problem List   Diagnosis Date Noted  . Rosacea, acne 12/07/2018  . Foot pain, bilateral 12/07/2018  . Elevated LFTs 03/11/2018  . Estrogen deficiency 03/11/2018  . Thrombocytosis ([place]) 11/13/2016  . Pure hyperglyceridemia 10/30/2016  . Routine general medical examination at a health care facility 11/01/2015  . Health care maintenance 10/25/2014  . Hypothyroidism 06/24/2008  . Essential hypertension 06/24/2008  . Hyperlipidemia LDL goal <130 11/02/2007  . GERD 06/25/2007  . Asthma, mild intermittent, well-controlled 05/27/2007  . Prediabetes 05/27/2007    Past Surgical History:  Procedure Laterality Date  . COLONOSCOPY     Neg X3, benign polyps ;due 2019, Dr Sharlett Iles  . DILATION AND CURETTAGE OF UTERUS  02/2007    Dr Radene Knee  . TONSILLECTOMY       OB History   No obstetric history on file.      Home Medications    Prior to Admission medications     Medication Sig Start Date End Date Taking? Authorizing Provider  albuterol (PROVENTIL HFA;VENTOLIN HFA) 108 (90 Base) MCG/ACT inhaler Inhale 2 puffs into the lungs every 6 (six) hours as needed for wheezing or shortness of breath. 12/07/18   Janith Lima, MD  Alum Hydroxide-Mag Carbonate (GAVISCON EXTRA STRENGTH) 160-105 MG CHEW Chew 1 tablet by mouth 3 (three) times daily with meals as needed. 10/30/16   Janith Lima, MD  budesonide (PULMICORT FLEXHALER) 180 MCG/ACT inhaler Inhale 1 puff into the lungs 2 (two) times daily. 12/08/18   Janith Lima, MD  famotidine (PEPCID) 40 MG tablet Take 1 tablet (40 mg total) by mouth daily. 10/19/18   Janith Lima, MD  levothyroxine (SYNTHROID, LEVOTHROID) 100 MCG tablet TAKE 1 TABLET BY MOUTH ONCE DAILY 05/09/18   Janith Lima, MD  losartan (COZAAR) 100 MG tablet TAKE 1 TABLET BY MOUTH ONCE DAILY 08/27/18   Janith Lima, MD  metroNIDAZOLE (METROCREAM) 0.75 % cream Apply topically 2 (two) times daily. 12/07/18   Janith Lima, MD  montelukast (SINGULAIR) 10 MG tablet Take 1 tablet (10 mg total) by mouth at bedtime. 12/03/17   Janith Lima, MD    Family History Family History  Problem Relation Age of Onset  . Asthma Father   . Diabetes Mother   . Stroke Mother 36       post carotid endarterectomy  . Hypertension Mother   .  Atrial fibrillation Brother   . Kidney disease Brother        idiopathic  . Stroke Maternal Grandfather        in 71s  . Heart attack Maternal Uncle        after 55  . Breast cancer Maternal Aunt     Social History Social History   Tobacco Use  . Smoking status: Former Smoker    Last attempt to quit: 12/31/1967    Years since quitting: 50.9  . Smokeless tobacco: Former Systems developer  . Tobacco comment: smoked 1965-1969, up 1/2 ppd  Substance Use Topics  . Alcohol use: No  . Drug use: No     Allergies   Latex; Povidone-iodine; Simvastatin; Sulfonamide derivatives; Azithromycin; and Doxycycline   Review of  Systems Review of Systems  All systems reviewed and negative, other than as noted in HPI.  Physical Exam Updated Vital Signs BP 100/83 (BP Location: Left Arm)   Pulse 79   Temp 97.8 F (36.6 C) (Oral)   Resp 18   SpO2 92%   Physical Exam Vitals signs and nursing note reviewed.  Constitutional:      General: She is not in acute distress.    Appearance: She is well-developed.  HENT:     Head: Normocephalic and atraumatic.  Eyes:     General:        Right eye: No discharge.        Left eye: No discharge.     Extraocular Movements: Extraocular movements intact.     Pupils: Pupils are equal, round, and reactive to light.     Comments: Small subconjunctival hemorrhage right eye.  Neck:     Musculoskeletal: Neck supple.  Cardiovascular:     Rate and Rhythm: Normal rate and regular rhythm.     Heart sounds: Normal heart sounds. No murmur. No friction rub. No gallop.   Pulmonary:     Effort: Pulmonary effort is normal. No respiratory distress.     Breath sounds: Normal breath sounds.  Abdominal:     General: There is no distension.     Palpations: Abdomen is soft.     Tenderness: There is no abdominal tenderness.  Musculoskeletal:        General: No tenderness.  Skin:    General: Skin is warm and dry.  Neurological:     Mental Status: She is alert.  Psychiatric:        Behavior: Behavior normal.        Thought Content: Thought content normal.      ED Treatments / Results  Labs (all labs ordered are listed, but only abnormal results are displayed) Labs Reviewed - No data to display  EKG None  Radiology No results found.  Procedures Procedures (including critical care time)  Medications Ordered in ED Medications - No data to display   Initial Impression / Assessment and Plan / ED Course  I have reviewed the triage vital signs and the nursing notes.  Pertinent labs & imaging results that were available during my care of the patient were reviewed by me and  considered in my medical decision making (see chart for details).     77 year old female with right facial pain which has pretty much resolved at this point.  Neuro exam is nonfocal.  No concerning rash noted.  Exam is unremarkable aside from a small subconjunctival hemorrhage to the right eye.  Dentition with some fillings but otherwise appears normal.  Teeth are nontender to percussion.  Will check inflammatory markers and a CBC.  If they are elevated will place her on high-dose steroids with follow-up for temporal artery biopsy.  She reports she has appoint with her PCP already scheduled for Monday.  ESR and CRP normal. Highly unlikely giant cell arteritis. I doubt other emergent etiology. PCP FU.   Final Clinical Impressions(s) / ED Diagnoses   Final diagnoses:  Right sided facial pain    ED Discharge Orders    None       Virgel Manifold, MD 12/13/18 2030

## 2018-12-14 ENCOUNTER — Encounter: Payer: Self-pay | Admitting: Internal Medicine

## 2018-12-14 ENCOUNTER — Ambulatory Visit: Payer: Medicare Other | Admitting: Internal Medicine

## 2018-12-14 VITALS — BP 140/80 | HR 62 | Temp 97.6°F | Resp 16 | Ht 65.0 in | Wt 169.2 lb

## 2018-12-14 DIAGNOSIS — G500 Trigeminal neuralgia: Secondary | ICD-10-CM | POA: Insufficient documentation

## 2018-12-14 DIAGNOSIS — Z23 Encounter for immunization: Secondary | ICD-10-CM | POA: Insufficient documentation

## 2018-12-14 DIAGNOSIS — E785 Hyperlipidemia, unspecified: Secondary | ICD-10-CM | POA: Diagnosis not present

## 2018-12-14 MED ORDER — PREGABALIN 25 MG PO CAPS: 25.0000 mg | ORAL_CAPSULE | Freq: Three times a day (TID) | ORAL | 2 refills | Status: DC

## 2018-12-14 MED ORDER — ZOSTER VAC RECOMB ADJUVANTED 50 MCG/0.5ML IM SUSR: 0.5000 mL | Freq: Once | INTRAMUSCULAR | 1 refills | Status: AC

## 2018-12-14 MED ORDER — PITAVASTATIN CALCIUM 1 MG PO TABS: 1.0000 | ORAL_TABLET | Freq: Every day | ORAL | 1 refills | Status: DC

## 2018-12-14 NOTE — Patient Instructions (Signed)
Trigeminal Neuralgia Trigeminal neuralgia is a nerve disorder that causes attacks of severe facial pain. The attacks last from a few seconds to several minutes. They can happen for days, weeks, or months and then go away for months or years. Trigeminal neuralgia is also called tic douloureux. What are the causes? This condition is caused by damage to a nerve in the face that is called the trigeminal nerve. An attack can be triggered by:  Talking.  Chewing.  Putting on makeup.  Washing your face.  Shaving your face.  Brushing your teeth.  Touching your face.  What increases the risk? This condition is more likely to develop in:  Women.  People who are 50 years of age or older.  What are the signs or symptoms? The main symptom of this condition is pain in the jaw, lips, eyes, nose, scalp, forehead, and face. The pain may be intense, stabbing, electric, or shock-like. How is this diagnosed? This condition is diagnosed with a physical exam. A CT scan or MRI may be done to rule out other conditions that can cause facial pain. How is this treated? This condition may be treated with:  Avoiding the things that trigger your attacks.  Pain medicine.  Surgery. This may be done in severe cases if other medical treatment does not provide relief.  Follow these instructions at home:  Take over-the-counter and prescription medicines only as told by your health care provider.  If you wish to get pregnant, talk with your health care provider before you start trying to get pregnant.  Avoid the things that trigger your attacks. It may help to: ? Chew on the unaffected side of your mouth. ? Avoid touching your face. ? Avoid blasts of hot or cold air. Contact a health care provider if:  Your pain medicine is not helping.  You develop new, unexplained symptoms, such as: ? Double vision. ? Facial weakness. ? Changes in hearing or balance.  You become pregnant. Get help right away  if:  Your pain is unbearable, and your pain medicine does not help. This information is not intended to replace advice given to you by your health care provider. Make sure you discuss any questions you have with your health care provider. Document Released: 12/13/2000 Document Revised: 08/18/2016 Document Reviewed: 04/10/2015 Elsevier Interactive Patient Education  2018 [employer]  

## 2018-12-14 NOTE — Progress Notes (Signed)
Subjective:  Patient ID: Natalie Shannon, female    DOB: [birth year]  Age: 77 y.o. MRN: [id number]  CC: Trigeminal Neuralgia   HPI Natalie Shannon presents for f/up - A week ago she developed right facial pain that she describes as an electrical shock that radiated up into her forehead and into her mouth.  The pain was exacerbated when she chewed hot food.  She was concerned she may have shingles in her eyes so 3 days ago she saw an ophthalmologist and was told that her eyes were normal and there was no evidence of shingles.  Fortunately, she tells me the pain has resolved after a couple of doses of Motrin.  She is also concerned about her elevated cholesterol level and CV risk and wants to try a different statin.  Outpatient Medications Prior to Visit  Medication Sig Dispense Refill  . albuterol (PROVENTIL HFA;VENTOLIN HFA) 108 (90 Base) MCG/ACT inhaler Inhale 2 puffs into the lungs every 6 (six) hours as needed for wheezing or shortness of breath. 1 Inhaler 3  . Alum Hydroxide-Mag Carbonate (GAVISCON EXTRA STRENGTH) 160-105 MG CHEW Chew 1 tablet by mouth 3 (three) times daily with meals as needed. 90 tablet 11  . budesonide (PULMICORT FLEXHALER) 180 MCG/ACT inhaler Inhale 1 puff into the lungs 2 (two) times daily. 3 Inhaler 1  . famotidine (PEPCID) 40 MG tablet Take 1 tablet (40 mg total) by mouth daily. 90 tablet 1  . levothyroxine (SYNTHROID, LEVOTHROID) 100 MCG tablet TAKE 1 TABLET BY MOUTH ONCE DAILY 90 tablet 0  . losartan (COZAAR) 100 MG tablet TAKE 1 TABLET BY MOUTH ONCE DAILY 90 tablet 0  . metroNIDAZOLE (METROCREAM) 0.75 % cream Apply topically 2 (two) times daily. 45 g 2  . montelukast (SINGULAIR) 10 MG tablet Take 1 tablet (10 mg total) by mouth at bedtime. 90 tablet 3   No facility-administered medications prior to visit.     ROS Review of Systems  Constitutional: Negative.   HENT: Negative.   Eyes: Negative for photophobia, pain and redness.  Respiratory: Negative.   Negative for cough and shortness of breath.   Cardiovascular: Negative for chest pain and palpitations.  Gastrointestinal: Negative for abdominal pain, nausea and vomiting.  Endocrine: Negative.   Genitourinary: Negative.  Negative for difficulty urinating.  Musculoskeletal: Negative.  Negative for back pain and neck pain.  Skin: Negative.  Negative for color change and rash.  Neurological: Negative for dizziness, facial asymmetry, weakness, light-headedness, numbness and headaches.  Hematological: Negative for adenopathy. Does not bruise/bleed easily.  Psychiatric/Behavioral: Negative.     Objective:  BP 140/80 (BP Location: Left Arm, Patient Position: Sitting, Cuff Size: Normal)   Pulse 62   Temp 97.6 F (36.4 C) (Oral)   Resp 16   Ht 5\' 5"  (1.651 m)   Wt 169 lb 4 oz (76.8 kg)   SpO2 97%   BMI 28.16 kg/m   BP Readings from Last 3 Encounters:  12/14/18 140/80  12/11/18 112/82  12/07/18 118/76    Wt Readings from Last 3 Encounters:  12/14/18 169 lb 4 oz (76.8 kg)  12/07/18 170 lb (77.1 kg)  03/11/18 164 lb 4 oz (74.5 kg)    Physical Exam Vitals signs reviewed.  Constitutional:      Appearance: Normal appearance.  HENT:     Nose: Nose normal.     Mouth/Throat:     Mouth: Mucous membranes are moist.     Pharynx: No posterior oropharyngeal erythema.  Eyes:  Extraocular Movements: Extraocular movements intact.     Conjunctiva/sclera: Conjunctivae normal.     Pupils: Pupils are equal, round, and reactive to light.  Neck:     Musculoskeletal: Normal range of motion and neck supple. No neck rigidity.  Cardiovascular:     Rate and Rhythm: Normal rate and regular rhythm.     Pulses: Normal pulses.     Heart sounds: No murmur. No gallop.   Pulmonary:     Effort: Pulmonary effort is normal.     Breath sounds: Normal breath sounds. No wheezing, rhonchi or rales.  Abdominal:     General: Bowel sounds are normal.     Palpations: Abdomen is soft. There is no mass.      Tenderness: There is no abdominal tenderness.  Musculoskeletal: Normal range of motion.        General: No swelling, tenderness or deformity.  Lymphadenopathy:     Cervical: No cervical adenopathy.  Skin:    General: Skin is warm and dry.     Findings: No rash.  Neurological:     General: No focal deficit present.     Mental Status: She is alert and oriented to person, place, and time. Mental status is at baseline.     Lab Results  Component Value Date   WBC 6.9 12/11/2018   HGB 14.8 12/11/2018   HCT 47.0 (H) 12/11/2018   PLT 350 12/11/2018   GLUCOSE 113 (H) 12/07/2018   CHOL 246 (H) 12/07/2018   TRIG 213.0 (H) 12/07/2018   HDL 53.90 12/07/2018   LDLDIRECT 173.0 12/07/2018   LDLCALC 53 05/17/2014   ALT 34 12/07/2018   AST 23 12/07/2018   NA 140 12/07/2018   K 4.2 12/07/2018   CL 104 12/07/2018   CREATININE 0.96 12/07/2018   BUN 27 (H) 12/07/2018   CO2 27 12/07/2018   TSH 4.36 12/07/2018   INR 1.01 05/15/2012   HGBA1C 6.2 12/07/2018    No results found.  Assessment & Plan:   Natalie Shannon was seen today for trigeminal neuralgia.  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  Fortunately, the pain has resolved.  She has no history of injury and no other neurological complaints I do not think an MRI is indicated.  She requested something to keep on hand in the event that the pain returns so I recommended that she try pregabalin if needed. -     pregabalin (LYRICA) 25 MG capsule; Take 1 capsule (25 mg total) by mouth 3 (three) times daily.  Hyperlipidemia LDL goal <130- She will try pitavastatin for CV risk reduction. -     Pitavastatin Calcium (LIVALO) 1 MG TABS; Take 1 tablet (1 mg total) by mouth daily.  Need for shingles vaccine -     Zoster Vaccine Adjuvanted [facility]) injection; Inject 0.5 mLs into the muscle once for 1 dose.   I am having  Natalie Shannon start on pregabalin, Pitavastatin Calcium, and Zoster Vaccine Adjuvanted. I am also having her maintain her Alum Hydroxide-Mag Carbonate, montelukast, levothyroxine, losartan, famotidine, albuterol, metroNIDAZOLE, and budesonide.  Meds ordered this encounter  Medications  . pregabalin (LYRICA) 25 MG capsule    Sig: Take 1 capsule (25 mg total) by mouth 3 (three) times daily.    Dispense:  90 capsule    Refill:  2  . Pitavastatin Calcium (LIVALO) 1 MG TABS    Sig: Take  1 tablet (1 mg total) by mouth daily.    Dispense:  90 tablet    Refill:  1  . Zoster Vaccine Adjuvanted [facility]) injection    Sig: Inject 0.5 mLs into the muscle once for 1 dose.    Dispense:  0.5 mL    Refill:  1     Follow-up: Return in about 2 months (around 02/14/2019).  Scarlette Calico, MD

## 2018-12-21 NOTE — Telephone Encounter (Signed)
Called pharmacy and they gave the following info  ID: [id number] BIN: [id number]  PCN: [id number] GRP: [id number]

## 2018-12-22 NOTE — Telephone Encounter (Signed)
With the information below. Pt was not found.

## 2019-01-06 ENCOUNTER — Other Ambulatory Visit: Payer: Self-pay | Admitting: Internal Medicine

## 2019-01-06 DIAGNOSIS — E785 Hyperlipidemia, unspecified: Secondary | ICD-10-CM

## 2019-01-11 ENCOUNTER — Ambulatory Visit: Payer: Medicare Other | Admitting: Internal Medicine

## 2019-02-08 ENCOUNTER — Ambulatory Visit: Payer: Medicare Other | Admitting: Podiatry

## 2019-02-15 ENCOUNTER — Other Ambulatory Visit: Payer: Self-pay | Admitting: Internal Medicine

## 2019-02-15 DIAGNOSIS — I10 Essential (primary) hypertension: Secondary | ICD-10-CM

## 2019-03-02 ENCOUNTER — Other Ambulatory Visit (INDEPENDENT_AMBULATORY_CARE_PROVIDER_SITE_OTHER): Payer: Medicare Other

## 2019-03-02 ENCOUNTER — Encounter: Payer: Self-pay | Admitting: Internal Medicine

## 2019-03-02 ENCOUNTER — Ambulatory Visit: Payer: Medicare Other | Admitting: Internal Medicine

## 2019-03-02 VITALS — BP 130/88 | HR 79 | Temp 97.4°F | Ht 65.0 in | Wt 168.0 lb

## 2019-03-02 DIAGNOSIS — E785 Hyperlipidemia, unspecified: Secondary | ICD-10-CM

## 2019-03-02 DIAGNOSIS — N1832 Chronic kidney disease, stage 3b: Secondary | ICD-10-CM | POA: Insufficient documentation

## 2019-03-02 DIAGNOSIS — E039 Hypothyroidism, unspecified: Secondary | ICD-10-CM | POA: Diagnosis not present

## 2019-03-02 DIAGNOSIS — N183 Chronic kidney disease, stage 3 unspecified: Secondary | ICD-10-CM

## 2019-03-02 DIAGNOSIS — J4520 Mild intermittent asthma, uncomplicated: Secondary | ICD-10-CM

## 2019-03-02 DIAGNOSIS — I10 Essential (primary) hypertension: Secondary | ICD-10-CM | POA: Diagnosis not present

## 2019-03-02 LAB — BASIC METABOLIC PANEL
BUN: 21 mg/dL (ref 6–23)
CO2: 28 mEq/L (ref 19–32)
CREATININE: 1.04 mg/dL (ref 0.40–1.20)
Calcium: 9.4 mg/dL (ref 8.4–10.5)
Chloride: 105 mEq/L (ref 96–112)
GFR: 51.62 mL/min — ABNORMAL LOW (ref >60.00)
Glucose, Bld: 98 mg/dL (ref 70–99)
Potassium: 4.4 mEq/L (ref 3.5–5.1)
Sodium: 142 mEq/L (ref 135–145)

## 2019-03-02 LAB — TSH: TSH: 3.34 u[IU]/mL (ref 0.35–4.50)

## 2019-03-02 MED ORDER — PITAVASTATIN CALCIUM 1 MG PO TABS: ORAL_TABLET | ORAL | 1 refills | Status: DC

## 2019-03-02 MED ORDER — MONTELUKAST SODIUM 10 MG PO TABS: 10.0000 mg | ORAL_TABLET | Freq: Every day | ORAL | 1 refills | Status: DC

## 2019-03-02 MED ORDER — ALBUTEROL SULFATE HFA 108 (90 BASE) MCG/ACT IN AERS: 2.0000 | INHALATION_SPRAY | Freq: Four times a day (QID) | RESPIRATORY_TRACT | 3 refills | Status: DC | PRN

## 2019-03-02 NOTE — Progress Notes (Signed)
Subjective:  Patient ID: Natalie Shannon, female    DOB: [birth year]  Age: 78 y.o. MRN: [id number]  CC: Hypertension; Hypothyroidism; and Hyperlipidemia   HPI Natalie Shannon presents for f/up - She tells me that the facial pain has resolved.  She is no longer taking Lyrica.  She is doing well on the statin with no muscle or joint aches.  Her asthma and allergy symptoms are well controlled.  She tells me her blood pressure has been well controlled and she denies in any recent episodes of CP or DOE.  Outpatient Medications Prior to Visit  Medication Sig Dispense Refill  . Alum Hydroxide-Mag Carbonate (GAVISCON EXTRA STRENGTH) 160-105 MG CHEW Chew 1 tablet by mouth 3 (three) times daily with meals as needed. 90 tablet 11  . budesonide (PULMICORT FLEXHALER) 180 MCG/ACT inhaler Inhale 1 puff into the lungs 2 (two) times daily. 3 Inhaler 1  . famotidine (PEPCID) 40 MG tablet Take 1 tablet (40 mg total) by mouth daily. 90 tablet 1  . levothyroxine (SYNTHROID, LEVOTHROID) 100 MCG tablet TAKE 1 TABLET BY MOUTH ONCE DAILY 90 tablet 0  . losartan (COZAAR) 100 MG tablet TAKE ONE TABLET BY MOUTH DAILY 90 tablet 1  . metroNIDAZOLE (METROCREAM) 0.75 % cream Apply topically 2 (two) times daily. 45 g 2  . pregabalin (LYRICA) 25 MG capsule Take 1 capsule (25 mg total) by mouth 3 (three) times daily. 90 capsule 2  . albuterol (PROVENTIL HFA;VENTOLIN HFA) 108 (90 Base) MCG/ACT inhaler Inhale 2 puffs into the lungs every 6 (six) hours as needed for wheezing or shortness of breath. 1 Inhaler 3  . LIVALO 1 MG TABS Take 1 tablet (1 mg total) by mouth daily. 90 tablet 1  . montelukast (SINGULAIR) 10 MG tablet TAKE ONE TABLET BY MOUTH AT BEDTIME 90 tablet 1   No facility-administered medications prior to visit.     ROS Review of Systems  Constitutional: Negative.  Negative for appetite change, diaphoresis, fatigue and unexpected weight change.  HENT: Negative.   Eyes: Negative for visual disturbance.   Respiratory: Negative for cough, chest tightness, shortness of breath and wheezing.   Cardiovascular: Negative for chest pain, palpitations and leg swelling.  Gastrointestinal: Negative for abdominal pain, constipation, diarrhea, nausea and vomiting.  Genitourinary: Negative.  Negative for difficulty urinating.  Musculoskeletal: Negative.  Negative for arthralgias, myalgias and neck pain.  Skin: Negative.   Neurological: Negative.  Negative for dizziness, weakness, light-headedness and headaches.  Hematological: Negative for adenopathy. Does not bruise/bleed easily.  Psychiatric/Behavioral: Negative.     Objective:  BP 130/88 (BP Location: Left Arm, Patient Position: Sitting, Cuff Size: Normal)   Pulse 79   Temp (!) 97.4 F (36.3 C) (Oral)   Ht 5\' 5"  (1.651 m)   Wt 168 lb (76.2 kg)   SpO2 98%   BMI 27.96 kg/m   BP Readings from Last 3 Encounters:  03/02/19 130/88  12/14/18 140/80  12/11/18 112/82    Wt Readings from Last 3 Encounters:  03/02/19 168 lb (76.2 kg)  12/14/18 169 lb 4 oz (76.8 kg)  12/07/18 170 lb (77.1 kg)    Physical Exam Constitutional:      Appearance: She is not ill-appearing or diaphoretic.  HENT:     Nose: Nose normal. No congestion.     Mouth/Throat:     Mouth: Mucous membranes are moist.     Pharynx: Oropharynx is clear. No oropharyngeal exudate or posterior oropharyngeal erythema.  Eyes:     General:  No scleral icterus.    Conjunctiva/sclera: Conjunctivae normal.  Neck:     Musculoskeletal: Normal range of motion and neck supple. No muscular tenderness.  Cardiovascular:     Rate and Rhythm: Normal rate and regular rhythm.     Heart sounds: No murmur. No gallop.   Pulmonary:     Effort: Pulmonary effort is normal.     Breath sounds: No stridor. No wheezing, rhonchi or rales.  Abdominal:     General: Bowel sounds are normal.     Palpations: There is no mass.     Tenderness: There is no abdominal tenderness. There is no guarding.   Musculoskeletal: Normal range of motion.        General: No swelling.     Right lower leg: No edema.  Lymphadenopathy:     Cervical: No cervical adenopathy.  Skin:    General: Skin is warm and dry.     Coloration: Skin is not pale.  Neurological:     General: No focal deficit present.     Mental Status: She is oriented to person, place, and time. Mental status is at baseline.     Lab Results  Component Value Date   WBC 6.9 12/11/2018   HGB 14.8 12/11/2018   HCT 47.0 (H) 12/11/2018   PLT 350 12/11/2018   GLUCOSE 98 03/02/2019   CHOL 246 (H) 12/07/2018   TRIG 213.0 (H) 12/07/2018   HDL 53.90 12/07/2018   LDLDIRECT 173.0 12/07/2018   LDLCALC 53 05/17/2014   ALT 34 12/07/2018   AST 23 12/07/2018   NA 142 03/02/2019   K 4.4 03/02/2019   CL 105 03/02/2019   CREATININE 1.04 03/02/2019   BUN 21 03/02/2019   CO2 28 03/02/2019   TSH 3.34 03/02/2019   INR 1.01 05/15/2012   HGBA1C 6.2 12/07/2018    No results found.  Assessment & Plan:   Natalie Shannon was seen today for hypertension, hypothyroidism and hyperlipidemia.  Diagnoses and all orders for this visit:  Essential hypertension- Her blood pressure is well controlled.  Electrolytes and renal function are normal. -     Basic metabolic panel; Future  Asthma, mild intermittent, well-controlled -     albuterol (PROVENTIL HFA;VENTOLIN HFA) 108 (90 Base) MCG/ACT inhaler; Inhale 2 puffs into the lungs every 6 (six) hours as needed for wheezing or shortness of breath. -     montelukast (SINGULAIR) 10 MG tablet; Take 1 tablet (10 mg total) by mouth at bedtime.  Hyperlipidemia LDL goal <130- She is doing well on the statin. -     Pitavastatin Calcium (LIVALO) 1 MG TABS; Take 1 tablet (1 mg total) by mouth daily.  Acquired hypothyroidism- Her TSH is in the normal range.  She will remain on the current dose of levothyroxine. -     TSH; Future  CRI (chronic renal insufficiency), stage 3 (moderate) (HCC)- She will avoid nephrotoxic  agents and will continue to maintain control of her blood pressure.   I have changed Natalie Shannon's LIVALO to Pitavastatin Calcium. I have also changed her montelukast. I am also having her maintain her Alum Hydroxide-Mag Carbonate, levothyroxine, famotidine, metroNIDAZOLE, budesonide, pregabalin, losartan, and albuterol.  Meds ordered this encounter  Medications  . albuterol (PROVENTIL HFA;VENTOLIN HFA) 108 (90 Base) MCG/ACT inhaler    Sig: Inhale 2 puffs into the lungs every 6 (six) hours as needed for wheezing or shortness of breath.    Dispense:  1 Inhaler    Refill:  3  . Pitavastatin  Calcium (LIVALO) 1 MG TABS    Sig: Take 1 tablet (1 mg total) by mouth daily.    Dispense:  90 tablet    Refill:  1  . montelukast (SINGULAIR) 10 MG tablet    Sig: Take 1 tablet (10 mg total) by mouth at bedtime.    Dispense:  90 tablet    Refill:  1     Follow-up: Return in about 6 months (around 09/02/2019).  Scarlette Calico, MD

## 2019-03-02 NOTE — Patient Instructions (Signed)
Hypothyroidism  Hypothyroidism is when the thyroid gland does not make enough of certain hormones (it is underactive). The thyroid gland is a small gland located in the lower front part of the neck, just in front of the windpipe (trachea). This gland makes hormones that help control how the body uses food for energy (metabolism) as well as how the heart and brain function. These hormones also play a role in keeping your bones strong. When the thyroid is underactive, it produces too little of the hormones thyroxine (T4) and triiodothyronine (T3). What are the causes? This condition may be caused by:  Hashimoto's disease. This is a disease in which the body's disease-fighting system (immune system) attacks the thyroid gland. This is the most common cause.  Viral infections.  Pregnancy.  Certain medicines.  Birth defects.  Past radiation treatments to the head or neck for cancer.  Past treatment with radioactive iodine.  Past exposure to radiation in the environment.  Past surgical removal of part or all of the thyroid.  Problems with a gland in the center of the brain (pituitary gland).  Lack of enough iodine in the diet. What increases the risk? You are more likely to develop this condition if:  You are female.  You have a family history of thyroid conditions.  You use a medicine called lithium.  You take medicines that affect the immune system (immunosuppressants). What are the signs or symptoms? Symptoms of this condition include:  Feeling as though you have no energy (lethargy).  Not being able to tolerate cold.  Weight gain that is not explained by a change in diet or exercise habits.  Lack of appetite.  Dry skin.  Coarse hair.  Menstrual irregularity.  Slowing of thought processes.  Constipation.  Sadness or depression. How is this diagnosed? This condition may be diagnosed based on:  Your symptoms, your medical history, and a physical exam.  Blood  tests. You may also have imaging tests, such as an ultrasound or MRI. How is this treated? This condition is treated with medicine that replaces the thyroid hormones that your body does not make. After you begin treatment, it may take several weeks for symptoms to go away. Follow these instructions at home:  Take over-the-counter and prescription medicines only as told by your health care provider.  If you start taking any new medicines, tell your health care provider.  Keep all follow-up visits as told by your health care provider. This is important. ? As your condition improves, your dosage of thyroid hormone medicine may change. ? You will need to have blood tests regularly so that your health care provider can monitor your condition. Contact a health care provider if:  Your symptoms do not get better with treatment.  You are taking thyroid replacement medicine and you: ? Sweat a lot. ? Have tremors. ? Feel anxious. ? Lose weight rapidly. ? Cannot tolerate heat. ? Have emotional swings. ? Have diarrhea. ? Feel weak. Get help right away if you have:  Chest pain.  An irregular heartbeat.  A rapid heartbeat.  Difficulty breathing. Summary  Hypothyroidism is when the thyroid gland does not make enough of certain hormones (it is underactive).  When the thyroid is underactive, it produces too little of the hormones thyroxine (T4) and triiodothyronine (T3).  The most common cause is Hashimoto's disease, a disease in which the body's disease-fighting system (immune system) attacks the thyroid gland. The condition can also be caused by viral infections, medicine, pregnancy, or past   radiation treatment to the head or neck.  Symptoms may include weight gain, dry skin, constipation, feeling as though you do not have energy, and not being able to tolerate cold.  This condition is treated with medicine to replace the thyroid hormones that your body does not make. This information  is not intended to replace advice given to you by your health care provider. Make sure you discuss any questions you have with your health care provider. Document Released: 12/16/2005 Document Revised: 11/26/2017 Document Reviewed: 11/26/2017 Elsevier Interactive Patient Education  2019 Elsevier Inc.  

## 2019-03-09 ENCOUNTER — Ambulatory Visit: Payer: Medicare Other | Admitting: Podiatry

## 2019-03-30 ENCOUNTER — Other Ambulatory Visit: Payer: Self-pay | Admitting: Internal Medicine

## 2019-03-30 DIAGNOSIS — E039 Hypothyroidism, unspecified: Secondary | ICD-10-CM

## 2019-03-31 ENCOUNTER — Telehealth: Payer: Self-pay | Admitting: Internal Medicine

## 2019-03-31 NOTE — Telephone Encounter (Signed)
Pt receive her medication yesterday from [facility]. Pt would like to thanks dr Ronnald Ramp

## 2019-03-31 NOTE — Telephone Encounter (Signed)
lvm for pt requesting the name of the new pharmacy. Please let me know where she would like it sent if she call back.

## 2019-03-31 NOTE — Telephone Encounter (Signed)
Pt called and l/m she said that her pharmacy is now closed and wants to talk to the nurse to have medication sent to a different location.

## 2019-05-17 ENCOUNTER — Other Ambulatory Visit: Payer: Self-pay | Admitting: Internal Medicine

## 2019-05-17 DIAGNOSIS — G500 Trigeminal neuralgia: Secondary | ICD-10-CM

## 2019-05-17 DIAGNOSIS — J4520 Mild intermittent asthma, uncomplicated: Secondary | ICD-10-CM

## 2019-05-17 DIAGNOSIS — I10 Essential (primary) hypertension: Secondary | ICD-10-CM

## 2019-05-26 ENCOUNTER — Telehealth: Payer: Self-pay

## 2019-05-26 ENCOUNTER — Other Ambulatory Visit: Payer: Self-pay | Admitting: Internal Medicine

## 2019-05-26 DIAGNOSIS — E785 Hyperlipidemia, unspecified: Secondary | ICD-10-CM

## 2019-05-26 DIAGNOSIS — E039 Hypothyroidism, unspecified: Secondary | ICD-10-CM

## 2019-05-26 DIAGNOSIS — K219 Gastro-esophageal reflux disease without esophagitis: Secondary | ICD-10-CM

## 2019-05-26 DIAGNOSIS — G500 Trigeminal neuralgia: Secondary | ICD-10-CM

## 2019-05-26 DIAGNOSIS — I10 Essential (primary) hypertension: Secondary | ICD-10-CM

## 2019-05-26 DIAGNOSIS — J4520 Mild intermittent asthma, uncomplicated: Secondary | ICD-10-CM

## 2019-05-26 MED ORDER — BUDESONIDE 180 MCG/ACT IN AEPB: 1.0000 | INHALATION_SPRAY | Freq: Two times a day (BID) | RESPIRATORY_TRACT | 1 refills | Status: DC

## 2019-05-26 MED ORDER — PREGABALIN 25 MG PO CAPS: 25.0000 mg | ORAL_CAPSULE | Freq: Three times a day (TID) | ORAL | 5 refills | Status: DC

## 2019-05-26 MED ORDER — PITAVASTATIN CALCIUM 1 MG PO TABS: ORAL_TABLET | ORAL | 1 refills | Status: DC

## 2019-05-26 MED ORDER — MONTELUKAST SODIUM 10 MG PO TABS: 10.0000 mg | ORAL_TABLET | Freq: Every day | ORAL | 1 refills | Status: DC

## 2019-05-26 MED ORDER — LOSARTAN POTASSIUM 100 MG PO TABS: 100.0000 mg | ORAL_TABLET | Freq: Every day | ORAL | 1 refills | Status: DC

## 2019-05-26 MED ORDER — FAMOTIDINE 40 MG PO TABS: 40.0000 mg | ORAL_TABLET | Freq: Every day | ORAL | 1 refills | Status: DC

## 2019-05-26 MED ORDER — ALBUTEROL SULFATE HFA 108 (90 BASE) MCG/ACT IN AERS: 2.0000 | INHALATION_SPRAY | Freq: Four times a day (QID) | RESPIRATORY_TRACT | 3 refills | Status: DC | PRN

## 2019-05-26 NOTE — Telephone Encounter (Signed)
Pt is requesting a refill of:  Albuterol Pulimicort Famotidine Levothyroxine Losartan Pitavastatin Pregabalin Montelukast

## 2019-05-26 NOTE — Telephone Encounter (Signed)
Copied from [place] [phone] [id number]. Topic: General - Other >> May 26, 2019 11:29 AM Rainey Pines A wrote: Patient would like all prescribed medications from Dr. Ronnald Ramp now sent to [facility], [place] [facility] [place] [phone] (Phone) [phone] (Fax) for future reference.

## 2019-06-03 NOTE — Telephone Encounter (Signed)
PCP sent in medications.

## 2019-06-03 NOTE — Telephone Encounter (Addendum)
Received an additional request for PA for Pulmicort.   PA entered with information below. The patient name on the form was not accepted.   The name must be entered as Chauncey Fischer  Key: [id number]

## 2019-06-03 NOTE — Telephone Encounter (Signed)
PA has been approved.   Faxed same to pharmacy.

## 2019-06-14 MED ORDER — LEVOTHYROXINE SODIUM 100 MCG PO TABS: 100.0000 ug | ORAL_TABLET | Freq: Every day | ORAL | 0 refills | Status: DC

## 2019-06-14 NOTE — Addendum Note (Signed)
Addended by: Aviva Signs M on: 06/14/2019 01:20 PM   Modules accepted: Orders

## 2019-06-14 NOTE — Telephone Encounter (Signed)
Patient needs a Rx sent to [facility] so they can have it in stock for her.  Rx is levothyroxine (SYNTHROID, LEVOTHROID) 100 MCG tablet

## 2019-06-14 NOTE — Telephone Encounter (Signed)
erx has been sent.  

## 2019-06-17 ENCOUNTER — Other Ambulatory Visit: Payer: Self-pay | Admitting: Internal Medicine

## 2019-06-17 ENCOUNTER — Telehealth: Payer: Self-pay | Admitting: Internal Medicine

## 2019-06-17 DIAGNOSIS — J4520 Mild intermittent asthma, uncomplicated: Secondary | ICD-10-CM

## 2019-06-17 MED ORDER — PULMICORT FLEXHALER 180 MCG/ACT IN AEPB: 2.0000 | INHALATION_SPRAY | Freq: Two times a day (BID) | RESPIRATORY_TRACT | 1 refills | Status: DC

## 2019-06-17 NOTE — Telephone Encounter (Signed)
Medication: budesonide (PULMICORT FLEXHALER) 180 MCG/ACT inhaler [[id number]] - Pt requesting "two" pumps twice daily.   Has the patient contacted their pharmacy? Yes  (Agent: If no, request that the patient contact the pharmacy for the refill.) (Agent: If yes, when and what did the pharmacy advise?)  Preferred Pharmacy (with phone number or street name): [facility], [place] [address] [place] [address] Phone: [phone] Fax: [phone]    Agent: Please be advised that RX refills may take up to 3 business days. We ask that you follow-up with your pharmacy.

## 2019-11-22 ENCOUNTER — Telehealth: Payer: Self-pay | Admitting: Internal Medicine

## 2019-11-22 DIAGNOSIS — E785 Hyperlipidemia, unspecified: Secondary | ICD-10-CM

## 2019-11-22 DIAGNOSIS — E039 Hypothyroidism, unspecified: Secondary | ICD-10-CM

## 2019-11-22 MED ORDER — LIVALO 1 MG PO TABS: ORAL_TABLET | ORAL | 0 refills | Status: DC

## 2019-11-22 MED ORDER — LEVOTHYROXINE SODIUM 100 MCG PO TABS: 100.0000 ug | ORAL_TABLET | Freq: Every day | ORAL | 0 refills | Status: DC

## 2019-11-22 NOTE — Telephone Encounter (Signed)
Medication Refill - Medication:  levothyroxine (SYNTHROID) 100 MCG tablet Pitavastatin Calcium (LIVALO) 1 MG TABS  Has the patient contacted their pharmacy?  Yes advised to call office. Pt is going out of town tomorrow and would like a refill today with a 90 day supply.   Preferred Pharmacy (with phone number or street name):  [facility], [place]: Please be advised that RX refills may take up to 3 business days. We ask that you follow-up with your pharmacy.

## 2019-12-13 ENCOUNTER — Telehealth: Payer: Self-pay | Admitting: Internal Medicine

## 2019-12-13 DIAGNOSIS — J4520 Mild intermittent asthma, uncomplicated: Secondary | ICD-10-CM

## 2019-12-13 MED ORDER — MONTELUKAST SODIUM 10 MG PO TABS: 10.0000 mg | ORAL_TABLET | Freq: Every day | ORAL | 1 refills | Status: DC

## 2019-12-13 NOTE — Telephone Encounter (Signed)
Pharmacy called in to request a refill for montelukast (SINGULAIR) 10 MG tablet    Pharmacy: [facility] in [place]

## 2019-12-13 NOTE — Telephone Encounter (Signed)
erx has been sent.  

## 2019-12-21 ENCOUNTER — Telehealth: Payer: Self-pay

## 2019-12-21 ENCOUNTER — Other Ambulatory Visit: Payer: Self-pay | Admitting: Internal Medicine

## 2019-12-21 DIAGNOSIS — L719 Rosacea, unspecified: Secondary | ICD-10-CM

## 2019-12-21 NOTE — Telephone Encounter (Signed)
Requested medication (s) are due for refill today: no  Requested medication (s) are on the active medication list: yes  Last refill: 12/08/2019  Future visit scheduled: no  Notes to clinic:  Medication not assigned to a protocol, review manually   Requested Prescriptions  Pending Prescriptions Disp Refills   metroNIDAZOLE (METROCREAM) 0.75 % cream 45 g 2    Sig: Apply topically 2 (two) times daily.      Off-Protocol Failed - 12/21/2019  9:30 AM      Failed - Medication not assigned to a protocol, review manually.      Passed - Valid encounter within last 12 months    Recent Outpatient Visits           9 months ago Essential hypertension   [facility], Thomas L, MD   1 year ago Trigeminal neuralgia of right side of face   [place], Thomas L, MD   1 year ago Essential hypertension   [facility] -[facility] Camel, MD   1 year ago Acquired hypothyroidism   [facility] -[facility] Camel, MD   2 years ago Essential hypertension   [place], Thomas L, MD

## 2019-12-21 NOTE — Telephone Encounter (Signed)
Key: [id number]

## 2019-12-21 NOTE — Telephone Encounter (Signed)
LVM for pt to call back as soon as possible.   RE: PA for pulmicort has been approved. May inform pt of same if/when she calls back.

## 2019-12-21 NOTE — Telephone Encounter (Signed)
Pt called backed asking if she can get a refill on the Metrocream.  She picked up the pulmacort.  She would still like Stephanie to call her back.  She has questions about the Covid vaccine and her allergies.    CB# [phone]

## 2019-12-21 NOTE — Telephone Encounter (Signed)
Medication Refill - Medication: metroNIDAZOLE (METROCREAM) 0.75 % cream  Has the patient contacted their pharmacy? Yes - pharmacy calling (Agent: If no, request that the patient contact the pharmacy for the refill.) (Agent: If yes, when and what did the pharmacy advise?)  Preferred Pharmacy (with phone number or street name):  [facility] [place], [place] - [address] Phone:  [phone]  Fax:  [phone]     Agent: Please be advised that RX refills may take up to 3 business days. We ask that you follow-up with your pharmacy.

## 2019-12-23 NOTE — Telephone Encounter (Signed)
Pharmacy called about refill request for metroNIDAZOLE (METROCREAM) 0.75 % cream  Please send asap to  [facility] [place], [place] - [address] Phone:  [phone]  Fax:  [phone]

## 2019-12-23 NOTE — Telephone Encounter (Signed)
LVM for pt to call back.

## 2019-12-27 MED ORDER — METRONIDAZOLE 0.75 % EX CREA: TOPICAL_CREAM | Freq: Two times a day (BID) | CUTANEOUS | 2 refills | Status: DC

## 2020-02-22 ENCOUNTER — Telehealth: Payer: Self-pay

## 2020-02-22 DIAGNOSIS — I10 Essential (primary) hypertension: Secondary | ICD-10-CM

## 2020-02-22 DIAGNOSIS — J4520 Mild intermittent asthma, uncomplicated: Secondary | ICD-10-CM

## 2020-02-22 DIAGNOSIS — K219 Gastro-esophageal reflux disease without esophagitis: Secondary | ICD-10-CM

## 2020-02-22 DIAGNOSIS — E039 Hypothyroidism, unspecified: Secondary | ICD-10-CM

## 2020-02-22 NOTE — Telephone Encounter (Signed)
Charlotte with [employer] and states that there are 3 reason for the call. 1. Patient needs a refill on femotidine 40 mg . States that the patient is requesting a 90 day supply and that would need to go to  [facility] - [place], [address]  2. Patient changed insurances to [facility]. States that they will not pay for her Pulmacort inhaler that she wants. States that she has started a PA Form and is going to fax over for [place] to send in. Fax number given.  3. States that new insurance/formilary will not cover the medication Levalo. States that she looked into what they would cover and they will pay for either rosuvastatin or simvastatin. Patient is okay taking whatever Dr Ronnald Ramp would prescribe of the two. Needs a new prescription. This would also need to go to [facility] [place], [place]  CB#: [phone]

## 2020-02-28 ENCOUNTER — Telehealth: Payer: Self-pay | Admitting: Internal Medicine

## 2020-02-28 NOTE — Telephone Encounter (Signed)
Please advise on refills below.   Also, pt stated that the [facility] will not be covered. Would you like a PA started or change to a cheaper alternative.

## 2020-02-28 NOTE — Telephone Encounter (Signed)
PA form completed and faxed

## 2020-02-28 NOTE — Telephone Encounter (Signed)
New Message:   Baldo Ash from [place]:  1.Medication Requested: famotidine (PEPCID) 40 MG tablet levothyroxine (SYNTHROID) 100 MCG tablet losartan (COZAAR) 100 MG tablet montelukast (SINGULAIR) 10 MG tablet 2. Pharmacy (Name, Street, [place]):  [facility] [place], [place] - [address]  3. On Med List: yes  4. Last Visit with PCP: 03/02/19  5. Next visit date with PCP: None scheduled

## 2020-02-29 DIAGNOSIS — Z961 Presence of intraocular lens: Secondary | ICD-10-CM | POA: Diagnosis not present

## 2020-02-29 DIAGNOSIS — H18413 Arcus senilis, bilateral: Secondary | ICD-10-CM | POA: Diagnosis not present

## 2020-02-29 DIAGNOSIS — H16223 Keratoconjunctivitis sicca, not specified as Sjogren's, bilateral: Secondary | ICD-10-CM | POA: Diagnosis not present

## 2020-02-29 DIAGNOSIS — H43813 Vitreous degeneration, bilateral: Secondary | ICD-10-CM | POA: Diagnosis not present

## 2020-03-01 NOTE — Telephone Encounter (Signed)
Call [place] with [facility] and inform pt is due for an appointment and an appointment has been scheduled.

## 2020-03-02 MED ORDER — MONTELUKAST SODIUM 10 MG PO TABS: 10.0000 mg | ORAL_TABLET | Freq: Every day | ORAL | 0 refills | Status: DC

## 2020-03-02 MED ORDER — LOSARTAN POTASSIUM 100 MG PO TABS: 100.0000 mg | ORAL_TABLET | Freq: Every day | ORAL | 0 refills | Status: DC

## 2020-03-02 MED ORDER — FAMOTIDINE 40 MG PO TABS: 40.0000 mg | ORAL_TABLET | Freq: Every day | ORAL | 0 refills | Status: DC

## 2020-03-02 MED ORDER — LEVOTHYROXINE SODIUM 100 MCG PO TABS: 100.0000 ug | ORAL_TABLET | Freq: Every day | ORAL | 0 refills | Status: DC

## 2020-03-02 NOTE — Telephone Encounter (Signed)
Spoke to [facility] and informed erx has been sent for 30 day and that the PA for pulmicort was started on Monday.

## 2020-03-02 NOTE — Addendum Note (Signed)
Addended by: Aviva Signs M on: 03/02/2020 03:20 PM   Modules accepted: Orders

## 2020-03-06 MED ORDER — MONTELUKAST SODIUM 10 MG PO TABS: 10.0000 mg | ORAL_TABLET | Freq: Every day | ORAL | 0 refills | Status: DC

## 2020-03-06 MED ORDER — FAMOTIDINE 40 MG PO TABS: 40.0000 mg | ORAL_TABLET | Freq: Every day | ORAL | 0 refills | Status: DC

## 2020-03-06 MED ORDER — LEVOTHYROXINE SODIUM 100 MCG PO TABS: 100.0000 ug | ORAL_TABLET | Freq: Every day | ORAL | 0 refills | Status: DC

## 2020-03-06 MED ORDER — LOSARTAN POTASSIUM 100 MG PO TABS: 100.0000 mg | ORAL_TABLET | Freq: Every day | ORAL | 0 refills | Status: DC

## 2020-03-06 NOTE — Addendum Note (Signed)
Addended by: Aviva Signs M on: 03/06/2020 10:52 AM   Modules accepted: Orders

## 2020-03-06 NOTE — Telephone Encounter (Signed)
    Charlotte from [facility] calling to follow up on refills, please send meds to [facility] as indicated on 3/1

## 2020-03-07 NOTE — Telephone Encounter (Signed)
LVM for [facility] requesting her to run the rx for Pulmicort to see if it was approved (PA).

## 2020-03-09 ENCOUNTER — Telehealth: Payer: Self-pay

## 2020-03-09 NOTE — Telephone Encounter (Signed)
Closing encounter. Not in chart regarding same.

## 2020-03-09 NOTE — Telephone Encounter (Signed)
New message   [place] calling    The claim was submitted and it went through successfully.   No phone call is needed.

## 2020-03-09 NOTE — Telephone Encounter (Signed)
Note from [place]:   New message   [place] calling    The claim was submitted and it went through successfully.   No phone call is needed.

## 2020-03-13 ENCOUNTER — Encounter: Payer: Self-pay | Admitting: Internal Medicine

## 2020-03-13 ENCOUNTER — Ambulatory Visit: Payer: Medicare Other | Admitting: Internal Medicine

## 2020-03-13 ENCOUNTER — Other Ambulatory Visit: Payer: Self-pay

## 2020-03-13 VITALS — BP 134/86 | HR 68 | Temp 97.6°F | Resp 16 | Ht 65.0 in | Wt 162.0 lb

## 2020-03-13 DIAGNOSIS — R7303 Prediabetes: Secondary | ICD-10-CM

## 2020-03-13 DIAGNOSIS — I10 Essential (primary) hypertension: Secondary | ICD-10-CM

## 2020-03-13 DIAGNOSIS — N183 Chronic kidney disease, stage 3 unspecified: Secondary | ICD-10-CM

## 2020-03-13 DIAGNOSIS — E785 Hyperlipidemia, unspecified: Secondary | ICD-10-CM

## 2020-03-13 DIAGNOSIS — E039 Hypothyroidism, unspecified: Secondary | ICD-10-CM | POA: Diagnosis not present

## 2020-03-13 DIAGNOSIS — E781 Pure hyperglyceridemia: Secondary | ICD-10-CM

## 2020-03-13 DIAGNOSIS — Z0000 Encounter for general adult medical examination without abnormal findings: Secondary | ICD-10-CM

## 2020-03-13 LAB — HEPATIC FUNCTION PANEL
ALT: 39 U/L — ABNORMAL HIGH (ref 0–35)
AST: 33 U/L (ref 0–37)
Albumin: 4.2 g/dL (ref 3.5–5.2)
Alkaline Phosphatase: 75 U/L (ref 39–117)
Bilirubin, Direct: 0.1 mg/dL (ref 0.0–0.3)
Total Bilirubin: 0.6 mg/dL (ref 0.2–1.2)
Total Protein: 7 g/dL (ref 6.0–8.3)

## 2020-03-13 LAB — CBC WITH DIFFERENTIAL/PLATELET
Basophils Absolute: 0.1 10*3/uL (ref 0.0–0.1)
Basophils Relative: 1.1 % (ref 0.0–3.0)
Eosinophils Absolute: 0.2 10*3/uL (ref 0.0–0.7)
Eosinophils Relative: 3.1 % (ref 0.0–5.0)
HCT: 43.1 % (ref 36.0–46.0)
Hemoglobin: 14.4 g/dL (ref 12.0–15.0)
Lymphocytes Relative: 14.6 % (ref 12.0–46.0)
Lymphs Abs: 1 10*3/uL (ref 0.7–4.0)
MCHC: 33.3 g/dL (ref 30.0–36.0)
MCV: 87.7 fl (ref 78.0–100.0)
Monocytes Absolute: 0.5 10*3/uL (ref 0.1–1.0)
Monocytes Relative: 7 % (ref 3.0–12.0)
Neutro Abs: 4.8 10*3/uL (ref 1.4–7.7)
Neutrophils Relative %: 74.2 % (ref 43.0–77.0)
Platelets: 337 10*3/uL (ref 150.0–400.0)
RBC: 4.92 Mil/uL (ref 3.87–5.11)
RDW: 14 % (ref 11.5–15.5)
WBC: 6.5 10*3/uL (ref 4.0–10.5)

## 2020-03-13 LAB — BASIC METABOLIC PANEL
BUN: 20 mg/dL (ref 6–23)
CO2: 27 mEq/L (ref 19–32)
Calcium: 9.5 mg/dL (ref 8.4–10.5)
Chloride: 103 mEq/L (ref 96–112)
Creatinine, Ser: 0.91 mg/dL (ref 0.40–1.20)
GFR: 60.05 mL/min (ref >60.00)
Glucose, Bld: 98 mg/dL (ref 70–99)
Potassium: 3.5 mEq/L (ref 3.5–5.1)
Sodium: 138 mEq/L (ref 135–145)

## 2020-03-13 LAB — LIPID PANEL
Cholesterol: 196 mg/dL (ref 0–200)
HDL: 49.9 mg/dL (ref >39.00)
LDL Cholesterol: 119 mg/dL — ABNORMAL HIGH (ref 0–99)
NonHDL: 146.15
Total CHOL/HDL Ratio: 4
Triglycerides: 136 mg/dL (ref 0.0–149.0)
VLDL: 27.2 mg/dL (ref 0.0–40.0)

## 2020-03-13 LAB — TSH: TSH: 0.55 u[IU]/mL (ref 0.35–4.50)

## 2020-03-13 LAB — HEMOGLOBIN A1C: Hgb A1c MFr Bld: 6.1 % (ref 4.6–6.5)

## 2020-03-13 MED ORDER — ATORVASTATIN CALCIUM 10 MG PO TABS: 10.0000 mg | ORAL_TABLET | Freq: Every day | ORAL | 1 refills | Status: DC

## 2020-03-13 NOTE — Patient Instructions (Signed)
Health Maintenance, Female Adopting a healthy lifestyle and getting preventive care are important in promoting health and wellness. Ask your health care provider about:  The right schedule for you to have regular tests and exams.  Things you can do on your own to prevent diseases and keep yourself healthy. What should I know about diet, weight, and exercise? Eat a healthy diet   Eat a diet that includes plenty of vegetables, fruits, low-fat dairy products, and lean protein.  Do not eat a lot of foods that are high in solid fats, added sugars, or sodium. Maintain a healthy weight Body mass index (BMI) is used to identify weight problems. It estimates body fat based on height and weight. Your health care provider can help determine your BMI and help you achieve or maintain a healthy weight. Get regular exercise Get regular exercise. This is one of the most important things you can do for your health. Most adults should:  Exercise for at least 150 minutes each week. The exercise should increase your heart rate and make you sweat (moderate-intensity exercise).  Do strengthening exercises at least twice a week. This is in addition to the moderate-intensity exercise.  Spend less time sitting. Even light physical activity can be beneficial. Watch cholesterol and blood lipids Have your blood tested for lipids and cholesterol at 77 years of age, then have this test every 5 years. Have your cholesterol levels checked more often if:  Your lipid or cholesterol levels are high.  You are older than 77 years of age.  You are at high risk for heart disease. What should I know about cancer screening? Depending on your health history and family history, you may need to have cancer screening at various ages. This may include screening for:  Breast cancer.  Cervical cancer.  Colorectal cancer.  Skin cancer.  Lung cancer. What should I know about heart disease, diabetes, and high blood  pressure? Blood pressure and heart disease  High blood pressure causes heart disease and increases the risk of stroke. This is more likely to develop in people who have high blood pressure readings, are of African descent, or are overweight.  Have your blood pressure checked: ? Every 3-5 years if you are 18-39 years of age. ? Every year if you are 40 years old or older. Diabetes Have regular diabetes screenings. This checks your fasting blood sugar level. Have the screening done:  Once every three years after age 40 if you are at a normal weight and have a low risk for diabetes.  More often and at a younger age if you are overweight or have a high risk for diabetes. What should I know about preventing infection? Hepatitis B If you have a higher risk for hepatitis B, you should be screened for this virus. Talk with your health care provider to find out if you are at risk for hepatitis B infection. Hepatitis C Testing is recommended for:  Everyone born from 1945 through 1965.  Anyone with known risk factors for hepatitis C. Sexually transmitted infections (STIs)  Get screened for STIs, including gonorrhea and chlamydia, if: ? You are sexually active and are younger than 77 years of age. ? You are older than 77 years of age and your health care provider tells you that you are at risk for this type of infection. ? Your sexual activity has changed since you were last screened, and you are at increased risk for chlamydia or gonorrhea. Ask your health care provider if   you are at risk.  Ask your health care provider about whether you are at high risk for HIV. Your health care provider may recommend a prescription medicine to help prevent HIV infection. If you choose to take medicine to prevent HIV, you should first get tested for HIV. You should then be tested every 3 months for as long as you are taking the medicine. Pregnancy  If you are about to stop having your period (premenopausal) and  you may become pregnant, seek counseling before you get pregnant.  Take 400 to 800 micrograms (mcg) of folic acid every day if you become pregnant.  Ask for birth control (contraception) if you want to prevent pregnancy. Osteoporosis and menopause Osteoporosis is a disease in which the bones lose minerals and strength with aging. This can result in bone fractures. If you are 65 years old or older, or if you are at risk for osteoporosis and fractures, ask your health care provider if you should:  Be screened for bone loss.  Take a calcium or vitamin D supplement to lower your risk of fractures.  Be given hormone replacement therapy (HRT) to treat symptoms of menopause. Follow these instructions at home: Lifestyle  Do not use any products that contain nicotine or tobacco, such as cigarettes, e-cigarettes, and chewing tobacco. If you need help quitting, ask your health care provider.  Do not use street drugs.  Do not share needles.  Ask your health care provider for help if you need support or information about quitting drugs. Alcohol use  Do not drink alcohol if: ? Your health care provider tells you not to drink. ? You are pregnant, may be pregnant, or are planning to become pregnant.  If you drink alcohol: ? Limit how much you use to 0-1 drink a day. ? Limit intake if you are breastfeeding.  Be aware of how much alcohol is in your drink. In the [place] one drink equals one 12 oz bottle of beer (355 mL), one 5 oz glass of wine (148 mL), or one 1 oz glass of hard liquor (44 mL). General instructions  Schedule regular health, dental, and eye exams.  Stay current with your vaccines.  Tell your health care provider if: ? You often feel depressed. ? You have ever been abused or do not feel safe at home. Summary  Adopting a healthy lifestyle and getting preventive care are important in promoting health and wellness.  Follow your health care provider's instructions about healthy  diet, exercising, and getting tested or screened for diseases.  Follow your health care provider's instructions on monitoring your cholesterol and blood pressure. This information is not intended to replace advice given to you by your health care provider. Make sure you discuss any questions you have with your health care provider. Document Revised: 12/09/2018 Document Reviewed: 12/09/2018 Elsevier Patient Education  2020 Elsevier Inc.  

## 2020-03-13 NOTE — Progress Notes (Signed)
Subjective:  Patient ID: Natalie Shannon, female    DOB: 11/28/1941  Age: 79 y.o. MRN: [id number]  CC: Annual Exam, Hypertension, Hypothyroidism, and Hyperlipidemia  This visit occurred during the SARS-CoV-2 public health emergency.  Safety protocols were in place, including screening questions prior to the visit, additional usage of staff PPE, and extensive cleaning of exam room while observing appropriate contact time as indicated for disinfecting solutions.    HPI Natalie Shannon presents for a CPX.  She feels well today and offers no complaints.  She is active and denies any recent episodes of palpitations, chest pain, shortness of breath, edema, or fatigue.  Outpatient Medications Prior to Visit  Medication Sig Dispense Refill  . albuterol (VENTOLIN HFA) 108 (90 Base) MCG/ACT inhaler Inhale 2 puffs into the lungs every 6 (six) hours as needed for wheezing or shortness of breath. 1 Inhaler 3  . Alum Hydroxide-Mag Carbonate (GAVISCON EXTRA STRENGTH) 160-105 MG CHEW Chew 1 tablet by mouth 3 (three) times daily with meals as needed. 90 tablet 11  . budesonide (PULMICORT FLEXHALER) 180 MCG/ACT inhaler Inhale 2 puffs into the lungs 2 (two) times daily. 6 each 1  . famotidine (PEPCID) 40 MG tablet Take 1 tablet (40 mg total) by mouth daily. 30 tablet 0  . losartan (COZAAR) 100 MG tablet Take 1 tablet (100 mg total) by mouth daily. 30 tablet 0  . metroNIDAZOLE (METROCREAM) 0.75 % cream Apply topically 2 (two) times daily. 45 g 2  . montelukast (SINGULAIR) 10 MG tablet Take 1 tablet (10 mg total) by mouth at bedtime. 30 tablet 0  . pregabalin (LYRICA) 25 MG capsule Take 1 capsule (25 mg total) by mouth 3 (three) times daily. 90 capsule 5  . levothyroxine (SYNTHROID) 100 MCG tablet Take 1 tablet (100 mcg total) by mouth daily. 30 tablet 0  . Pitavastatin Calcium (LIVALO) 1 MG TABS Take 1 tablet (1 mg total) by mouth daily. 90 tablet 0   No facility-administered medications prior to visit.     ROS Review of Systems  Constitutional: Negative for diaphoresis, fatigue and unexpected weight change.  HENT: Negative.   Eyes: Negative.   Respiratory: Negative for cough, chest tightness, shortness of breath and wheezing.   Cardiovascular: Negative for chest pain, palpitations and leg swelling.  Gastrointestinal: Negative for abdominal pain, constipation, diarrhea, nausea and vomiting.  Endocrine: Negative.  Negative for cold intolerance and heat intolerance.  Genitourinary: Negative.  Negative for difficulty urinating, dysuria and urgency.  Musculoskeletal: Negative for arthralgias and myalgias.  Skin: Negative.  Negative for color change and pallor.  Neurological: Negative for dizziness, weakness, light-headedness and numbness.  Hematological: Negative for adenopathy. Does not bruise/bleed easily.  Psychiatric/Behavioral: Negative.     Objective:  BP 134/86 (BP Location: Left Arm, Patient Position: Sitting, Cuff Size: Normal)   Pulse 68   Temp 97.6 F (36.4 C) (Oral)   Resp 16   Ht 5\' 5"  (1.651 m)   Wt 162 lb (73.5 kg)   SpO2 97%   BMI 26.96 kg/m   BP Readings from Last 3 Encounters:  03/13/20 134/86  03/02/19 130/88  12/14/18 140/80    Wt Readings from Last 3 Encounters:  03/13/20 162 lb (73.5 kg)  03/02/19 168 lb (76.2 kg)  12/14/18 169 lb 4 oz (76.8 kg)    Physical Exam Vitals reviewed.  Constitutional:      Appearance: Normal appearance.  HENT:     Nose: Nose normal.     Mouth/Throat:  Mouth: Mucous membranes are moist.  Eyes:     General: No scleral icterus.    Conjunctiva/sclera: Conjunctivae normal.  Cardiovascular:     Rate and Rhythm: Normal rate and regular rhythm.     Heart sounds: No murmur.  Pulmonary:     Effort: Pulmonary effort is normal.     Breath sounds: No stridor. No wheezing, rhonchi or rales.  Abdominal:     General: Abdomen is flat. Bowel sounds are normal. There is no distension.     Palpations: Abdomen is soft. There  is no hepatomegaly, splenomegaly or mass.     Tenderness: There is no abdominal tenderness.     Hernia: No hernia is present.  Musculoskeletal:        General: Normal range of motion.     Cervical back: Normal range of motion.     Right lower leg: No edema.     Left lower leg: No edema.  Lymphadenopathy:     Cervical: No cervical adenopathy.  Skin:    General: Skin is warm and dry.     Coloration: Skin is not pale.  Neurological:     General: No focal deficit present.     Mental Status: She is alert and oriented to person, place, and time.  Psychiatric:        Mood and Affect: Mood normal.        Behavior: Behavior normal.     Lab Results  Component Value Date   WBC 6.5 03/13/2020   HGB 14.4 03/13/2020   HCT 43.1 03/13/2020   PLT 337.0 03/13/2020   GLUCOSE 98 03/13/2020   CHOL 196 03/13/2020   TRIG 136.0 03/13/2020   HDL 49.90 03/13/2020   LDLDIRECT 173.0 12/07/2018   LDLCALC 119 (H) 03/13/2020   ALT 39 (H) 03/13/2020   AST 33 03/13/2020   NA 138 03/13/2020   K 3.5 03/13/2020   CL 103 03/13/2020   CREATININE 0.91 03/13/2020   BUN 20 03/13/2020   CO2 27 03/13/2020   TSH 0.55 03/13/2020   INR 1.01 05/15/2012   HGBA1C 6.1 03/13/2020    No results found.  Assessment & Plan:   Natalie Shannon was seen today for annual exam, hypertension, hypothyroidism and hyperlipidemia.  Diagnoses and all orders for this visit:  Essential hypertension- Her blood pressure is adequately well controlled.  Electrolytes are normal.  Renal function is stable. -     CBC with Differential/Platelet -     Basic metabolic panel  CRI (chronic renal insufficiency), stage 3 (moderate)- Her renal function is stable.  She agrees to avoid nephrotoxic agents.  Will continue to maintain control of her blood pressure. -     CBC with Differential/Platelet -     Basic metabolic panel -     Urinalysis, Routine w reflex microscopic  Health care maintenance  Hyperlipidemia LDL goal <130- She has achieved  her LDL goal and is doing well on the statin. -     Lipid panel -     Hepatic function panel -     TSH -     atorvastatin (LIPITOR) 10 MG tablet; Take 1 tablet (10 mg total) by mouth daily.  Prediabetes- Her A1c is at 6.1%.  Medical therapy is not indicated. -     Hemoglobin A1c  Pure hyperglyceridemia- Her triglycerides are normal now. -     Lipid panel  Routine general medical examination at a health care facility- Exam completed, labs reviewed, vaccines reviewed and updated, she is  not willing to be screened for colon cancer, breast cancer screening is up-to-date, cervical cancer screening is not indicated, patient education was given.  Acquired hypothyroidism- Her TSH is in the normal range.  She clinically appears euthyroid.  Will continue the current dose of levothyroxine. -     levothyroxine (SYNTHROID) 100 MCG tablet; Take 1 tablet (100 mcg total) by mouth daily.   I have discontinued Lanaysia A. Fitzgibbon's Livalo. I am also having her start on atorvastatin. Additionally, I am having her maintain her Alum Hydroxide-Mag Carbonate, albuterol, pregabalin, Pulmicort Flexhaler, metroNIDAZOLE, famotidine, losartan, montelukast, and levothyroxine.  Meds ordered this encounter  Medications  . atorvastatin (LIPITOR) 10 MG tablet    Sig: Take 1 tablet (10 mg total) by mouth daily.    Dispense:  90 tablet    Refill:  1  . levothyroxine (SYNTHROID) 100 MCG tablet    Sig: Take 1 tablet (100 mcg total) by mouth daily.    Dispense:  90 tablet    Refill:  1     Follow-up: Return in about 6 months (around 09/13/2020).  Scarlette Calico, MD

## 2020-03-14 ENCOUNTER — Encounter: Payer: Self-pay | Admitting: Internal Medicine

## 2020-03-14 LAB — URINALYSIS, ROUTINE W REFLEX MICROSCOPIC
Bilirubin Urine: NEGATIVE
Hgb urine dipstick: NEGATIVE
Ketones, ur: 15 — AB
Leukocytes,Ua: NEGATIVE
Nitrite: NEGATIVE
RBC / HPF: NONE SEEN (ref 0–?)
Specific Gravity, Urine: 1.025 (ref 1.000–1.030)
Total Protein, Urine: NEGATIVE
Urine Glucose: NEGATIVE
Urobilinogen, UA: 0.2 (ref 0.0–1.0)
pH: 5.5 (ref 5.0–8.0)

## 2020-03-14 MED ORDER — LEVOTHYROXINE SODIUM 100 MCG PO TABS: 100.0000 ug | ORAL_TABLET | Freq: Every day | ORAL | 1 refills | Status: DC

## 2020-03-20 ENCOUNTER — Telehealth: Payer: Self-pay | Admitting: Internal Medicine

## 2020-03-20 DIAGNOSIS — E785 Hyperlipidemia, unspecified: Secondary | ICD-10-CM

## 2020-03-20 MED ORDER — ATORVASTATIN CALCIUM 10 MG PO TABS: 10.0000 mg | ORAL_TABLET | Freq: Every day | ORAL | 1 refills | Status: DC

## 2020-03-20 NOTE — Telephone Encounter (Signed)
    Please send atorvastatin (LIPITOR) 10 MG tablet to [facility] [place], [place] - [address]

## 2020-04-10 ENCOUNTER — Telehealth: Payer: Self-pay

## 2020-04-10 DIAGNOSIS — J4520 Mild intermittent asthma, uncomplicated: Secondary | ICD-10-CM

## 2020-04-10 DIAGNOSIS — E039 Hypothyroidism, unspecified: Secondary | ICD-10-CM

## 2020-04-10 DIAGNOSIS — I10 Essential (primary) hypertension: Secondary | ICD-10-CM

## 2020-04-10 MED ORDER — MONTELUKAST SODIUM 10 MG PO TABS: 10.0000 mg | ORAL_TABLET | Freq: Every day | ORAL | 1 refills | Status: DC

## 2020-04-10 MED ORDER — LEVOTHYROXINE SODIUM 100 MCG PO TABS: 100.0000 ug | ORAL_TABLET | Freq: Every day | ORAL | 1 refills | Status: DC

## 2020-04-10 MED ORDER — LOSARTAN POTASSIUM 100 MG PO TABS: 100.0000 mg | ORAL_TABLET | Freq: Every day | ORAL | 5 refills | Status: DC

## 2020-04-10 NOTE — Telephone Encounter (Signed)
New Message:   1.Medication Requested: levothyroxine (SYNTHROID) 100 MCG tablet montelukast (SINGULAIR) 10 MG tablet 2. Pharmacy (Name, Street, [place]): [facility] [place], [place] - [address] 3. On Med List: Yes  4. Last Visit with PCP: 3.15/21  5. Next visit date with PCP: None   Agent: Please be advised that RX refills may take up to 3 business days. We ask that you follow-up with your pharmacy.

## 2020-04-10 NOTE — Telephone Encounter (Signed)
1.Medication Requested:losartan (COZAAR) 100 MG tablet  2. Pharmacy (Name, Street, [place]):[facility] [place], [place] - [address]  3. On Med List: Yes   4. Last Visit with PCP: 3.15.21   5. Next visit date with PCP: no appt is made at this time    Agent: Please be advised that RX refills may take up to 3 business days. We ask that you follow-up with your pharmacy.

## 2020-04-10 NOTE — Telephone Encounter (Signed)
Losartan sent erx as requested.

## 2020-04-10 NOTE — Telephone Encounter (Signed)
Reviewed chart pt is up-to-date sent refills to [facility]  

## 2020-04-10 NOTE — Addendum Note (Signed)
Addended by: Aviva Signs M on: 04/10/2020 01:50 PM   Modules accepted: Orders

## 2020-04-19 DIAGNOSIS — L578 Other skin changes due to chronic exposure to nonionizing radiation: Secondary | ICD-10-CM | POA: Diagnosis not present

## 2020-04-19 DIAGNOSIS — L309 Dermatitis, unspecified: Secondary | ICD-10-CM | POA: Diagnosis not present

## 2020-04-19 DIAGNOSIS — L719 Rosacea, unspecified: Secondary | ICD-10-CM | POA: Diagnosis not present

## 2020-04-19 DIAGNOSIS — L821 Other seborrheic keratosis: Secondary | ICD-10-CM | POA: Diagnosis not present

## 2020-04-19 DIAGNOSIS — D225 Melanocytic nevi of trunk: Secondary | ICD-10-CM | POA: Diagnosis not present

## 2020-04-19 DIAGNOSIS — L814 Other melanin hyperpigmentation: Secondary | ICD-10-CM | POA: Diagnosis not present

## 2020-04-19 DIAGNOSIS — Z808 Family history of malignant neoplasm of other organs or systems: Secondary | ICD-10-CM | POA: Diagnosis not present

## 2020-07-24 ENCOUNTER — Telehealth: Payer: Self-pay | Admitting: Internal Medicine

## 2020-07-24 DIAGNOSIS — K219 Gastro-esophageal reflux disease without esophagitis: Secondary | ICD-10-CM

## 2020-07-24 MED ORDER — FAMOTIDINE 40 MG PO TABS: 40.0000 mg | ORAL_TABLET | Freq: Every day | ORAL | 5 refills | Status: DC

## 2020-07-24 NOTE — Telephone Encounter (Signed)
Erx has been sent. 

## 2020-07-24 NOTE — Telephone Encounter (Signed)
.    1.Medication Requested:famotidine (PEPCID) 40 MG tablet  2. Pharmacy (Name, Street, [place]):[facility] [place], [place] - [address]  3. On Med List: yes  4. Last Visit with PCP: 03/13/20  5. Next visit date with PCP: n/a   Agent: Please be advised that RX refills may take up to 3 business days. We ask that you follow-up with your pharmacy.

## 2020-08-24 ENCOUNTER — Telehealth: Payer: Self-pay | Admitting: Internal Medicine

## 2020-08-24 NOTE — Telephone Encounter (Signed)
    Natalie Shannon from [facility] requesting med refill on budesonide [facility]) 180 MCG/ACT inhaler be sent to [facility] - [place], [place] - [address]

## 2020-08-24 NOTE — Telephone Encounter (Signed)
Pt is due for an appt. If possible, can you call her and schedule?

## 2020-08-25 NOTE — Telephone Encounter (Signed)
LVM to in form patient she is due for 6 month FU, &If she could call the office back and set that up.

## 2020-08-29 ENCOUNTER — Other Ambulatory Visit: Payer: Self-pay | Admitting: Internal Medicine

## 2020-08-29 DIAGNOSIS — J4520 Mild intermittent asthma, uncomplicated: Secondary | ICD-10-CM

## 2020-08-29 MED ORDER — PULMICORT FLEXHALER 180 MCG/ACT IN AEPB: 2.0000 | INHALATION_SPRAY | Freq: Two times a day (BID) | RESPIRATORY_TRACT | 0 refills | Status: DC

## 2020-08-29 NOTE — Telephone Encounter (Signed)
New Message:    Pt has been scheduled for an appt on 09/13/20 and the pharmacy would like to know if the pt can have a one time refill until she can be seen. She states she is getting ready to run out. Please advise.

## 2020-09-05 ENCOUNTER — Ambulatory Visit: Payer: Medicare PPO | Admitting: Internal Medicine

## 2020-09-11 ENCOUNTER — Telehealth: Payer: Self-pay | Admitting: Internal Medicine

## 2020-09-11 NOTE — Telephone Encounter (Signed)
LVM for pt to RTN my call to schedule AWV with [facility] 

## 2020-09-13 ENCOUNTER — Encounter: Payer: Self-pay | Admitting: Internal Medicine

## 2020-09-13 ENCOUNTER — Ambulatory Visit: Payer: Medicare PPO | Admitting: Internal Medicine

## 2020-09-13 ENCOUNTER — Other Ambulatory Visit: Payer: Self-pay

## 2020-09-13 VITALS — BP 142/82 | HR 69 | Temp 97.9°F | Resp 16 | Ht 65.0 in | Wt 168.0 lb

## 2020-09-13 DIAGNOSIS — E785 Hyperlipidemia, unspecified: Secondary | ICD-10-CM

## 2020-09-13 DIAGNOSIS — R7303 Prediabetes: Secondary | ICD-10-CM

## 2020-09-13 DIAGNOSIS — Z23 Encounter for immunization: Secondary | ICD-10-CM | POA: Diagnosis not present

## 2020-09-13 DIAGNOSIS — N1832 Chronic kidney disease, stage 3b: Secondary | ICD-10-CM

## 2020-09-13 DIAGNOSIS — S0340XA Sprain of jaw, unspecified side, initial encounter: Secondary | ICD-10-CM | POA: Diagnosis not present

## 2020-09-13 DIAGNOSIS — J4520 Mild intermittent asthma, uncomplicated: Secondary | ICD-10-CM | POA: Diagnosis not present

## 2020-09-13 DIAGNOSIS — I10 Essential (primary) hypertension: Secondary | ICD-10-CM

## 2020-09-13 DIAGNOSIS — E039 Hypothyroidism, unspecified: Secondary | ICD-10-CM

## 2020-09-13 MED ORDER — TIZANIDINE HCL 2 MG PO CAPS: 2.0000 mg | ORAL_CAPSULE | Freq: Three times a day (TID) | ORAL | 1 refills | Status: DC | PRN

## 2020-09-13 NOTE — Patient Instructions (Signed)

## 2020-09-13 NOTE — Progress Notes (Signed)
Subjective:  Patient ID: Natalie Shannon, female    DOB: [birth year]  Age: 79 y.o. MRN: [id number]  CC: Hypothyroidism, Hypertension, Hyperlipidemia, and Asthma  This visit occurred during the SARS-CoV-2 public health emergency.  Safety protocols were in place, including screening questions prior to the visit, additional usage of staff PPE, and extensive cleaning of exam room while observing appropriate contact time as indicated for disinfecting solutions.    HPI Natalie Shannon presents for f/up -  1.  She has been grinding her teeth recently and complains of TMJ pain.  It is worse on the left than the right.  There is so much discomfort that she has trouble opening her mouth.  She has not gotten much symptom relief with Advil.  2.  She tells me her asthma has been well controlled.  She continues to use the ICS, albuterol, and montelukast.  She has had no episodes of cough, wheezing, shortness of breath, or hemoptysis.  3.  She thinks her thyroid level is in the acceptable range.  She denies any recent episodes of weight changes, constipation, changes in her sleep, or lower extremity edema.  Outpatient Medications Prior to Visit  Medication Sig Dispense Refill  . albuterol (VENTOLIN HFA) 108 (90 Base) MCG/ACT inhaler Inhale 2 puffs into the lungs every 6 (six) hours as needed for wheezing or shortness of breath. 1 Inhaler 3  . Alum Hydroxide-Mag Carbonate (GAVISCON EXTRA STRENGTH) 160-105 MG CHEW Chew 1 tablet by mouth 3 (three) times daily with meals as needed. 90 tablet 11  . budesonide (PULMICORT FLEXHALER) 180 MCG/ACT inhaler Inhale 2 puffs into the lungs 2 (two) times daily. 6 each 0  . famotidine (PEPCID) 40 MG tablet Take 1 tablet (40 mg total) by mouth daily. 30 tablet 5  . losartan (COZAAR) 100 MG tablet Take 1 tablet (100 mg total) by mouth daily. 30 tablet 5  . metroNIDAZOLE (METROCREAM) 0.75 % cream Apply topically 2 (two) times daily. 45 g 2  . pregabalin (LYRICA) 25 MG  capsule Take 1 capsule (25 mg total) by mouth 3 (three) times daily. 90 capsule 5  . atorvastatin (LIPITOR) 10 MG tablet Take 1 tablet (10 mg total) by mouth daily. 90 tablet 1  . levothyroxine (SYNTHROID) 100 MCG tablet Take 1 tablet (100 mcg total) by mouth daily. 90 tablet 1  . montelukast (SINGULAIR) 10 MG tablet Take 1 tablet (10 mg total) by mouth at bedtime. 90 tablet 1   No facility-administered medications prior to visit.    ROS Review of Systems  Constitutional: Negative.  Negative for appetite change, diaphoresis, fatigue and unexpected weight change.  HENT: Positive for dental problem. Negative for trouble swallowing and voice change.   Eyes: Negative for visual disturbance.  Respiratory: Negative for cough, chest tightness, shortness of breath and wheezing.   Cardiovascular: Negative for chest pain, palpitations and leg swelling.  Gastrointestinal: Negative for abdominal pain, constipation, diarrhea, nausea and vomiting.  Endocrine: Negative.  Negative for cold intolerance and heat intolerance.  Genitourinary: Negative.  Negative for difficulty urinating.  Musculoskeletal: Negative.  Negative for arthralgias, back pain, myalgias and neck pain.  Skin: Negative.  Negative for color change, pallor and rash.  Neurological: Negative.  Negative for dizziness, weakness, light-headedness, numbness and headaches.  Hematological: Negative for adenopathy. Does not bruise/bleed easily.  Psychiatric/Behavioral: Negative.     Objective:  BP (!) 142/82   Pulse 69   Temp 97.9 F (36.6 C) (Oral) Comment: declined due to TMJ issues today  Resp 16   Ht 5\' 5"  (1.651 m)   Wt 168 lb (76.2 kg)   SpO2 96%   BMI 27.96 kg/m   BP Readings from Last 3 Encounters:  09/13/20 (!) 142/82  03/13/20 134/86  03/02/19 130/88    Wt Readings from Last 3 Encounters:  09/13/20 168 lb (76.2 kg)  03/13/20 162 lb (73.5 kg)  03/02/19 168 lb (76.2 kg)    Physical Exam Vitals reviewed.   Constitutional:      Appearance: Normal appearance.  HENT:     Nose: Nose normal.     Mouth/Throat:     Mouth: Mucous membranes are moist.  Eyes:     General: No scleral icterus.    Conjunctiva/sclera: Conjunctivae normal.  Cardiovascular:     Rate and Rhythm: Normal rate and regular rhythm.     Heart sounds: No murmur heard.   Pulmonary:     Effort: Pulmonary effort is normal.     Breath sounds: No stridor. No wheezing, rhonchi or rales.  Abdominal:     General: Abdomen is flat.     Palpations: There is no mass.     Tenderness: There is no abdominal tenderness. There is no guarding.  Musculoskeletal:        General: Normal range of motion.     Cervical back: Neck supple.     Right lower leg: No edema.     Left lower leg: No edema.  Lymphadenopathy:     Cervical: No cervical adenopathy.  Skin:    General: Skin is warm and dry.     Coloration: Skin is not pale.  Neurological:     General: No focal deficit present.     Mental Status: She is alert.  Psychiatric:        Mood and Affect: Mood normal.        Behavior: Behavior normal.     Lab Results  Component Value Date   WBC 6.6 09/13/2020   HGB 15.2 09/13/2020   HCT 44.7 09/13/2020   PLT 353 09/13/2020   GLUCOSE 98 09/13/2020   CHOL 196 03/13/2020   TRIG 136.0 03/13/2020   HDL 49.90 03/13/2020   LDLDIRECT 173.0 12/07/2018   LDLCALC 119 (H) 03/13/2020   ALT 39 (H) 03/13/2020   AST 33 03/13/2020   NA 141 09/13/2020   K 4.5 09/13/2020   CL 105 09/13/2020   CREATININE 1.16 (H) 09/13/2020   BUN 22 09/13/2020   CO2 27 09/13/2020   TSH 1.25 09/13/2020   INR 1.01 05/15/2012   HGBA1C 6.3 (H) 09/13/2020    No results found.  Assessment & Plan:   Hazely was seen today for hypothyroidism, hypertension, hyperlipidemia and asthma.  Diagnoses and all orders for this visit:  Essential hypertension- Her blood pressure is adequately well controlled.  Electrolytes are normal. -     CBC with Differential/Platelet;  Future -     CBC with Differential/Platelet  Acquired hypothyroidism- Her TSH is in the normal range.  She will stay on the current dose of levothyroxine. -     TSH; Future -     TSH -     levothyroxine (SYNTHROID) 100 MCG tablet; Take 1 tablet (100 mcg total) by mouth daily.  Prediabetes- Her A1c is at 6.3%.  Medical therapy is not yet indicated. -     BASIC METABOLIC PANEL WITH GFR; Future -     Hemoglobin A1c; Future -     Hemoglobin A1c -  BASIC METABOLIC PANEL WITH GFR  Flu vaccine need -     Flu Vaccine QUAD High Dose(Fluad)  TMJ (sprain of temporomandibular joint), initial encounter- I recommended that she try muscle relaxer for this. -     tizanidine (ZANAFLEX) 2 MG capsule; Take 1 capsule (2 mg total) by mouth 3 (three) times daily as needed for muscle spasms.  Hyperlipidemia LDL goal <130- She has achieved her LDL goal is doing well on the statin. -     atorvastatin (LIPITOR) 10 MG tablet; Take 1 tablet (10 mg total) by mouth daily.  Asthma, mild intermittent, well-controlled- Will continue the current regimen of montelukast, SABA prn, and an ICS. -     montelukast (SINGULAIR) 10 MG tablet; Take 1 tablet (10 mg total) by mouth at bedtime.  Stage 3b chronic kidney disease- Her blood pressure is adequately well controlled.  She was encouraged to avoid NSAIDs.  Other orders -     Extra Urine Specimen   I am having Tekesha A. Fudala start on tizanidine. I am also having her maintain her Alum Hydroxide-Mag Carbonate, albuterol, pregabalin, metroNIDAZOLE, losartan, famotidine, Pulmicort Flexhaler, atorvastatin, levothyroxine, and montelukast.  Meds ordered this encounter  Medications  . tizanidine (ZANAFLEX) 2 MG capsule    Sig: Take 1 capsule (2 mg total) by mouth 3 (three) times daily as needed for muscle spasms.    Dispense:  90 capsule    Refill:  1  . atorvastatin (LIPITOR) 10 MG tablet    Sig: Take 1 tablet (10 mg total) by mouth daily.    Dispense:  90 tablet     Refill:  1  . levothyroxine (SYNTHROID) 100 MCG tablet    Sig: Take 1 tablet (100 mcg total) by mouth daily.    Dispense:  90 tablet    Refill:  1  . montelukast (SINGULAIR) 10 MG tablet    Sig: Take 1 tablet (10 mg total) by mouth at bedtime.    Dispense:  90 tablet    Refill:  1    This prescription was filled on 05/17/2019. Any refills authorized will be placed on file.   I spent 50 minutes in preparing to see the patient by review of recent labs, imaging and procedures, obtaining and reviewing separately obtained history, communicating with the patient and family or caregiver, ordering medications, tests or procedures, and documenting clinical information in the EHR including the differential Dx, treatment, and any further evaluation and other management of 1. Essential hypertension 2. Acquired hypothyroidism 3. Prediabetes 4. TMJ (sprain of temporomandibular joint), initial encounter 5. Hyperlipidemia LDL goal <130 6. Asthma, mild intermittent, well-controlled 7. Stage 3b chronic kidney disease     Follow-up: Return in about 6 months (around 03/13/2021).  Scarlette Calico, MD

## 2020-09-14 ENCOUNTER — Encounter: Payer: Self-pay | Admitting: Internal Medicine

## 2020-09-14 LAB — CBC WITH DIFFERENTIAL/PLATELET
Absolute Monocytes: 469 cells/uL (ref 200–950)
Basophils Absolute: 59 cells/uL (ref 0–200)
Basophils Relative: 0.9 %
Eosinophils Absolute: 310 cells/uL (ref 15–500)
Eosinophils Relative: 4.7 %
HCT: 44.7 % (ref 35.0–45.0)
Hemoglobin: 15.2 g/dL (ref 11.7–15.5)
Lymphs Abs: 964 cells/uL (ref 850–3900)
MCH: 30.1 pg (ref 27.0–33.0)
MCHC: 34 g/dL (ref 32.0–36.0)
MCV: 88.5 fL (ref 80.0–100.0)
MPV: 9.8 fL (ref 7.5–12.5)
Monocytes Relative: 7.1 %
Neutro Abs: 4798 cells/uL (ref 1500–7800)
Neutrophils Relative %: 72.7 %
Platelets: 353 10*3/uL (ref 140–400)
RBC: 5.05 10*6/uL (ref 3.80–5.10)
RDW: 13.3 % (ref 11.0–15.0)
Total Lymphocyte: 14.6 %
WBC: 6.6 10*3/uL (ref 3.8–10.8)

## 2020-09-14 LAB — BASIC METABOLIC PANEL WITH GFR
BUN/Creatinine Ratio: 19 (calc) (ref 6–22)
BUN: 22 mg/dL (ref 7–25)
CO2: 27 mmol/L (ref 20–32)
Calcium: 9.7 mg/dL (ref 8.6–10.4)
Chloride: 105 mmol/L (ref 98–110)
Creat: 1.16 mg/dL — ABNORMAL HIGH (ref 0.60–0.93)
GFR, Est African American: 53 mL/min/{1.73_m2} — ABNORMAL LOW (ref >=60)
GFR, Est Non African American: 46 mL/min/{1.73_m2} — ABNORMAL LOW (ref >=60)
Glucose, Bld: 98 mg/dL (ref 65–99)
Potassium: 4.5 mmol/L (ref 3.5–5.3)
Sodium: 141 mmol/L (ref 135–146)

## 2020-09-14 LAB — HEMOGLOBIN A1C
Hgb A1c MFr Bld: 6.3 % of total Hgb — ABNORMAL HIGH (ref <5.7)
Mean Plasma Glucose: 134 (calc)
eAG (mmol/L): 7.4 (calc)

## 2020-09-14 LAB — EXTRA URINE SPECIMEN

## 2020-09-14 LAB — TSH: TSH: 1.25 mIU/L (ref 0.40–4.50)

## 2020-09-14 MED ORDER — LEVOTHYROXINE SODIUM 100 MCG PO TABS: 100.0000 ug | ORAL_TABLET | Freq: Every day | ORAL | 1 refills | Status: DC

## 2020-09-14 MED ORDER — ATORVASTATIN CALCIUM 10 MG PO TABS: 10.0000 mg | ORAL_TABLET | Freq: Every day | ORAL | 1 refills | Status: DC

## 2020-09-14 MED ORDER — MONTELUKAST SODIUM 10 MG PO TABS: 10.0000 mg | ORAL_TABLET | Freq: Every day | ORAL | 1 refills | Status: DC

## 2020-10-03 ENCOUNTER — Ambulatory Visit (INDEPENDENT_AMBULATORY_CARE_PROVIDER_SITE_OTHER): Payer: Medicare PPO

## 2020-10-03 DIAGNOSIS — Z0000 Encounter for general adult medical examination without abnormal findings: Secondary | ICD-10-CM | POA: Diagnosis not present

## 2020-10-03 NOTE — Progress Notes (Signed)
I connected with Natalie Shannon today by telephone and verified that I am speaking with the correct person using two identifiers. Location patient: home Location provider: work Persons participating in the virtual visit: Natalie Shannon and Natalie Abu, LPN.   I discussed the limitations, risks, security and privacy concerns of performing an evaluation and management service by telephone and the availability of in person appointments. I also discussed with the patient that there may be a patient responsible charge related to this service. The patient expressed understanding and verbally consented to this telephonic visit.    Interactive audio and video telecommunications were attempted between this provider and patient, however failed, due to patient having technical difficulties OR patient did not have access to video capability.  We continued and completed visit with audio only.  Some vital signs may be absent or patient reported.   Time Spent with patient on telephone encounter: 40 minutes  Subjective:   Natalie Shannon is a 79 y.o. female who presents for Medicare Annual (Subsequent) preventive examination.  Review of Systems    No ROS. Medicare Wellness Visit. Cardiac Risk Factors include: advanced age (>41men, >36 women);dyslipidemia;family history of premature cardiovascular disease;hypertension     Objective:    There were no vitals filed for this visit. There is no height or weight on file to calculate BMI.  Advanced Directives 10/03/2020 12/04/2017 11/01/2016 11/01/2015 10/31/2015 05/11/2015 05/16/2012  Does Patient Have a Medical Advance Directive? Yes Yes Yes Yes Yes Yes Patient has advance directive, copy not in chart  Type of Advance Directive - - [place];Living will [place];Living will [place];Living will [place];Living will -  Does patient want to make changes to medical advance  directive? No - Patient declined Yes (MAU/Ambulatory/Procedural Areas - Information given) No - Patient declined No - Patient declined No - Patient declined - -  Copy of [place] in Chart? - - Yes Yes Yes - -    Current Medications (verified) Outpatient Encounter Medications as of 10/03/2020  Medication Sig   albuterol (VENTOLIN HFA) 108 (90 Base) MCG/ACT inhaler Inhale 2 puffs into the lungs every 6 (six) hours as needed for wheezing or shortness of breath.   Alum Hydroxide-Mag Carbonate (GAVISCON EXTRA STRENGTH) 160-105 MG CHEW Chew 1 tablet by mouth 3 (three) times daily with meals as needed.   atorvastatin (LIPITOR) 10 MG tablet Take 1 tablet (10 mg total) by mouth daily.   budesonide (PULMICORT FLEXHALER) 180 MCG/ACT inhaler Inhale 2 puffs into the lungs 2 (two) times daily.   famotidine (PEPCID) 40 MG tablet Take 1 tablet (40 mg total) by mouth daily.   levothyroxine (SYNTHROID) 100 MCG tablet Take 1 tablet (100 mcg total) by mouth daily.   losartan (COZAAR) 100 MG tablet Take 1 tablet (100 mg total) by mouth daily.   metroNIDAZOLE (METROCREAM) 0.75 % cream Apply topically 2 (two) times daily.   montelukast (SINGULAIR) 10 MG tablet Take 1 tablet (10 mg total) by mouth at bedtime.   pregabalin (LYRICA) 25 MG capsule Take 1 capsule (25 mg total) by mouth 3 (three) times daily.   tizanidine (ZANAFLEX) 2 MG capsule Take 1 capsule (2 mg total) by mouth 3 (three) times daily as needed for muscle spasms.   No facility-administered encounter medications on file as of 10/03/2020.    Allergies (verified) Latex, Povidone-iodine, Simvastatin, Sulfonamide derivatives, Azithromycin, and Doxycycline   History: Past Medical History:  Diagnosis Date  Asthma    GERD (gastroesophageal reflux disease)    History of vertebral fracture 2013   Hyperlipidemia    Hypertension    Pneumothorax, traumatic 1993   rib fracture x4 (thrown from horse)   Urticaria    ?  solar induced   Past Surgical History:  Procedure Laterality Date   COLONOSCOPY     Neg X3, benign polyps ;due 2019, Dr Sharlett Iles   DILATION AND CURETTAGE OF UTERUS  02/2007    Dr Radene Knee   TONSILLECTOMY     Family History  Problem Relation Age of Onset   Asthma Father    Diabetes Mother    Stroke Mother 27       post carotid endarterectomy   Hypertension Mother    Atrial fibrillation Brother    Kidney disease Brother        idiopathic   Stroke Maternal Grandfather        in 61s   Heart attack Maternal Uncle        after 40   Breast cancer Maternal Aunt    Social History   Socioeconomic History   Marital status: Divorced    Spouse name: Not on file   Number of children: Not on file   Years of education: Not on file   Highest education level: Not on file  Occupational History   Not on file  Tobacco Use   Smoking status: Former Smoker    Quit date: 12/31/1967    Years since quitting: 52.7   Smokeless tobacco: Former Systems developer   Tobacco comment: smoked 1965-1969, up 1/2 ppd  Vaping Use   Vaping Use: Never used  Substance and Sexual Activity   Alcohol use: No   Drug use: No   Sexual activity: Not Currently  Other Topics Concern   Not on file  Social History Narrative   Not on file   Social Determinants of Health   Financial Resource Strain: Low Risk    Difficulty of Paying Living Expenses: Not hard at all  Food Insecurity: No Food Insecurity   Worried About Charity fundraiser in the Last Year: Never true   [place] in the Last Year: Never true  Transportation Needs: No Transportation Needs   Lack of Transportation (Medical): No   Lack of Transportation (Non-Medical): No  Physical Activity:    Days of Exercise per Week: Not on file   Minutes of Exercise per Session: Not on file  Stress: No Stress Concern Present   Feeling of Stress : Not at all  Social Connections:    Frequency of Communication with Friends and Family:  Not on file   Frequency of Social Gatherings with Friends and Family: Not on file   Attends Religious Services: Not on file   Active Member of Clubs or Organizations: Not on file   Attends Archivist Meetings: Not on file   Marital Status: Not on file    Tobacco Counseling Counseling given: Not Answered Comment: smoked 1965-1969, up 1/2 ppd   Clinical Intake:  Pre-visit preparation completed: Yes  Pain : No/denies pain     Nutritional Risks: None Diabetes: No  How often do you need to have someone help you when you read instructions, pamphlets, or other written materials from your doctor or pharmacy?: 1 - Never What is the last grade level you completed in school?: HSG  Diabetic? no  Interpreter Needed?: No  Information entered by :: Robie Mcniel N. [place] Guitar, LPN   Activities  of Daily Living In your present state of health, do you have any difficulty performing the following activities: 10/03/2020  Hearing? N  Vision? N  Difficulty concentrating or making decisions? N  Walking or climbing stairs? N  Dressing or bathing? N  Doing errands, shopping? N  Preparing Food and eating ? N  Using the Toilet? N  In the past six months, have you accidently leaked urine? Y  Do you have problems with loss of bowel control? N  Managing your Medications? N  Managing your Finances? N  Housekeeping or managing your Housekeeping? N  Some recent data might be hidden    Patient Care Team: Janith Lima, MD as PCP - General (Internal Medicine) Darleen Crocker, MD as Consulting Physician (Ophthalmology)  Indicate any recent Medical Services you may have received from other than [facility] providers in the past year (date may be approximate).     Assessment:   This is a routine wellness examination for [facility].  Hearing/Vision screen No exam data present  Dietary issues and exercise activities discussed: Current Exercise Habits: The patient does not participate in regular  exercise at present, Exercise limited by: respiratory conditions(s)  Goals   None    Depression Screen PHQ 2/9 Scores 10/03/2020 03/13/2020 12/08/2018 12/07/2018 12/04/2017 11/01/2016 11/01/2015  PHQ - 2 Score 0 0 0 0 0 0 0  PHQ- 9 Score - - - 0 - - -    Fall Risk Fall Risk  10/03/2020 03/13/2020 12/08/2018 12/07/2018 12/04/2017  Falls in the past year? 0 0 0 0 No  Number falls in past yr: 0 0 - - -  Injury with Fall? 0 0 - - -  Risk for fall due to : No Fall Risks - - - Impaired balance/gait  Follow up Falls evaluation completed Falls evaluation completed - - -    Any stairs in or around the home? No  If so, are there any without handrails? No  Home free of loose throw rugs in walkways, pet beds, electrical cords, etc? Yes  Adequate lighting in your home to reduce risk of falls? Yes   ASSISTIVE DEVICES UTILIZED TO PREVENT FALLS:  Life alert? Yes  Use of a cane, walker or w/c? Yes  Grab bars in the bathroom? Yes  Shower chair or bench in shower? Yes  Elevated toilet seat or a handicapped toilet? Yes   TIMED UP AND GO:  Was the test performed? No .  Length of time to ambulate 10 feet: 0 sec.   Gait steady and fast without use of assistive device  Cognitive Function: not indicated.        Immunizations Immunization History  Administered Date(s) Administered   Fluad Quad(high Dose 65+) 09/13/2020   H1N1 01/12/2009   Influenza Split 10/15/2011, 09/29/2012, 10/07/2014, 09/30/2015   Influenza Whole 10/24/2009, 09/21/2010   Influenza, High Dose Seasonal PF 09/15/2017, 09/18/2018, 12/14/2019, 12/14/2019   Influenza,inj,Quad PF,6+ Mos 10/21/2013   Influenza-Unspecified 09/29/2016   Moderna SARS-COVID-2 Vaccination 01/31/2020, 02/27/2020   Pneumococcal Conjugate-13 10/30/2016   Pneumococcal Polysaccharide-23 12/30/2000, 09/28/2008, 03/13/2017   Td 05/12/1994, 12/30/2002   Tdap 08/10/2012   Zoster 06/24/2008   Zoster Recombinat (Shingrix) 12/14/2019, 04/05/2020      TDAP status: Up to date Flu Vaccine status: Up to date Pneumococcal vaccine status: Up to date Covid-19 vaccine status: Information provided on how to obtain vaccines.   Qualifies for Shingles Vaccine? Yes   Zostavax completed Yes   Shingrix Completed?: Yes  Screening Tests Health Maintenance  Topic Date Due   TETANUS/TDAP  08/10/2022   INFLUENZA VACCINE  Completed   DEXA SCAN  Completed   COVID-19 Vaccine  Completed   Hepatitis C Screening  Completed   PNA vac Low Risk Adult  Completed    Health Maintenance  There are no preventive care reminders to display for this patient.  Colorectal cancer screening: No longer required.  Mammogram status: Completed 09/01/2019. Repeat every year  Lung Cancer Screening: (Low Dose CT Chest recommended if Age 80-80 years, 30 pack-year currently smoking OR have quit w/in 15years.) does not qualify.   Lung Cancer Screening Referral: no  Additional Screening:  Hepatitis C Screening: does qualify; Completed yes  Vision Screening: Recommended annual ophthalmology exams for early detection of glaucoma and other disorders of the eye. Is the patient up to date with their annual eye exam?  Yes  Who is the provider or what is the name of the office in which the patient attends annual eye exams? Darleen Crocker, MD If pt is not established with a provider, would they like to be referred to a provider to establish care? No .   Dental Screening: Recommended annual dental exams for proper oral hygiene  Community Resource Referral / Chronic Care Management: CRR required this visit?  No   CCM required this visit?  No      Plan:     I have personally reviewed and noted the following in the patients chart:    Medical and social history  Use of alcohol, tobacco or illicit drugs   Current medications and supplements  Functional ability and status  Nutritional status  Physical activity  Advanced directives  List of other  physicians  Hospitalizations, surgeries, and ER visits in previous 12 months  Vitals  Screenings to include cognitive, depression, and falls  Referrals and appointments  In addition, I have reviewed and discussed with patient certain preventive protocols, quality metrics, and best practice recommendations. A written personalized care plan for preventive services as well as general preventive health recommendations were provided to patient.     Sheral Flow, LPN   14/08/7025   Nurse Notes:  Patient is cogitatively intact. There were no vitals filed for this visit. There is no height or weight on file to calculate BMI. Patient stated that she has no issues with gait or balance; does not use any assistive devices.

## 2020-10-03 NOTE — Patient Instructions (Addendum)
Natalie Shannon , Thank you for taking time to come for your Medicare Wellness Visit. I appreciate your ongoing commitment to your health goals. Please review the following plan we discussed and let me know if I can assist you in the future.   Screening recommendations/referrals: Colonoscopy: not a candidate for colon screening Mammogram: 09/01/2019 Bone Density: 10/12/2014 Recommended yearly ophthalmology/optometry visit for glaucoma screening and checkup Recommended yearly dental visit for hygiene and checkup  Vaccinations: Influenza vaccine: 09/13/2020 Pneumococcal vaccine: completed Tdap vaccine: 08/10/2012 Shingles vaccine: completed   Covid-19: completed  Advanced directives: Please bring a copy of your health care power of attorney and living will to the office at your convenience.  Conditions/risks identified: Yes. Reviewed health maintenance screenings with patient today and relevant education, vaccines, and/or referrals were provided. Continue doing brain stimulating activities (puzzles, reading, adult coloring books, staying active) to keep memory sharp. Continue to eat heart healthy diet (full of fruits, vegetables, whole grains, lean protein, water--limit salt, fat, and sugar intake) and increase physical activity as tolerated.  Next appointment: Please schedule your next Medicare Wellness Visit with your Nurse Health Advisor in 1 year by calling [phone].  Preventive Care 4 Years and Older, Female Preventive care refers to lifestyle choices and visits with your health care provider that can promote health and wellness. What does preventive care include?  A yearly physical exam. This is also called an annual well check.  Dental exams once or twice a year.  Routine eye exams. Ask your health care provider how often you should have your eyes checked.  Personal lifestyle choices, including:  Daily care of your teeth and gums.  Regular physical activity.  Eating a  healthy diet.  Avoiding tobacco and drug use.  Limiting alcohol use.  Practicing safe sex.  Taking low-dose aspirin every day.  Taking vitamin and mineral supplements as recommended by your health care provider. What happens during an annual well check? The services and screenings done by your health care provider during your annual well check will depend on your age, overall health, lifestyle risk factors, and family history of disease. Counseling  Your health care provider may ask you questions about your:  Alcohol use.  Tobacco use.  Drug use.  Emotional well-being.  Home and relationship well-being.  Sexual activity.  Eating habits.  History of falls.  Memory and ability to understand (cognition).  Work and work Statistician.  Reproductive health. Screening  You may have the following tests or measurements:  Height, weight, and BMI.  Blood pressure.  Lipid and cholesterol levels. These may be checked every 5 years, or more frequently if you are over 5 years old.  Skin check.  Lung cancer screening. You may have this screening every year starting at age 51 if you have a 30-pack-year history of smoking and currently smoke or have quit within the past 15 years.  Fecal occult blood test (FOBT) of the stool. You may have this test every year starting at age 61.  Flexible sigmoidoscopy or colonoscopy. You may have a sigmoidoscopy every 5 years or a colonoscopy every 10 years starting at age 21.  Hepatitis C blood test.  Hepatitis B blood test.  Sexually transmitted disease (STD) testing.  Diabetes screening. This is done by checking your blood sugar (glucose) after you have not eaten for a while (fasting). You may have this done every 1-3 years.  Bone density scan. This is done to screen for osteoporosis. You may have this done starting at age  65.  Mammogram. This may be done every 1-2 years. Talk to your health care provider about how often you should  have regular mammograms. Talk with your health care provider about your test results, treatment options, and if necessary, the need for more tests. Vaccines  Your health care provider may recommend certain vaccines, such as:  Influenza vaccine. This is recommended every year.  Tetanus, diphtheria, and acellular pertussis (Tdap, Td) vaccine. You may need a Td booster every 10 years.  Zoster vaccine. You may need this after age 20.  Pneumococcal 13-valent conjugate (PCV13) vaccine. One dose is recommended after age 72.  Pneumococcal polysaccharide (PPSV23) vaccine. One dose is recommended after age 23. Talk to your health care provider about which screenings and vaccines you need and how often you need them. This information is not intended to replace advice given to you by your health care provider. Make sure you discuss any questions you have with your health care provider. Document Released: 01/12/2016 Document Revised: 09/04/2016 Document Reviewed: 10/17/2015 Elsevier Interactive Patient Education  2017 [place] Prevention in the Home Falls can cause injuries. They can happen to people of all ages. There are many things you can do to make your home safe and to help prevent falls. What can I do on the outside of my home?  Regularly fix the edges of walkways and driveways and fix any cracks.  Remove anything that might make you trip as you walk through a door, such as a raised step or threshold.  Trim any bushes or trees on the path to your home.  Use bright outdoor lighting.  Clear any walking paths of anything that might make someone trip, such as rocks or tools.  Regularly check to see if handrails are loose or broken. Make sure that both sides of any steps have handrails.  Any raised decks and porches should have guardrails on the edges.  Have any leaves, snow, or ice cleared regularly.  Use sand or salt on walking paths during winter.  Clean up any spills in  your garage right away. This includes oil or grease spills. What can I do in the bathroom?  Use night lights.  Install grab bars by the toilet and in the tub and shower. Do not use towel bars as grab bars.  Use non-skid mats or decals in the tub or shower.  If you need to sit down in the shower, use a plastic, non-slip stool.  Keep the floor dry. Clean up any water that spills on the floor as soon as it happens.  Remove soap buildup in the tub or shower regularly.  Attach bath mats securely with double-sided non-slip rug tape.  Do not have throw rugs and other things on the floor that can make you trip. What can I do in the bedroom?  Use night lights.  Make sure that you have a light by your bed that is easy to reach.  Do not use any sheets or blankets that are too big for your bed. They should not hang down onto the floor.  Have a firm chair that has side arms. You can use this for support while you get dressed.  Do not have throw rugs and other things on the floor that can make you trip. What can I do in the kitchen?  Clean up any spills right away.  Avoid walking on wet floors.  Keep items that you use a lot in easy-to-reach places.  If you need  to reach something above you, use a strong step stool that has a grab bar.  Keep electrical cords out of the way.  Do not use floor polish or wax that makes floors slippery. If you must use wax, use non-skid floor wax.  Do not have throw rugs and other things on the floor that can make you trip. What can I do with my stairs?  Do not leave any items on the stairs.  Make sure that there are handrails on both sides of the stairs and use them. Fix handrails that are broken or loose. Make sure that handrails are as long as the stairways.  Check any carpeting to make sure that it is firmly attached to the stairs. Fix any carpet that is loose or worn.  Avoid having throw rugs at the top or bottom of the stairs. If you do have  throw rugs, attach them to the floor with carpet tape.  Make sure that you have a light switch at the top of the stairs and the bottom of the stairs. If you do not have them, ask someone to add them for you. What else can I do to help prevent falls?  Wear shoes that:  Do not have high heels.  Have rubber bottoms.  Are comfortable and fit you well.  Are closed at the toe. Do not wear sandals.  If you use a stepladder:  Make sure that it is fully opened. Do not climb a closed stepladder.  Make sure that both sides of the stepladder are locked into place.  Ask someone to hold it for you, if possible.  Clearly mark and make sure that you can see:  Any grab bars or handrails.  First and last steps.  Where the edge of each step is.  Use tools that help you move around (mobility aids) if they are needed. These include:  Canes.  Walkers.  Scooters.  Crutches.  Turn on the lights when you go into a dark area. Replace any light bulbs as soon as they burn out.  Set up your furniture so you have a clear path. Avoid moving your furniture around.  If any of your floors are uneven, fix them.  If there are any pets around you, be aware of where they are.  Review your medicines with your doctor. Some medicines can make you feel dizzy. This can increase your chance of falling. Ask your doctor what other things that you can do to help prevent falls. This information is not intended to replace advice given to you by your health care provider. Make sure you discuss any questions you have with your health care provider. Document Released: 10/12/2009 Document Revised: 05/23/2016 Document Reviewed: 01/20/2015 Elsevier Interactive Patient Education  2017 [employer].

## 2020-10-19 ENCOUNTER — Other Ambulatory Visit: Payer: Self-pay | Admitting: Internal Medicine

## 2020-10-19 ENCOUNTER — Telehealth: Payer: Self-pay | Admitting: Internal Medicine

## 2020-10-19 DIAGNOSIS — I10 Essential (primary) hypertension: Secondary | ICD-10-CM

## 2020-10-19 MED ORDER — LOSARTAN POTASSIUM 100 MG PO TABS: 100.0000 mg | ORAL_TABLET | Freq: Every day | ORAL | 1 refills | Status: DC

## 2020-10-19 NOTE — Telephone Encounter (Signed)
   Please send refill for losartan (COZAAR) 100 MG tablet to [facility] [place], [place] - [address]

## 2020-11-16 ENCOUNTER — Telehealth: Payer: Self-pay | Admitting: Internal Medicine

## 2020-11-16 ENCOUNTER — Other Ambulatory Visit: Payer: Self-pay | Admitting: Internal Medicine

## 2020-11-16 DIAGNOSIS — K219 Gastro-esophageal reflux disease without esophagitis: Secondary | ICD-10-CM

## 2020-11-16 DIAGNOSIS — E039 Hypothyroidism, unspecified: Secondary | ICD-10-CM

## 2020-11-16 DIAGNOSIS — E785 Hyperlipidemia, unspecified: Secondary | ICD-10-CM

## 2020-11-16 DIAGNOSIS — J4520 Mild intermittent asthma, uncomplicated: Secondary | ICD-10-CM

## 2020-11-16 DIAGNOSIS — I10 Essential (primary) hypertension: Secondary | ICD-10-CM

## 2020-11-16 MED ORDER — LEVOTHYROXINE SODIUM 100 MCG PO TABS: 100.0000 ug | ORAL_TABLET | Freq: Every day | ORAL | 1 refills | Status: DC

## 2020-11-16 MED ORDER — MONTELUKAST SODIUM 10 MG PO TABS
10.0000 mg | ORAL_TABLET | Freq: Every day | ORAL | 1 refills | Status: DC
Start: 2020-11-16 — End: 2021-05-17

## 2020-11-16 MED ORDER — ATORVASTATIN CALCIUM 10 MG PO TABS: 10.0000 mg | ORAL_TABLET | Freq: Every day | ORAL | 1 refills | Status: DC

## 2020-11-16 MED ORDER — FAMOTIDINE 40 MG PO TABS: 40.0000 mg | ORAL_TABLET | Freq: Every day | ORAL | 1 refills | Status: DC

## 2020-11-16 MED ORDER — LOSARTAN POTASSIUM 100 MG PO TABS: 100.0000 mg | ORAL_TABLET | Freq: Every day | ORAL | 1 refills | Status: DC

## 2020-11-16 NOTE — Telephone Encounter (Signed)
  1.Medication Requested: atorvastatin (LIPITOR) 10 MG tablet famotidine (PEPCID) 40 MG tablet levothyroxine (SYNTHROID) 100 MCG tablet losartan (COZAAR) 100 MG tablet montelukast (SINGULAIR) 10 MG tablet  2. Pharmacy (Name, Street, [place]): [facility], [place]       3. On Med List:yes  4. Last Visit with PCP 09/13/20  5. Next visit date with PCP: n/a   Agent: Please be advised that RX refills may take up to 3 business days. We ask that you follow-up with your pharmacy.

## 2020-11-29 DIAGNOSIS — Z1231 Encounter for screening mammogram for malignant neoplasm of breast: Secondary | ICD-10-CM | POA: Diagnosis not present

## 2021-01-24 ENCOUNTER — Other Ambulatory Visit: Payer: Self-pay | Admitting: Internal Medicine

## 2021-01-24 DIAGNOSIS — L719 Rosacea, unspecified: Secondary | ICD-10-CM

## 2021-01-24 MED ORDER — METRONIDAZOLE 0.75 % EX CREA: TOPICAL_CREAM | Freq: Two times a day (BID) | CUTANEOUS | 2 refills | Status: DC

## 2021-01-31 ENCOUNTER — Telehealth: Payer: Self-pay | Admitting: Internal Medicine

## 2021-01-31 DIAGNOSIS — J4520 Mild intermittent asthma, uncomplicated: Secondary | ICD-10-CM

## 2021-01-31 MED ORDER — PULMICORT FLEXHALER 180 MCG/ACT IN AEPB: 2.0000 | INHALATION_SPRAY | Freq: Two times a day (BID) | RESPIRATORY_TRACT | 0 refills | Status: DC

## 2021-01-31 NOTE — Telephone Encounter (Signed)
Rx sent 

## 2021-01-31 NOTE — Telephone Encounter (Signed)
   Patient refill for budesonide (PULMICORT FLEXHALER) 180 MCG/ACT inhaler Pharmacy [place], [place]

## 2021-03-06 DIAGNOSIS — Z961 Presence of intraocular lens: Secondary | ICD-10-CM | POA: Diagnosis not present

## 2021-03-06 DIAGNOSIS — H43813 Vitreous degeneration, bilateral: Secondary | ICD-10-CM | POA: Diagnosis not present

## 2021-03-06 DIAGNOSIS — H26493 Other secondary cataract, bilateral: Secondary | ICD-10-CM | POA: Diagnosis not present

## 2021-03-06 DIAGNOSIS — H16223 Keratoconjunctivitis sicca, not specified as Sjogren's, bilateral: Secondary | ICD-10-CM | POA: Diagnosis not present

## 2021-03-06 LAB — HM DIABETES EYE EXAM

## 2021-03-14 DIAGNOSIS — Z6828 Body mass index (BMI) 28.0-28.9, adult: Secondary | ICD-10-CM | POA: Diagnosis not present

## 2021-03-14 DIAGNOSIS — Z1211 Encounter for screening for malignant neoplasm of colon: Secondary | ICD-10-CM | POA: Diagnosis not present

## 2021-03-14 DIAGNOSIS — B372 Candidiasis of skin and nail: Secondary | ICD-10-CM | POA: Diagnosis not present

## 2021-03-14 DIAGNOSIS — Z124 Encounter for screening for malignant neoplasm of cervix: Secondary | ICD-10-CM | POA: Diagnosis not present

## 2021-04-04 DIAGNOSIS — B029 Zoster without complications: Secondary | ICD-10-CM | POA: Diagnosis not present

## 2021-05-14 ENCOUNTER — Other Ambulatory Visit: Payer: Self-pay | Admitting: Internal Medicine

## 2021-05-14 DIAGNOSIS — J4520 Mild intermittent asthma, uncomplicated: Secondary | ICD-10-CM

## 2021-05-14 DIAGNOSIS — E785 Hyperlipidemia, unspecified: Secondary | ICD-10-CM

## 2021-05-14 DIAGNOSIS — I10 Essential (primary) hypertension: Secondary | ICD-10-CM

## 2021-05-15 DIAGNOSIS — L304 Erythema intertrigo: Secondary | ICD-10-CM | POA: Diagnosis not present

## 2021-05-17 ENCOUNTER — Ambulatory Visit: Payer: Medicare PPO | Admitting: Internal Medicine

## 2021-05-17 ENCOUNTER — Other Ambulatory Visit: Payer: Self-pay

## 2021-05-17 ENCOUNTER — Encounter: Payer: Self-pay | Admitting: Internal Medicine

## 2021-05-17 ENCOUNTER — Other Ambulatory Visit: Payer: Self-pay | Admitting: Internal Medicine

## 2021-05-17 VITALS — BP 156/86 | HR 70 | Temp 98.3°F | Resp 16 | Ht 65.0 in | Wt 172.0 lb

## 2021-05-17 DIAGNOSIS — R7303 Prediabetes: Secondary | ICD-10-CM | POA: Diagnosis not present

## 2021-05-17 DIAGNOSIS — J4520 Mild intermittent asthma, uncomplicated: Secondary | ICD-10-CM | POA: Diagnosis not present

## 2021-05-17 DIAGNOSIS — E785 Hyperlipidemia, unspecified: Secondary | ICD-10-CM

## 2021-05-17 DIAGNOSIS — E039 Hypothyroidism, unspecified: Secondary | ICD-10-CM

## 2021-05-17 DIAGNOSIS — Z0000 Encounter for general adult medical examination without abnormal findings: Secondary | ICD-10-CM | POA: Diagnosis not present

## 2021-05-17 DIAGNOSIS — K219 Gastro-esophageal reflux disease without esophagitis: Secondary | ICD-10-CM

## 2021-05-17 DIAGNOSIS — E1122 Type 2 diabetes mellitus with diabetic chronic kidney disease: Secondary | ICD-10-CM | POA: Diagnosis not present

## 2021-05-17 DIAGNOSIS — N1832 Type 2 diabetes mellitus with diabetic chronic kidney disease: Secondary | ICD-10-CM | POA: Insufficient documentation

## 2021-05-17 DIAGNOSIS — I10 Essential (primary) hypertension: Secondary | ICD-10-CM | POA: Diagnosis not present

## 2021-05-17 LAB — CBC WITH DIFFERENTIAL/PLATELET
Basophils Absolute: 0.1 10*3/uL (ref 0.0–0.1)
Basophils Relative: 0.9 % (ref 0.0–3.0)
Eosinophils Absolute: 0.3 10*3/uL (ref 0.0–0.7)
Eosinophils Relative: 4.1 % (ref 0.0–5.0)
HCT: 44 % (ref 36.0–46.0)
Hemoglobin: 14.7 g/dL (ref 12.0–15.0)
Lymphocytes Relative: 14.3 % (ref 12.0–46.0)
Lymphs Abs: 1 10*3/uL (ref 0.7–4.0)
MCHC: 33.3 g/dL (ref 30.0–36.0)
MCV: 88.8 fl (ref 78.0–100.0)
Monocytes Absolute: 0.4 10*3/uL (ref 0.1–1.0)
Monocytes Relative: 6 % (ref 3.0–12.0)
Neutro Abs: 4.9 10*3/uL (ref 1.4–7.7)
Neutrophils Relative %: 74.7 % (ref 43.0–77.0)
Platelets: 353 10*3/uL (ref 150.0–400.0)
RBC: 4.96 Mil/uL (ref 3.87–5.11)
RDW: 14.9 % (ref 11.5–15.5)
WBC: 6.6 10*3/uL (ref 4.0–10.5)

## 2021-05-17 LAB — URINALYSIS, ROUTINE W REFLEX MICROSCOPIC
Bilirubin Urine: NEGATIVE
Hgb urine dipstick: NEGATIVE
Ketones, ur: NEGATIVE
Leukocytes,Ua: NEGATIVE
Nitrite: NEGATIVE
RBC / HPF: NONE SEEN (ref 0–?)
Specific Gravity, Urine: 1.025 (ref 1.000–1.030)
Total Protein, Urine: NEGATIVE
Urine Glucose: NEGATIVE
Urobilinogen, UA: 0.2 (ref 0.0–1.0)
pH: 5.5 (ref 5.0–8.0)

## 2021-05-17 LAB — BASIC METABOLIC PANEL
BUN: 20 mg/dL (ref 6–23)
CO2: 28 mEq/L (ref 19–32)
Calcium: 9.6 mg/dL (ref 8.4–10.5)
Chloride: 105 mEq/L (ref 96–112)
Creatinine, Ser: 1.03 mg/dL (ref 0.40–1.20)
GFR: 52.44 mL/min — ABNORMAL LOW (ref >60.00)
Glucose, Bld: 108 mg/dL — ABNORMAL HIGH (ref 70–99)
Potassium: 4.1 mEq/L (ref 3.5–5.1)
Sodium: 142 mEq/L (ref 135–145)

## 2021-05-17 LAB — HEPATIC FUNCTION PANEL
ALT: 29 U/L (ref 0–35)
AST: 25 U/L (ref 0–37)
Albumin: 4.3 g/dL (ref 3.5–5.2)
Alkaline Phosphatase: 85 U/L (ref 39–117)
Bilirubin, Direct: 0.1 mg/dL (ref 0.0–0.3)
Total Bilirubin: 0.5 mg/dL (ref 0.2–1.2)
Total Protein: 6.9 g/dL (ref 6.0–8.3)

## 2021-05-17 LAB — LIPID PANEL
Cholesterol: 171 mg/dL (ref 0–200)
HDL: 48.4 mg/dL (ref >39.00)
LDL Cholesterol: 83 mg/dL (ref 0–99)
NonHDL: 122.31
Total CHOL/HDL Ratio: 4
Triglycerides: 199 mg/dL — ABNORMAL HIGH (ref 0.0–149.0)
VLDL: 39.8 mg/dL (ref 0.0–40.0)

## 2021-05-17 LAB — HEMOGLOBIN A1C: Hgb A1c MFr Bld: 6.6 % — ABNORMAL HIGH (ref 4.6–6.5)

## 2021-05-17 LAB — TSH: TSH: 8.51 u[IU]/mL — ABNORMAL HIGH (ref 0.35–4.50)

## 2021-05-17 MED ORDER — FAMOTIDINE 40 MG PO TABS: 40.0000 mg | ORAL_TABLET | Freq: Every day | ORAL | 1 refills | Status: DC

## 2021-05-17 MED ORDER — PULMICORT FLEXHALER 180 MCG/ACT IN AEPB: 2.0000 | INHALATION_SPRAY | Freq: Two times a day (BID) | RESPIRATORY_TRACT | 1 refills | Status: DC

## 2021-05-17 MED ORDER — ATORVASTATIN CALCIUM 10 MG PO TABS: 10.0000 mg | ORAL_TABLET | Freq: Every day | ORAL | 1 refills | Status: DC

## 2021-05-17 MED ORDER — LOSARTAN POTASSIUM 100 MG PO TABS: 100.0000 mg | ORAL_TABLET | Freq: Every day | ORAL | 1 refills | Status: DC

## 2021-05-17 MED ORDER — DAPAGLIFLOZIN PROPANEDIOL 10 MG PO TABS: 10.0000 mg | ORAL_TABLET | Freq: Every day | ORAL | 1 refills | Status: DC

## 2021-05-17 MED ORDER — LEVOTHYROXINE SODIUM 112 MCG PO TABS: 112.0000 ug | ORAL_TABLET | Freq: Every day | ORAL | 1 refills | Status: DC

## 2021-05-17 NOTE — Progress Notes (Signed)
Subjective:  Patient ID: Natalie Shannon, female    DOB: 10-23-1941  Age: 80 y.o. MRN: [id number]  CC: Annual Exam, Hypothyroidism, and Hypertension  This visit occurred during the SARS-CoV-2 public health emergency.  Safety protocols were in place, including screening questions prior to the visit, additional usage of staff PPE, and extensive cleaning of exam room while observing appropriate contact time as indicated for disinfecting solutions.    HPI MIKEILA BURGEN presents for a CPX and f/up -  She complains of weight gain and fatigue.  She is active and denies any recent episodes of chest pain, shortness of breath, wheezing, diaphoresis, dizziness, or lightheadedness.  Outpatient Medications Prior to Visit  Medication Sig Dispense Refill  . albuterol (VENTOLIN HFA) 108 (90 Base) MCG/ACT inhaler Inhale 2 puffs into the lungs every 6 (six) hours as needed for wheezing or shortness of breath. 1 Inhaler 3  . pregabalin (LYRICA) 25 MG capsule Take 1 capsule (25 mg total) by mouth 3 (three) times daily. 90 capsule 5  . Alum Hydroxide-Mag Carbonate (GAVISCON EXTRA STRENGTH) 160-105 MG CHEW Chew 1 tablet by mouth 3 (three) times daily with meals as needed. 90 tablet 11  . atorvastatin (LIPITOR) 10 MG tablet Take 1 tablet (10 mg total) by mouth daily. 90 tablet 1  . budesonide (PULMICORT FLEXHALER) 180 MCG/ACT inhaler Inhale 2 puffs into the lungs 2 (two) times daily. 6 each 0  . famotidine (PEPCID) 40 MG tablet Take 1 tablet (40 mg total) by mouth daily. 90 tablet 1  . levothyroxine (SYNTHROID) 100 MCG tablet Take 1 tablet (100 mcg total) by mouth daily. 90 tablet 1  . losartan (COZAAR) 100 MG tablet Take 1 tablet (100 mg total) by mouth daily. 90 tablet 1  . metroNIDAZOLE (METROCREAM) 0.75 % cream Apply topically 2 (two) times daily. 45 g 2  . montelukast (SINGULAIR) 10 MG tablet Take 1 tablet (10 mg total) by mouth at bedtime. 90 tablet 1  . tizanidine (ZANAFLEX) 2 MG capsule Take 1  capsule (2 mg total) by mouth 3 (three) times daily as needed for muscle spasms. 90 capsule 1   No facility-administered medications prior to visit.    ROS Review of Systems  Constitutional: Positive for fatigue and unexpected weight change. Negative for chills and diaphoresis.  HENT: Negative.   Eyes: Negative.   Respiratory: Negative for cough, chest tightness, shortness of breath and wheezing.   Cardiovascular: Negative for chest pain, palpitations and leg swelling.  Gastrointestinal: Negative for abdominal pain, constipation, diarrhea and vomiting.  Endocrine: Negative.  Negative for cold intolerance and heat intolerance.  Genitourinary: Negative.  Negative for difficulty urinating and dysuria.  Musculoskeletal: Negative.   Skin: Negative.   Neurological: Negative.  Negative for dizziness, weakness, light-headedness, numbness and headaches.  Hematological: Negative for adenopathy. Does not bruise/bleed easily.  Psychiatric/Behavioral: Negative.     Objective:  BP (!) 156/86 (BP Location: Left Arm, Patient Position: Sitting, Cuff Size: Large)   Pulse 70   Temp 98.3 F (36.8 C) (Oral)   Resp 16   Ht 5\' 5"  (1.651 m)   Wt 172 lb (78 kg)   SpO2 97%   BMI 28.62 kg/m   BP Readings from Last 3 Encounters:  05/17/21 (!) 156/86  09/13/20 (!) 142/82  03/13/20 134/86    Wt Readings from Last 3 Encounters:  05/17/21 172 lb (78 kg)  09/13/20 168 lb (76.2 kg)  03/13/20 162 lb (73.5 kg)    Physical Exam Vitals reviewed.  Constitutional:      Appearance: Normal appearance.  HENT:     Nose: Nose normal.     Mouth/Throat:     Mouth: Mucous membranes are moist.  Eyes:     General: No scleral icterus.    Conjunctiva/sclera: Conjunctivae normal.  Cardiovascular:     Rate and Rhythm: Normal rate and regular rhythm.     Heart sounds: No murmur heard.   Pulmonary:     Effort: Pulmonary effort is normal.     Breath sounds: No stridor. No wheezing, rhonchi or rales.   Abdominal:     General: Abdomen is flat.     Palpations: There is no mass.     Tenderness: There is no abdominal tenderness. There is no guarding.  Musculoskeletal:        General: Normal range of motion.     Cervical back: Neck supple.     Right lower leg: No edema.     Left lower leg: No edema.  Lymphadenopathy:     Cervical: No cervical adenopathy.  Skin:    General: Skin is warm and dry.  Neurological:     General: No focal deficit present.     Mental Status: She is alert.  Psychiatric:        Mood and Affect: Mood normal.        Behavior: Behavior normal.     Lab Results  Component Value Date   WBC 6.6 05/17/2021   HGB 14.7 05/17/2021   HCT 44.0 05/17/2021   PLT 353.0 05/17/2021   GLUCOSE 108 (H) 05/17/2021   CHOL 171 05/17/2021   TRIG 199.0 (H) 05/17/2021   HDL 48.40 05/17/2021   LDLDIRECT 173.0 12/07/2018   LDLCALC 83 05/17/2021   ALT 29 05/17/2021   AST 25 05/17/2021   NA 142 05/17/2021   K 4.1 05/17/2021   CL 105 05/17/2021   CREATININE 1.03 05/17/2021   BUN 20 05/17/2021   CO2 28 05/17/2021   TSH 8.51 (H) 05/17/2021   INR 1.01 05/15/2012   HGBA1C 6.6 (H) 05/17/2021    No results found.  Assessment & Plan:   Emmah was seen today for annual exam, hypothyroidism and hypertension.  Diagnoses and all orders for this visit:  Stage 3b chronic kidney disease- I recommended that she start taking dapagliflozin to help reduce the risk of complications from chronic kidney disease. -     Basic metabolic panel; Future -     Urinalysis, Routine w reflex microscopic; Future -     Urinalysis, Routine w reflex microscopic -     Basic metabolic panel -     dapagliflozin propanediol (FARXIGA) 10 MG TABS tablet; Take 1 tablet (10 mg total) by mouth daily before breakfast.  Asthma, mild intermittent, well-controlled- Her asthma is well controlled. -     budesonide (PULMICORT FLEXHALER) 180 MCG/ACT inhaler; Inhale 2 puffs into the lungs 2 (two) times daily. -      CBC with Differential/Platelet; Future -     CBC with Differential/Platelet  Hyperlipidemia LDL goal <130- She has achieved her LDL goal and is doing well on the statin. -     atorvastatin (LIPITOR) 10 MG tablet; Take 1 tablet (10 mg total) by mouth daily. -     Lipid panel; Future -     Hepatic function panel; Future -     Hepatic function panel -     Lipid panel  Gastroesophageal reflux disease without esophagitis -     famotidine (PEPCID)  40 MG tablet; Take 1 tablet (40 mg total) by mouth daily. -     CBC with Differential/Platelet; Future -     CBC with Differential/Platelet  Acquired hypothyroidism- Her TSH is elevated and she is symptomatic.  I recommended that she increase her dose of levothyroxine. -     TSH; Future -     TSH -     levothyroxine (SYNTHROID) 112 MCG tablet; Take 1 tablet (112 mcg total) by mouth daily.  Essential hypertension- Her blood pressure is adequately well controlled. -     losartan (COZAAR) 100 MG tablet; Take 1 tablet (100 mg total) by mouth daily. -     CBC with Differential/Platelet; Future -     Urinalysis, Routine w reflex microscopic; Future -     Hepatic function panel; Future -     Hepatic function panel -     Urinalysis, Routine w reflex microscopic -     CBC with Differential/Platelet  Prediabetes- Her A1c is up to 6.6%. -     Basic metabolic panel; Future -     Hemoglobin A1c; Future -     Hemoglobin A1c -     Basic metabolic panel  Routine general medical examination at a health care facility- Exam completed, labs reviewed, vaccines are up-to-date, no cancer screenings are indicated, patient education was given.  Type 2 diabetes mellitus with stage 3b chronic kidney disease, without long-term current use of insulin (HCC) -     dapagliflozin propanediol (FARXIGA) 10 MG TABS tablet; Take 1 tablet (10 mg total) by mouth daily before breakfast.   I have discontinued [facility]. H[facility]ell's Alum Hydroxide-Mag Carbonate, tizanidine,  levothyroxine, and metroNIDAZOLE. I am also having her start on levothyroxine and dapagliflozin propanediol. Additionally, I am having her maintain her albuterol, pregabalin, Pulmicort Flexhaler, atorvastatin, famotidine, and losartan.  Meds ordered this encounter  Medications  . budesonide (PULMICORT FLEXHALER) 180 MCG/ACT inhaler    Sig: Inhale 2 puffs into the lungs 2 (two) times daily.    Dispense:  6 each    Refill:  1  . atorvastatin (LIPITOR) 10 MG tablet    Sig: Take 1 tablet (10 mg total) by mouth daily.    Dispense:  90 tablet    Refill:  1  . famotidine (PEPCID) 40 MG tablet    Sig: Take 1 tablet (40 mg total) by mouth daily.    Dispense:  90 tablet    Refill:  1  . losartan (COZAAR) 100 MG tablet    Sig: Take 1 tablet (100 mg total) by mouth daily.    Dispense:  90 tablet    Refill:  1  . levothyroxine (SYNTHROID) 112 MCG tablet    Sig: Take 1 tablet (112 mcg total) by mouth daily.    Dispense:  90 tablet    Refill:  1  . dapagliflozin propanediol (FARXIGA) 10 MG TABS tablet    Sig: Take 1 tablet (10 mg total) by mouth daily before breakfast.    Dispense:  90 tablet    Refill:  1     Follow-up: Return in about 6 months (around 11/17/2021).  Scarlette Calico, MD

## 2021-05-17 NOTE — Patient Instructions (Signed)
Health Maintenance, Female Adopting a healthy lifestyle and getting preventive care are important in promoting health and wellness. Ask your health care provider about:  The right schedule for you to have regular tests and exams.  Things you can do on your own to prevent diseases and keep yourself healthy. What should I know about diet, weight, and exercise? Eat a healthy diet  Eat a diet that includes plenty of vegetables, fruits, low-fat dairy products, and lean protein.  Do not eat a lot of foods that are high in solid fats, added sugars, or sodium.   Maintain a healthy weight Body mass index (BMI) is used to identify weight problems. It estimates body fat based on height and weight. Your health care provider can help determine your BMI and help you achieve or maintain a healthy weight. Get regular exercise Get regular exercise. This is one of the most important things you can do for your health. Most adults should:  Exercise for at least 150 minutes each week. The exercise should increase your heart rate and make you sweat (moderate-intensity exercise).  Do strengthening exercises at least twice a week. This is in addition to the moderate-intensity exercise.  Spend less time sitting. Even light physical activity can be beneficial. Watch cholesterol and blood lipids Have your blood tested for lipids and cholesterol at 78 years of age, then have this test every 5 years. Have your cholesterol levels checked more often if:  Your lipid or cholesterol levels are high.  You are older than 78 years of age.  You are at high risk for heart disease. What should I know about cancer screening? Depending on your health history and family history, you may need to have cancer screening at various ages. This may include screening for:  Breast cancer.  Cervical cancer.  Colorectal cancer.  Skin cancer.  Lung cancer. What should I know about heart disease, diabetes, and high blood  pressure? Blood pressure and heart disease  High blood pressure causes heart disease and increases the risk of stroke. This is more likely to develop in people who have high blood pressure readings, are of African descent, or are overweight.  Have your blood pressure checked: ? Every 3-5 years if you are 18-39 years of age. ? Every year if you are 40 years old or older. Diabetes Have regular diabetes screenings. This checks your fasting blood sugar level. Have the screening done:  Once every three years after age 40 if you are at a normal weight and have a low risk for diabetes.  More often and at a younger age if you are overweight or have a high risk for diabetes. What should I know about preventing infection? Hepatitis B If you have a higher risk for hepatitis B, you should be screened for this virus. Talk with your health care provider to find out if you are at risk for hepatitis B infection. Hepatitis C Testing is recommended for:  Everyone born from 1945 through 1965.  Anyone with known risk factors for hepatitis C. Sexually transmitted infections (STIs)  Get screened for STIs, including gonorrhea and chlamydia, if: ? You are sexually active and are younger than 78 years of age. ? You are older than 78 years of age and your health care provider tells you that you are at risk for this type of infection. ? Your sexual activity has changed since you were last screened, and you are at increased risk for chlamydia or gonorrhea. Ask your health care provider   if you are at risk.  Ask your health care provider about whether you are at high risk for HIV. Your health care provider may recommend a prescription medicine to help prevent HIV infection. If you choose to take medicine to prevent HIV, you should first get tested for HIV. You should then be tested every 3 months for as long as you are taking the medicine. Pregnancy  If you are about to stop having your period (premenopausal) and  you may become pregnant, seek counseling before you get pregnant.  Take 400 to 800 micrograms (mcg) of folic acid every day if you become pregnant.  Ask for birth control (contraception) if you want to prevent pregnancy. Osteoporosis and menopause Osteoporosis is a disease in which the bones lose minerals and strength with aging. This can result in bone fractures. If you are 65 years old or older, or if you are at risk for osteoporosis and fractures, ask your health care provider if you should:  Be screened for bone loss.  Take a calcium or vitamin D supplement to lower your risk of fractures.  Be given hormone replacement therapy (HRT) to treat symptoms of menopause. Follow these instructions at home: Lifestyle  Do not use any products that contain nicotine or tobacco, such as cigarettes, e-cigarettes, and chewing tobacco. If you need help quitting, ask your health care provider.  Do not use street drugs.  Do not share needles.  Ask your health care provider for help if you need support or information about quitting drugs. Alcohol use  Do not drink alcohol if: ? Your health care provider tells you not to drink. ? You are pregnant, may be pregnant, or are planning to become pregnant.  If you drink alcohol: ? Limit how much you use to 0-1 drink a day. ? Limit intake if you are breastfeeding.  Be aware of how much alcohol is in your drink. In the [place] one drink equals one 12 oz bottle of beer (355 mL), one 5 oz glass of wine (148 mL), or one 1 oz glass of hard liquor (44 mL). General instructions  Schedule regular health, dental, and eye exams.  Stay current with your vaccines.  Tell your health care provider if: ? You often feel depressed. ? You have ever been abused or do not feel safe at home. Summary  Adopting a healthy lifestyle and getting preventive care are important in promoting health and wellness.  Follow your health care provider's instructions about healthy  diet, exercising, and getting tested or screened for diseases.  Follow your health care provider's instructions on monitoring your cholesterol and blood pressure. This information is not intended to replace advice given to you by your health care provider. Make sure you discuss any questions you have with your health care provider. Document Revised: 12/09/2018 Document Reviewed: 12/09/2018 Elsevier Patient Education  2021 [employer] Inc.  

## 2021-05-18 ENCOUNTER — Encounter: Payer: Self-pay | Admitting: Internal Medicine

## 2021-05-18 ENCOUNTER — Telehealth: Payer: Self-pay | Admitting: Internal Medicine

## 2021-05-18 NOTE — Telephone Encounter (Signed)
The pts labs show that her GFR has been low multiple times in the past. Please advise on what you would like me to inform the pt.

## 2021-05-18 NOTE — Telephone Encounter (Signed)
A letter has been sent 

## 2021-05-18 NOTE — Telephone Encounter (Signed)
Patient saw on her paper work that she has kidney disease and she wants someone to talk to her about that as well as if she should get the medication that the pharmacy told her about. Patient states medication is very expensive

## 2021-05-24 NOTE — Telephone Encounter (Signed)
Pt would like a to schedule a follow up to discuss in further detail. She has quite a few questions and concerns that she would like to go over with you.   Is it ok to schedule in a buffer?    She is available to come on M, W, Th. Morning or Afternoon.

## 2021-05-24 NOTE — Telephone Encounter (Signed)
Patient called again and is requesting a call back. She can be reached at [phone]. Please advise

## 2021-05-25 NOTE — Telephone Encounter (Signed)
Attempted to call pt to schedule. Her VM is not set up to leave a message.

## 2021-06-04 ENCOUNTER — Other Ambulatory Visit: Payer: Self-pay | Admitting: Internal Medicine

## 2021-06-04 DIAGNOSIS — E1122 Type 2 diabetes mellitus with diabetic chronic kidney disease: Secondary | ICD-10-CM

## 2021-06-04 DIAGNOSIS — N1832 Chronic kidney disease, stage 3b: Secondary | ICD-10-CM

## 2021-06-04 MED ORDER — KERENDIA 10 MG PO TABS: 1.0000 | ORAL_TABLET | Freq: Every day | ORAL | 0 refills | Status: DC

## 2021-06-05 ENCOUNTER — Telehealth: Payer: Self-pay

## 2021-06-05 NOTE — Telephone Encounter (Signed)
Key: [id number]

## 2021-06-06 NOTE — Telephone Encounter (Signed)
Approved 12/30/2020-12/29/2021 

## 2021-07-06 ENCOUNTER — Other Ambulatory Visit: Payer: Self-pay | Admitting: Internal Medicine

## 2021-07-06 DIAGNOSIS — N1832 Chronic kidney disease, stage 3b: Secondary | ICD-10-CM

## 2021-07-06 DIAGNOSIS — E1122 Type 2 diabetes mellitus with diabetic chronic kidney disease: Secondary | ICD-10-CM

## 2021-07-06 MED ORDER — KERENDIA 20 MG PO TABS
1.0000 | ORAL_TABLET | Freq: Every day | ORAL | 1 refills | Status: DC
Start: 2021-07-06 — End: 2021-12-13

## 2021-07-18 ENCOUNTER — Telehealth: Payer: Self-pay

## 2021-07-18 NOTE — Telephone Encounter (Signed)
Pt called asking to speak with Mignon Pine to discuss seeing a kidney specialist. Message sent to [facility] to advise.

## 2021-07-23 NOTE — Telephone Encounter (Signed)
Pt states she is taking the [place] as prescribed.  She also has questions about a dietician referral to help figuring out ways to prep & eat healthier.  Spent approx 23mns discussing food choices. Pt offered sooner appt, but declines it at this time. Pt has agreed to work on diet & keep scheduled appt with PCP 09/13/21.

## 2021-08-15 DIAGNOSIS — L304 Erythema intertrigo: Secondary | ICD-10-CM | POA: Diagnosis not present

## 2021-08-16 ENCOUNTER — Ambulatory Visit: Payer: Medicare PPO | Admitting: Internal Medicine

## 2021-09-13 ENCOUNTER — Ambulatory Visit: Payer: Medicare PPO | Admitting: Internal Medicine

## 2021-09-13 ENCOUNTER — Telehealth: Payer: Self-pay | Admitting: Internal Medicine

## 2021-09-13 ENCOUNTER — Encounter: Payer: Self-pay | Admitting: Internal Medicine

## 2021-09-13 ENCOUNTER — Other Ambulatory Visit: Payer: Self-pay

## 2021-09-13 VITALS — BP 126/76 | HR 68 | Temp 98.3°F | Resp 16 | Ht 65.0 in | Wt 151.0 lb

## 2021-09-13 DIAGNOSIS — N1832 Chronic kidney disease, stage 3b: Secondary | ICD-10-CM

## 2021-09-13 DIAGNOSIS — Z23 Encounter for immunization: Secondary | ICD-10-CM

## 2021-09-13 DIAGNOSIS — E1122 Type 2 diabetes mellitus with diabetic chronic kidney disease: Secondary | ICD-10-CM | POA: Diagnosis not present

## 2021-09-13 DIAGNOSIS — E039 Hypothyroidism, unspecified: Secondary | ICD-10-CM | POA: Diagnosis not present

## 2021-09-13 DIAGNOSIS — I10 Essential (primary) hypertension: Secondary | ICD-10-CM

## 2021-09-13 LAB — BASIC METABOLIC PANEL
BUN: 23 mg/dL (ref 6–23)
CO2: 27 mEq/L (ref 19–32)
Calcium: 9.9 mg/dL (ref 8.4–10.5)
Chloride: 103 mEq/L (ref 96–112)
Creatinine, Ser: 1.09 mg/dL (ref 0.40–1.20)
GFR: 48.89 mL/min — ABNORMAL LOW (ref >60.00)
Glucose, Bld: 109 mg/dL — ABNORMAL HIGH (ref 70–99)
Potassium: 3.9 mEq/L (ref 3.5–5.1)
Sodium: 140 mEq/L (ref 135–145)

## 2021-09-13 LAB — TSH: TSH: 0.19 u[IU]/mL — ABNORMAL LOW (ref 0.35–5.50)

## 2021-09-13 LAB — HEMOGLOBIN A1C: Hgb A1c MFr Bld: 6.1 % (ref 4.6–6.5)

## 2021-09-13 NOTE — Telephone Encounter (Signed)
  Patient states she was asked to call back and provide last eye appointment information   Patient calling to report she had an eye appointment on 03/06/21 with Dr Darleen Crocker

## 2021-09-13 NOTE — Progress Notes (Signed)
Subjective:  Patient ID: Natalie Shannon, female    DOB: February 14, 1941  Age: 80 y.o. MRN: [id number]  CC: Diabetes and Hypothyroidism  This visit occurred during the SARS-CoV-2 public health emergency.  Safety protocols were in place, including screening questions prior to the visit, additional usage of staff PPE, and extensive cleaning of exam room while observing appropriate contact time as indicated for disinfecting solutions.    HPI [name] presents for f/up -   She has intentionally lost weight with lifestyle modifications.  She denies polys, chest pain, shortness of breath, diaphoresis, edema, dizziness, or lightheadedness.  Outpatient Medications Prior to Visit  Medication Sig Dispense Refill   albuterol (VENTOLIN HFA) 108 (90 Base) MCG/ACT inhaler Inhale 2 puffs into the lungs every 6 (six) hours as needed for wheezing or shortness of breath. 1 Inhaler 3   atorvastatin (LIPITOR) 10 MG tablet Take 1 tablet (10 mg total) by mouth daily. 90 tablet 1   budesonide (PULMICORT FLEXHALER) 180 MCG/ACT inhaler Inhale 2 puffs into the lungs 2 (two) times daily. 6 each 1   famotidine (PEPCID) 40 MG tablet Take 1 tablet (40 mg total) by mouth daily. 90 tablet 1   Finerenone (KERENDIA) 20 MG TABS Take 1 tablet by mouth daily. 90 tablet 1   losartan (COZAAR) 100 MG tablet Take 1 tablet (100 mg total) by mouth daily. 90 tablet 1   montelukast (SINGULAIR) 10 MG tablet TAKE ONE TABLET AT BEDTIME. 90 tablet 1   levothyroxine (SYNTHROID) 112 MCG tablet Take 1 tablet (112 mcg total) by mouth daily. 90 tablet 1   No facility-administered medications prior to visit.    ROS Review of Systems  Constitutional:  Negative for diaphoresis and fatigue.  HENT: Negative.    Eyes: Negative.   Respiratory:  Negative for cough, shortness of breath and wheezing.   Cardiovascular:  Negative for chest pain, palpitations and leg swelling.  Gastrointestinal:  Negative for abdominal pain, constipation,  diarrhea, nausea and vomiting.  Endocrine: Negative for cold intolerance and heat intolerance.  Genitourinary: Negative.  Negative for difficulty urinating.  Musculoskeletal:  Negative for arthralgias and myalgias.  Skin: Negative.  Negative for color change.  Neurological:  Negative for dizziness, weakness, light-headedness and numbness.  Hematological:  Negative for adenopathy. Does not bruise/bleed easily.  Psychiatric/Behavioral: Negative.     Objective:  BP 126/76 (BP Location: Left Arm, Patient Position: Sitting, Cuff Size: Large)   Pulse 68   Temp 98.3 F (36.8 C) (Oral)   Resp 16   Ht '5\' 5"'$  (1.651 m)   Wt 151 lb (68.5 kg)   SpO2 99%   BMI 25.13 kg/m   BP Readings from Last 3 Encounters:  09/13/21 126/76  05/17/21 (!) 156/86  09/13/20 (!) 142/82    Wt Readings from Last 3 Encounters:  09/13/21 151 lb (68.5 kg)  05/17/21 172 lb (78 kg)  09/13/20 168 lb (76.2 kg)    Physical Exam Vitals reviewed.  HENT:     Nose: Nose normal.     Mouth/Throat:     Mouth: Mucous membranes are moist.  Eyes:     General: No scleral icterus.    Conjunctiva/sclera: Conjunctivae normal.  Cardiovascular:     Rate and Rhythm: Normal rate and regular rhythm.     Heart sounds: No murmur heard. Pulmonary:     Effort: Pulmonary effort is normal.     Breath sounds: No stridor. No wheezing, rhonchi or rales.  Abdominal:     General:  Abdomen is flat.     Palpations: There is no mass.     Tenderness: There is no abdominal tenderness. There is no guarding.     Hernia: No hernia is present.  Musculoskeletal:        General: Normal range of motion.     Cervical back: Neck supple.     Right lower leg: No edema.     Left lower leg: No edema.  Lymphadenopathy:     Cervical: No cervical adenopathy.  Skin:    General: Skin is warm and dry.  Neurological:     General: No focal deficit present.     Mental Status: She is alert.  Psychiatric:        Mood and Affect: Mood normal.         Behavior: Behavior normal.    Lab Results  Component Value Date   WBC 6.6 05/17/2021   HGB 14.7 05/17/2021   HCT 44.0 05/17/2021   PLT 353.0 05/17/2021   GLUCOSE 109 (H) 09/13/2021   CHOL 171 05/17/2021   TRIG 199.0 (H) 05/17/2021   HDL 48.40 05/17/2021   LDLDIRECT 173.0 12/07/2018   LDLCALC 83 05/17/2021   ALT 29 05/17/2021   AST 25 05/17/2021   NA 140 09/13/2021   K 3.9 09/13/2021   CL 103 09/13/2021   CREATININE 1.09 09/13/2021   BUN 23 09/13/2021   CO2 27 09/13/2021   TSH 0.19 (L) 09/13/2021   INR 1.01 05/15/2012   HGBA1C 6.1 09/13/2021    No results found.  Assessment & Plan:   Maddy was seen today for diabetes and hypothyroidism.  Diagnoses and all orders for this visit:  Flu vaccine need -     Flu Vaccine QUAD High Dose(Fluad)  Essential hypertension- Her blood pressure is well controlled. -     Basic metabolic panel; Future -     Basic metabolic panel  Acquired hypothyroidism- Her TSH is suppressed.  Will decrease the dose of T4. -     TSH; Future -     TSH -     levothyroxine (SYNTHROID) 100 MCG tablet; Take 1 tablet (100 mcg total) by mouth daily before breakfast.  Type 2 diabetes mellitus with stage 3b chronic kidney disease, without long-term current use of insulin ([place])- Her blood sugar is adequately well controlled. -     Basic metabolic panel; Future -     Hemoglobin A1c; Future -     Amb Referral to Nutrition and Diabetic Education -     Hemoglobin A1c -     Basic metabolic panel -     HM Diabetes Foot Exam  Stage 3b chronic kidney disease- Her renal function is stable.  Will continue to address risk factor modifications.  I have discontinued Maciel A. Darrough's levothyroxine. I am also having her start on levothyroxine. Additionally, I am having her maintain her albuterol, Pulmicort Flexhaler, atorvastatin, famotidine, losartan, montelukast, and Kerendia.  Meds ordered this encounter  Medications   levothyroxine (SYNTHROID) 100 MCG tablet     Sig: Take 1 tablet (100 mcg total) by mouth daily before breakfast.    Dispense:  90 tablet    Refill:  0      Follow-up: No follow-ups on file.  Scarlette Calico, MD

## 2021-09-14 ENCOUNTER — Encounter: Payer: Self-pay | Admitting: Internal Medicine

## 2021-09-14 MED ORDER — LEVOTHYROXINE SODIUM 100 MCG PO TABS
100.0000 ug | ORAL_TABLET | Freq: Every day | ORAL | 0 refills | Status: DC
Start: 2021-09-14 — End: 2022-03-29

## 2021-09-15 ENCOUNTER — Encounter: Payer: Self-pay | Admitting: Internal Medicine

## 2021-09-15 NOTE — Patient Instructions (Signed)

## 2021-10-30 ENCOUNTER — Other Ambulatory Visit: Payer: Self-pay

## 2021-10-30 ENCOUNTER — Encounter: Payer: Medicare PPO | Attending: Internal Medicine | Admitting: Dietician

## 2021-10-30 DIAGNOSIS — N1832 Chronic kidney disease, stage 3b: Secondary | ICD-10-CM | POA: Diagnosis not present

## 2021-10-30 DIAGNOSIS — E1122 Type 2 diabetes mellitus with diabetic chronic kidney disease: Secondary | ICD-10-CM | POA: Insufficient documentation

## 2021-11-06 ENCOUNTER — Encounter: Payer: Self-pay | Admitting: Dietician

## 2021-11-06 ENCOUNTER — Encounter: Payer: Medicare PPO | Admitting: Dietician

## 2021-11-06 DIAGNOSIS — N1832 Type 2 diabetes mellitus with diabetic chronic kidney disease: Secondary | ICD-10-CM

## 2021-11-06 DIAGNOSIS — E1122 Type 2 diabetes mellitus with diabetic chronic kidney disease: Secondary | ICD-10-CM

## 2021-11-06 NOTE — Progress Notes (Signed)
Patient was seen on 10/30/21 for the first of a series of three diabetes self-management courses at the [facility].  Patient Education Plan per assessed needs and concerns is to attend three course education program for Diabetes Self Management Education.  The following learning objectives were met by the patient during this class: Describe diabetes, types of diabetes and pathophysiology State some common risk factors for diabetes Defines the role of glucose and insulin Describe the relationship between diabetes and cardiovascular and other risks State the members of the Healthcare Team States the rationale for glucose monitoring and when to test State their individual Target Range State the importance of logging glucose readings and how to interpret the readings Identifies A1C target Explain the correlation between A1c and eAG values State symptoms and treatment of high blood glucose and low blood glucose Explain proper technique for glucose testing and identify proper sharps disposal  Handouts given during class include: How to Thrive:  A Guide for Your Journey with Diabetes by the ADA Meal Plan Card and carbohydrate content list Dietary intake form Low Sodium Flavoring Tips Types of Fats Dining Out Label reading Snack list The diabetes portion plate Diabetes Resources A1c to eAG Conversion Chart Blood Glucose Log Diabetes Recommended Care Schedule Support Group Diabetes Success Plan Core Class Satisfaction Survey   Follow-Up Plan: Attend core 2   

## 2021-11-07 DIAGNOSIS — N958 Other specified menopausal and perimenopausal disorders: Secondary | ICD-10-CM | POA: Diagnosis not present

## 2021-11-07 DIAGNOSIS — R29890 Loss of height: Secondary | ICD-10-CM | POA: Diagnosis not present

## 2021-11-13 ENCOUNTER — Encounter: Payer: Self-pay | Admitting: Dietician

## 2021-11-13 ENCOUNTER — Other Ambulatory Visit: Payer: Self-pay

## 2021-11-13 ENCOUNTER — Encounter: Payer: Medicare PPO | Admitting: Dietician

## 2021-11-13 DIAGNOSIS — E1122 Type 2 diabetes mellitus with diabetic chronic kidney disease: Secondary | ICD-10-CM | POA: Diagnosis not present

## 2021-11-13 DIAGNOSIS — N1832 Chronic kidney disease, stage 3b: Secondary | ICD-10-CM

## 2021-11-13 NOTE — Progress Notes (Signed)
Patient was seen on 11/06/2021 for the second of a series of three diabetes self-management courses at the [facility]. The following learning objectives were met by the patient during this class:  Describe the role of different macronutrients on glucose Explain how carbohydrates affect blood glucose State what foods contain the most carbohydrates Demonstrate carbohydrate counting Demonstrate how to read Nutrition Facts food label Describe effects of various fats on heart health Describe the importance of good nutrition for health and healthy eating strategies Describe techniques for managing your shopping, cooking and meal planning List strategies to follow meal plan when dining out Describe the effects of alcohol on glucose and how to use it safely  Goals:  Follow Diabetes Meal Plan as instructed  Aim to spread carbs evenly throughout the day  Aim for 3 meals per day and snacks as needed Include lean protein foods to meals/snacks  Monitor glucose levels as instructed by your doctor   Follow-Up Plan: Attend Core 3 Work towards following your personal food plan.

## 2021-11-18 ENCOUNTER — Encounter: Payer: Self-pay | Admitting: Dietician

## 2021-11-18 NOTE — Progress Notes (Signed)
Patient was seen on 11/13/2021 for the third of a series of three diabetes self-management courses at the [facility].   State the amount of activity recommended for healthy living Describe activities suitable for individual needs Identify ways to regularly incorporate activity into daily life Identify barriers to activity and ways to over come these barriers Identify diabetes medications being personally used and their primary action for lowering glucose and possible side effects Describe role of stress on blood glucose and develop strategies to address psychosocial issues Identify diabetes complications and ways to prevent them Explain how to manage diabetes during illness Evaluate success in meeting personal goal Establish 2-3 goals that they will plan to diligently work on  Goals:  I will count my carb choices at most meals and snacks I will eat less unhealthy fats  Your patient has identified these potential barriers to change:  Motivation  Your patient has identified their diabetes self-care support plan as  On-line Resources    Plan:  Attend Support Group as desired

## 2021-12-03 ENCOUNTER — Other Ambulatory Visit: Payer: Self-pay | Admitting: Internal Medicine

## 2021-12-03 DIAGNOSIS — J4520 Mild intermittent asthma, uncomplicated: Secondary | ICD-10-CM

## 2021-12-03 DIAGNOSIS — E785 Hyperlipidemia, unspecified: Secondary | ICD-10-CM

## 2021-12-03 DIAGNOSIS — I10 Essential (primary) hypertension: Secondary | ICD-10-CM

## 2021-12-06 ENCOUNTER — Other Ambulatory Visit: Payer: Self-pay | Admitting: Internal Medicine

## 2021-12-12 ENCOUNTER — Encounter: Payer: Self-pay | Admitting: Internal Medicine

## 2021-12-12 DIAGNOSIS — Z1231 Encounter for screening mammogram for malignant neoplasm of breast: Secondary | ICD-10-CM | POA: Diagnosis not present

## 2021-12-12 LAB — HM MAMMOGRAPHY

## 2021-12-13 ENCOUNTER — Encounter: Payer: Self-pay | Admitting: Internal Medicine

## 2021-12-13 ENCOUNTER — Other Ambulatory Visit: Payer: Self-pay

## 2021-12-13 ENCOUNTER — Ambulatory Visit: Payer: Medicare PPO | Admitting: Internal Medicine

## 2021-12-13 VITALS — BP 136/82 | HR 79 | Temp 98.2°F | Resp 16 | Ht 65.0 in | Wt 146.0 lb

## 2021-12-13 DIAGNOSIS — I10 Essential (primary) hypertension: Secondary | ICD-10-CM | POA: Diagnosis not present

## 2021-12-13 DIAGNOSIS — E039 Hypothyroidism, unspecified: Secondary | ICD-10-CM

## 2021-12-13 DIAGNOSIS — E1122 Type 2 diabetes mellitus with diabetic chronic kidney disease: Secondary | ICD-10-CM

## 2021-12-13 DIAGNOSIS — N1832 Chronic kidney disease, stage 3b: Secondary | ICD-10-CM | POA: Diagnosis not present

## 2021-12-13 LAB — BASIC METABOLIC PANEL
BUN: 28 mg/dL — ABNORMAL HIGH (ref 6–23)
CO2: 29 mEq/L (ref 19–32)
Calcium: 10.1 mg/dL (ref 8.4–10.5)
Chloride: 103 mEq/L (ref 96–112)
Creatinine, Ser: 1.04 mg/dL (ref 0.40–1.20)
GFR: 51.63 mL/min — ABNORMAL LOW (ref >60.00)
Glucose, Bld: 103 mg/dL — ABNORMAL HIGH (ref 70–99)
Potassium: 4.4 mEq/L (ref 3.5–5.1)
Sodium: 140 mEq/L (ref 135–145)

## 2021-12-13 LAB — TSH: TSH: 3.75 u[IU]/mL (ref 0.35–5.50)

## 2021-12-13 MED ORDER — KERENDIA 20 MG PO TABS
1.0000 | ORAL_TABLET | Freq: Every day | ORAL | 1 refills | Status: DC
Start: 2021-12-13 — End: 2022-06-14

## 2021-12-13 NOTE — Progress Notes (Signed)
Subjective:  Patient ID: Natalie Shannon, female    DOB: November 05, 1941  Age: 80 y.o. MRN: [id number]  CC: Hypertension, Diabetes, and Hypothyroidism  This visit occurred during the SARS-CoV-2 public health emergency.  Safety protocols were in place, including screening questions prior to the visit, additional usage of staff PPE, and extensive cleaning of exam room while observing appropriate contact time as indicated for disinfecting solutions.    HPI PAIRLEE SAWTELL presents for f/up -   She has felt well recently and offers no complaints.  She is active and denies chest pain, shortness of breath, diaphoresis, dizziness, lightheadedness, or edema.  Outpatient Medications Prior to Visit  Medication Sig Dispense Refill   albuterol (VENTOLIN HFA) 108 (90 Base) MCG/ACT inhaler Inhale 2 puffs into the lungs every 6 (six) hours as needed for wheezing or shortness of breath. 1 Inhaler 3   atorvastatin (LIPITOR) 10 MG tablet Take 1 tablet (10 mg total) by mouth daily. 90 tablet 1   budesonide (PULMICORT FLEXHALER) 180 MCG/ACT inhaler Inhale 2 puffs into the lungs 2 (two) times daily. 6 each 1   desonide (DESOWEN) 0.05 % ointment APPLY TOPICALLY TO THE AFFECTED AREA TWICE DAILY FOR 14 DAYS. 15 g 2   famotidine (PEPCID) 40 MG tablet Take 1 tablet (40 mg total) by mouth daily. 90 tablet 1   levothyroxine (SYNTHROID) 100 MCG tablet Take 1 tablet (100 mcg total) by mouth daily before breakfast. 90 tablet 0   losartan (COZAAR) 100 MG tablet Take 1 tablet (100 mg total) by mouth daily. 90 tablet 1   montelukast (SINGULAIR) 10 MG tablet TAKE ONE TABLET BY MOUTH AT BEDTIME 90 tablet 1   Finerenone (KERENDIA) 20 MG TABS Take 1 tablet by mouth daily. 90 tablet 1   No facility-administered medications prior to visit.    ROS Review of Systems  Constitutional:  Negative for chills, diaphoresis and fatigue.  HENT: Negative.    Eyes: Negative.   Respiratory:  Negative for cough, chest tightness, shortness  of breath and wheezing.   Cardiovascular:  Negative for chest pain, palpitations and leg swelling.  Gastrointestinal:  Negative for abdominal pain, constipation, diarrhea and vomiting.  Endocrine: Negative for cold intolerance and heat intolerance.  Genitourinary: Negative.  Negative for difficulty urinating.  Musculoskeletal: Negative.   Skin: Negative.   Neurological:  Negative for dizziness, weakness, light-headedness and headaches.  Hematological:  Negative for adenopathy. Does not bruise/bleed easily.  Psychiatric/Behavioral: Negative.     Objective:  BP 136/82 (BP Location: Left Arm, Patient Position: Sitting, Cuff Size: Large)    Pulse 79    Temp 98.2 F (36.8 C) (Oral)    Resp 16    Ht 5\' 5"  (1.651 m)    Wt 146 lb (66.2 kg)    SpO2 98%    BMI 24.30 kg/m   BP Readings from Last 3 Encounters:  12/13/21 136/82  09/13/21 126/76  05/17/21 (!) 156/86    Wt Readings from Last 3 Encounters:  12/13/21 146 lb (66.2 kg)  11/05/21 148 lb (67.1 kg)  09/13/21 151 lb (68.5 kg)    Physical Exam Vitals reviewed.  HENT:     Nose: Nose normal.     Mouth/Throat:     Mouth: Mucous membranes are moist.  Eyes:     General: No scleral icterus.    Conjunctiva/sclera: Conjunctivae normal.  Cardiovascular:     Rate and Rhythm: Normal rate and regular rhythm.     Heart sounds: No murmur heard. Pulmonary:  Effort: Pulmonary effort is normal.     Breath sounds: No stridor. No wheezing, rhonchi or rales.  Abdominal:     General: Abdomen is flat.     Palpations: There is no mass.     Tenderness: There is no abdominal tenderness. There is no guarding.     Hernia: No hernia is present.  Musculoskeletal:        General: Normal range of motion.     Cervical back: Neck supple.     Right lower leg: No edema.     Left lower leg: No edema.  Lymphadenopathy:     Cervical: No cervical adenopathy.  Skin:    General: Skin is warm and dry.  Neurological:     General: No focal deficit present.      Mental Status: She is alert.  Psychiatric:        Mood and Affect: Mood normal.        Behavior: Behavior normal.    Lab Results  Component Value Date   WBC 6.6 05/17/2021   HGB 14.7 05/17/2021   HCT 44.0 05/17/2021   PLT 353.0 05/17/2021   GLUCOSE 103 (H) 12/13/2021   CHOL 171 05/17/2021   TRIG 199.0 (H) 05/17/2021   HDL 48.40 05/17/2021   LDLDIRECT 173.0 12/07/2018   LDLCALC 83 05/17/2021   ALT 29 05/17/2021   AST 25 05/17/2021   NA 140 12/13/2021   K 4.4 12/13/2021   CL 103 12/13/2021   CREATININE 1.04 12/13/2021   BUN 28 (H) 12/13/2021   CO2 29 12/13/2021   TSH 3.75 12/13/2021   INR 1.01 05/15/2012   HGBA1C 6.1 09/13/2021    No results found.  Assessment & Plan:   Natalie Shannon was seen today for hypertension, diabetes and hypothyroidism.  Diagnoses and all orders for this visit:  Essential hypertension- Her blood pressure is well controlled. -     Basic metabolic panel; Future -     Basic metabolic panel  Acquired hypothyroidism- Her TSH is in the normal range.  She will stay on the current dose of levothyroxine. -     TSH; Future -     TSH  Type 2 diabetes mellitus with stage 3b chronic kidney disease, without long-term current use of insulin ([place])- Her blood sugar is adequately well controlled. -     Finerenone (KERENDIA) 20 MG TABS; Take 1 tablet by mouth daily. -     Basic metabolic panel; Future -     Basic metabolic panel  Stage 3b chronic kidney disease- Her kidney function has improved and her potassium level is normal.  Will continue the current dose of finerenone. -     Finerenone (KERENDIA) 20 MG TABS; Take 1 tablet by mouth daily. -     Basic metabolic panel; Future -     Basic metabolic panel  I am having Natalie Shannon maintain her albuterol, Pulmicort Flexhaler, famotidine, levothyroxine, atorvastatin, montelukast, losartan, desonide, and Kerendia.  Meds ordered this encounter  Medications   Finerenone (KERENDIA) 20 MG TABS    Sig: Take  1 tablet by mouth daily.    Dispense:  90 tablet    Refill:  1     Follow-up: Return in about 6 months (around 06/13/2022).  Natalie Calico, MD

## 2021-12-13 NOTE — Patient Instructions (Signed)

## 2021-12-17 ENCOUNTER — Encounter: Payer: Medicare PPO | Attending: Internal Medicine | Admitting: Dietician

## 2021-12-17 ENCOUNTER — Encounter: Payer: Self-pay | Admitting: Dietician

## 2021-12-17 ENCOUNTER — Other Ambulatory Visit: Payer: Self-pay

## 2021-12-17 DIAGNOSIS — E1122 Type 2 diabetes mellitus with diabetic chronic kidney disease: Secondary | ICD-10-CM | POA: Diagnosis not present

## 2021-12-17 DIAGNOSIS — N1832 Chronic kidney disease, stage 3b: Secondary | ICD-10-CM | POA: Insufficient documentation

## 2021-12-17 NOTE — Patient Instructions (Addendum)
Please call if you have any questions.  You are doing a great job! Continue low sodium Mindful eating No need for a potassium restriction.

## 2021-12-17 NOTE — Progress Notes (Signed)
Start:  10:30 End 11:30  Patient attended Diabetes Core Classes 1-3 between 10/30/2021 and 11/13/2021 at [facility]. The purpose of the meeting today is to review information learned during those classes as well as review patient application and goals. She would like to learn more about a diet that is healthy for her kidneys and diabetes.  She has chosen a diet for kidney failure for stages 4-5 by studying on the Internet from several university plans.  What are one or two positive things that you are doing right now to manage your diabetes?  Diet, weight loss, and walking  What is the hardest part about your diabetes right now, causing you the most concern, or is the most worrisome to you about your diabetes?  "The diet I put myself on"  What questions do you have today?  Renal diet with diabetes  Have you participated in any diabetes support group?  no  History:  Type 2 Diabetes, HTN, CKD stage 3a, hypothyroidism A1C:  6.1% 09/13/2021 decreased from 6.6% 04/2021 Other labs include:  BUN 28, Creatinine 1.04, Potassium 4.4, GFR 51 Medications include:  finerenone  Weight:  145 lbs 12/17/2021 decreased from 172 lbs in the past 9 months.  She is now at her goal weight.  She gained weight after retirement.  Blood Glucose:  Fasting:  n/a  2 hours after starting a meal:  n/a  Have you had any low blood sugar readings in the past month?  no  Social History:  Patient lives alone.  Enjoys walking her dog and spends about 3 hours each day outside daily with the dog, walking and playing.  She also uses a peddler.  She does not cook and eats simple foods.  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  She used to have horses.  24 hour diet recall: Loves sweets but avoids. Allergic to black pepper Breakfast:  instant oatmeal ORcereal with blueberries, stevia, skim milk  Snack:  none Lunch:  omelet OR egg sandwich with cauliflower OR white rice with  vegetabes, cheese slider OR hamburger with vegetables Snack:  none Dinner:  tuna salad with ranch, crackers, fruit OR hamburger on a bun OR navey beans, slaw, diet yogurt Snack:  handful of walnuts Beverages:  water with diet cranberry juice   Specific focus but not limited to the following: Discussed basics of renal diet.  Low sodium label reading, seasoning, and food prep tips.  Avoid foods with phos... in the ingredient list.  No potassium restriction is needed at this time as her lab potassium is normal.  Continued small portion of protein. Continued mindful habits with meals for diabetes Encouraged continued exercise  Continuing Goals: Continue low sodium Mindful eating No potassium restriction Continue to stay active  Handouts: [facility] national kidney diet DISH UP a Kidney Friendly Meal (for those without dialysis)  Future Follow up:  3 months or as desired

## 2021-12-20 LAB — HM DIABETES EYE EXAM

## 2022-01-02 ENCOUNTER — Telehealth: Payer: Self-pay

## 2022-01-02 NOTE — Telephone Encounter (Signed)
Key: [id number]  Per CoverMyMeds: Authorization already on file for this request. Authorization starting on 12/30/2021 and ending on 12/29/2022.

## 2022-01-08 ENCOUNTER — Telehealth: Payer: Self-pay | Admitting: Internal Medicine

## 2022-01-08 NOTE — Telephone Encounter (Signed)
Left message for patient to call me back at [phone] to schedule Medicare Annual Wellness Visit   Last AWV  10/03/20  Please schedule at anytime with [facility] if patient calls the office back.    37 Minutes appointment   Any questions, please call me at [phone]

## 2022-01-10 ENCOUNTER — Other Ambulatory Visit: Payer: Self-pay | Admitting: Internal Medicine

## 2022-01-10 DIAGNOSIS — K219 Gastro-esophageal reflux disease without esophagitis: Secondary | ICD-10-CM

## 2022-01-16 ENCOUNTER — Ambulatory Visit (INDEPENDENT_AMBULATORY_CARE_PROVIDER_SITE_OTHER): Payer: Medicare PPO

## 2022-01-16 ENCOUNTER — Other Ambulatory Visit: Payer: Self-pay

## 2022-01-16 VITALS — BP 120/60 | HR 68 | Temp 98.0°F | Resp 16 | Ht 65.0 in | Wt 144.0 lb

## 2022-01-16 DIAGNOSIS — Z0000 Encounter for general adult medical examination without abnormal findings: Secondary | ICD-10-CM

## 2022-01-16 NOTE — Progress Notes (Signed)
Subjective:   Natalie Shannon is a 81 y.o. female who presents for Medicare Annual (Subsequent) preventive examination.  Review of Systems     Cardiac Risk Factors include: advanced age (>57men, >80 women);dyslipidemia;family history of premature cardiovascular disease;hypertension     Objective:    Today's Vitals   01/16/22 1343  BP: 120/60  Pulse: 68  Resp: 16  Temp: 98 F (36.7 C)  SpO2: 97%  Weight: 144 lb (65.3 kg)  Height: 5\' 5"  (1.651 m)  PainSc: 0-No pain   Body mass index is 23.96 kg/m.  Advanced Directives 01/16/2022 11/05/2021 10/03/2020 12/04/2017 11/01/2016 11/01/2015 10/31/2015  Does Patient Have a Medical Advance Directive? No Yes Yes Yes Yes Yes Yes  Type of Advance Directive - - - - Press photographer;Living will [place];Living will [place];Living will  Does patient want to make changes to medical advance directive? - - No - Patient declined Yes (MAU/Ambulatory/Procedural Areas - Information given) No - Patient declined No - Patient declined No - Patient declined  Copy of [place] in Chart? - - - - Yes Yes Yes  Would patient like information on creating a medical advance directive? No - Patient declined - - - - - -    Current Medications (verified) Outpatient Encounter Medications as of 01/16/2022  Medication Sig   albuterol (VENTOLIN HFA) 108 (90 Base) MCG/ACT inhaler Inhale 2 puffs into the lungs every 6 (six) hours as needed for wheezing or shortness of breath.   atorvastatin (LIPITOR) 10 MG tablet Take 1 tablet (10 mg total) by mouth daily.   budesonide (PULMICORT FLEXHALER) 180 MCG/ACT inhaler Inhale 2 puffs into the lungs 2 (two) times daily.   desonide (DESOWEN) 0.05 % ointment APPLY TOPICALLY TO THE AFFECTED AREA TWICE DAILY FOR 14 DAYS.   famotidine (PEPCID) 40 MG tablet Take 1 tablet (40 mg total) by mouth daily.   Finerenone (KERENDIA) 20 MG TABS Take 1 tablet by mouth daily.    levothyroxine (SYNTHROID) 100 MCG tablet Take 1 tablet (100 mcg total) by mouth daily before breakfast.   losartan (COZAAR) 100 MG tablet Take 1 tablet (100 mg total) by mouth daily.   montelukast (SINGULAIR) 10 MG tablet TAKE ONE TABLET BY MOUTH AT BEDTIME   No facility-administered encounter medications on file as of 01/16/2022.    Allergies (verified) Latex, Povidone-iodine, Simvastatin, Sulfonamide derivatives, Azithromycin, and Doxycycline   History: Past Medical History:  Diagnosis Date   Asthma    Diabetes mellitus without complication (HCC)    GERD (gastroesophageal reflux disease)    History of vertebral fracture 2013   Hyperlipidemia    Hypertension    Pneumothorax, traumatic 1993   rib fracture x4 (thrown from horse)   Urticaria    ? solar induced   Past Surgical History:  Procedure Laterality Date   COLONOSCOPY     Neg X3, benign polyps ;due 2019, Dr Sharlett Iles   DILATION AND CURETTAGE OF UTERUS  02/2007    Dr Radene Knee   TONSILLECTOMY     Family History  Problem Relation Age of Onset   Asthma Father    Diabetes Mother    Stroke Mother 2       post carotid endarterectomy   Hypertension Mother    Atrial fibrillation Brother    Kidney disease Brother        idiopathic   Stroke Maternal Grandfather        in 48s   Heart attack  Maternal Uncle        after 55   Breast cancer Maternal Aunt    Social History   Socioeconomic History   Marital status: Divorced    Spouse name: Not on file   Number of children: Not on file   Years of education: Not on file   Highest education level: Not on file  Occupational History   Occupation: Retired Marine scientist  Tobacco Use   Smoking status: Former    Types: Cigarettes    Quit date: 12/31/1967    Years since quitting: 54.0   Smokeless tobacco: Former   Tobacco comments:    smoked 1965-1969, up 1/2 ppd  Vaping Use   Vaping Use: Never used  Substance and Sexual Activity   Alcohol use: No   Drug use: No   Sexual activity:  Not Currently  Other Topics Concern   Not on file  Social History Narrative   Please schedule all AWV via phone per patient's request.   Social Determinants of Health   Financial Resource Strain: Low Risk    Difficulty of Paying Living Expenses: Not hard at all  Food Insecurity: No Food Insecurity   Worried About Charity fundraiser in the Last Year: Never true   [place] in the Last Year: Never true  Transportation Needs: No Transportation Needs   Lack of Transportation (Medical): No   Lack of Transportation (Non-Medical): No  Physical Activity: Inactive   Days of Exercise per Week: 0 days   Minutes of Exercise per Session: 0 min  Stress: No Stress Concern Present   Feeling of Stress : Not at all  Social Connections: Socially Integrated   Frequency of Communication with Friends and Family: More than three times a week   Frequency of Social Gatherings with Friends and Family: More than three times a week   Attends Religious Services: 1 to 4 times per year   Active Member of [employer] or Organizations: Yes   Attends Archivist Meetings: 1 to 4 times per year   Marital Status: Married    Tobacco Counseling Counseling given: Not Answered Tobacco comments: smoked 1965-1969, up 1/2 ppd   Clinical Intake:  Pre-visit preparation completed: Yes  Pain : No/denies pain Pain Score: 0-No pain     BMI - recorded: 23.96 Nutritional Status: BMI of 19-24  Normal Nutritional Risks: None Diabetes: Yes CBG done?: No Did pt. bring in CBG monitor from home?: No  How often do you need to have someone help you when you read instructions, pamphlets, or other written materials from your doctor or pharmacy?: 1 - Never What is the last grade level you completed in school?: Master's Degree  Diabetic? yes  Interpreter Needed?: No  Information entered by :: Lisette Abu, LPN   Activities of Daily Living In your present state of health, do you have any difficulty  performing the following activities: 01/16/2022 12/13/2021  Hearing? N N  Vision? N N  Difficulty concentrating or making decisions? N N  Walking or climbing stairs? N N  Dressing or bathing? N N  Doing errands, shopping? N N  Preparing Food and eating ? N -  Using the Toilet? N -  In the past six months, have you accidently leaked urine? Y -  Do you have problems with loss of bowel control? N -  Managing your Medications? N -  Managing your Finances? N -  Housekeeping or managing your Housekeeping? N -  Some recent data  might be hidden    Patient Care Team: Janith Lima, MD as PCP - General (Internal Medicine) Darleen Crocker, MD as Consulting Physician (Ophthalmology) Renda Rolls, Jennefer Bravo, MD as Referring Physician (Dermatology) Arvella Nigh, MD as Consulting Physician (Obstetrics and Gynecology) [facility], [facility] (Diagnostic Radiology)  Indicate any recent Medical Services you may have received from other than [facility] providers in the past year (date may be approximate).     Assessment:   This is a routine wellness examination for [facility].  Hearing/Vision screen Hearing Screening - Comments:: Patient denied any hearing difficulty.   No hearing aids.  Vision Screening - Comments:: Patient wears corrective glasses/contacts.  Eye exam done annually by: Darleen Crocker, MD.  Dietary issues and exercise activities discussed: Current Exercise Habits: Home exercise routine, Type of exercise: walking, Time (Minutes): 30, Frequency (Times/Week): 5, Weekly Exercise (Minutes/Week): 150, Intensity: Moderate, Exercise limited by: respiratory conditions(s)   Goals Addressed               This Visit's Progress     Patient Stated (pt-stated)        Yes; to get rid of those 2 pounds and continue working with nutritionist.       Depression Screen PHQ 2/9 Scores 01/16/2022 11/05/2021 10/03/2020 03/13/2020 12/08/2018 12/07/2018 12/04/2017  PHQ - 2 Score 0 0 0 0 0 0 0   PHQ- 9 Score - - - - - 0 -    Fall Risk Fall Risk  01/16/2022 11/05/2021 10/03/2020 03/13/2020 12/08/2018  Falls in the past year? 0 0 0 0 0  Number falls in past yr: 0 - 0 0 -  Injury with Fall? 0 - 0 0 -  Risk for fall due to : No Fall Risks - No Fall Risks - -  Follow up Falls evaluation completed - Falls evaluation completed Falls evaluation completed -    FALL RISK PREVENTION PERTAINING TO THE HOME:  Any stairs in or around the home? No  If so, are there any without handrails? No  Home free of loose throw rugs in walkways, pet beds, electrical cords, etc? Yes  Adequate lighting in your home to reduce risk of falls? Yes   ASSISTIVE DEVICES UTILIZED TO PREVENT FALLS:  Life alert? No  Use of a cane, walker or w/c? No  Grab bars in the bathroom? Yes  Shower chair or bench in shower? Yes  Elevated toilet seat or a handicapped toilet? Yes   TIMED UP AND GO:  Was the test performed? Yes .  Length of time to ambulate 10 feet: 6 sec.   Gait steady and fast without use of assistive device  Cognitive Function: Normal cognitive status assessed by direct observation by this Nurse Health Advisor. No abnormalities found.          Immunizations Immunization History  Administered Date(s) Administered   Fluad Quad(high Dose 65+) 09/13/2020, 09/13/2021   H1N1 01/12/2009   Influenza Split 10/15/2011, 09/29/2012, 10/07/2014, 09/30/2015   Influenza Whole 10/24/2009, 09/21/2010   Influenza, High Dose Seasonal PF 09/15/2017, 09/18/2018, 12/14/2019, 12/14/2019   Influenza,inj,Quad PF,6+ Mos 10/21/2013   Influenza-Unspecified 09/29/2016   Moderna SARS-COV2 Booster Vaccination 10/06/2020   Moderna Sars-Covid-2 Vaccination 01/31/2020, 02/27/2020   Pneumococcal Conjugate-13 10/30/2016   Pneumococcal Polysaccharide-23 12/30/2000, 09/28/2008, 03/13/2017   Td 05/12/1994, 12/30/2002   Tdap 08/10/2012   Zoster Recombinat (Shingrix) 12/14/2019, 04/05/2020   Zoster, Live 06/24/2008    TDAP  status: Up to date  Flu Vaccine status: Up to date  Pneumococcal vaccine status: Up to date  Covid-19 vaccine status: Completed vaccines  Qualifies for Shingles Vaccine? Yes   Zostavax completed Yes   Shingrix Completed?: Yes  Screening Tests Health Maintenance  Topic Date Due   COVID-19 Vaccine (3 - Moderna risk series) 11/03/2020   OPHTHALMOLOGY EXAM  03/06/2022   HEMOGLOBIN A1C  03/13/2022   TETANUS/TDAP  08/10/2022   FOOT EXAM  09/13/2022   Pneumonia Vaccine 49+ Years old  Completed   INFLUENZA VACCINE  Completed   DEXA SCAN  Completed   Hepatitis C Screening  Completed   Zoster Vaccines- Shingrix  Completed   HPV VACCINES  Aged Out    Health Maintenance  Health Maintenance Due  Topic Date Due   COVID-19 Vaccine (3 - Moderna risk series) 11/03/2020    Colorectal cancer screening: No longer required.   Mammogram status: Completed 12/12/2021. Repeat every year  Bone Density status: Completed 10/12/2014. Results reflect: Bone density results: NORMAL. Repeat every 5 years.  Lung Cancer Screening: (Low Dose CT Chest recommended if Age 108-80 years, 30 pack-year currently smoking OR have quit w/in 15years.) does not qualify.   Lung Cancer Screening Referral: no  Additional Screening:  Hepatitis C Screening: does qualify; Completed no  Vision Screening: Recommended annual ophthalmology exams for early detection of glaucoma and other disorders of the eye. Is the patient up to date with their annual eye exam?  Yes  Who is the provider or what is the name of the office in which the patient attends annual eye exams? Darleen Crocker, MD If pt is not established with a provider, would they like to be referred to a provider to establish care? No .   Dental Screening: Recommended annual dental exams for proper oral hygiene  Community Resource Referral / Chronic Care Management: CRR required this visit?  No   CCM required this visit?  No      Plan:     I have  personally reviewed and noted the following in the patients chart:   Medical and social history Use of alcohol, tobacco or illicit drugs  Current medications and supplements including opioid prescriptions.  Functional ability and status Nutritional status Physical activity Advanced directives List of other physicians Hospitalizations, surgeries, and ER visits in previous 12 months Vitals Screenings to include cognitive, depression, and falls Referrals and appointments  In addition, I have reviewed and discussed with patient certain preventive protocols, quality metrics, and best practice recommendations. A written personalized care plan for preventive services as well as general preventive health recommendations were provided to patient.     Sheral Flow, LPN   [id number]   Nurse Notes:  Hearing Screening - Comments:: Patient denied any hearing difficulty.   No hearing aids.  Vision Screening - Comments:: Patient wears corrective glasses/contacts.  Eye exam done annually by: Darleen Crocker, MD.

## 2022-01-16 NOTE — Patient Instructions (Addendum)
Natalie Shannon , Thank you for taking time to come for your Medicare Wellness Visit. I appreciate your ongoing commitment to your health goals. Please review the following plan we discussed and let me know if I can assist you in the future.   Screening recommendations/referrals: Colonoscopy: No longer recommended due to age 81: 12/12/2021 Bone Density: 10/12/2014 Recommended yearly ophthalmology/optometry visit for glaucoma screening and checkup Recommended yearly dental visit for hygiene and checkup  Vaccinations: Influenza vaccine: 09/13/2021 Pneumococcal vaccine: 10/30/2016, 03/13/2017 Tdap vaccine: 08/10/2012 Shingles vaccine: 12/14/2019, 04/05/2020   Covid-19: 01/31/2020, 02/27/2020, 10/06/2020  Advanced directives: Please bring a copy of your health care power of attorney and living will to the office at your convenience.  Conditions/risks identified: Yes; to get rid of those 2 pounds and continue working with nutritionist.   Next appointment: Please schedule your next Medicare Wellness Visit with your Nurse Health Advisor in 1 year by calling [phone].  Preventive Care 33 Years and Older, Female Preventive care refers to lifestyle choices and visits with your health care provider that can promote health and wellness. What does preventive care include? A yearly physical exam. This is also called an annual well check. Dental exams once or twice a year. Routine eye exams. Ask your health care provider how often you should have your eyes checked. Personal lifestyle choices, including: Daily care of your teeth and gums. Regular physical activity. Eating a healthy diet. Avoiding tobacco and drug use. Limiting alcohol use. Practicing safe sex. Taking low-dose aspirin every day. Taking vitamin and mineral supplements as recommended by your health care provider. What happens during an annual well check? The services and screenings done by your health care provider during your  annual well check will depend on your age, overall health, lifestyle risk factors, and family history of disease. Counseling  Your health care provider may ask you questions about your: Alcohol use. Tobacco use. Drug use. Emotional well-being. Home and relationship well-being. Sexual activity. Eating habits. History of falls. Memory and ability to understand (cognition). Work and work Statistician. Reproductive health. Screening  You may have the following tests or measurements: Height, weight, and BMI. Blood pressure. Lipid and cholesterol levels. These may be checked every 5 years, or more frequently if you are over 87 years old. Skin check. Lung cancer screening. You may have this screening every year starting at age 5 if you have a 30-pack-year history of smoking and currently smoke or have quit within the past 15 years. Fecal occult blood test (FOBT) of the stool. You may have this test every year starting at age 22. Flexible sigmoidoscopy or colonoscopy. You may have a sigmoidoscopy every 5 years or a colonoscopy every 10 years starting at age 61. Hepatitis C blood test. Hepatitis B blood test. Sexually transmitted disease (STD) testing. Diabetes screening. This is done by checking your blood sugar (glucose) after you have not eaten for a while (fasting). You may have this done every 1-3 years. Bone density scan. This is done to screen for osteoporosis. You may have this done starting at age 72. Mammogram. This may be done every 1-2 years. Talk to your health care provider about how often you should have regular mammograms. Talk with your health care provider about your test results, treatment options, and if necessary, the need for more tests. Vaccines  Your health care provider may recommend certain vaccines, such as: Influenza vaccine. This is recommended every year. Tetanus, diphtheria, and acellular pertussis (Tdap, Td) vaccine. You may need a Td  booster every 10  years. Zoster vaccine. You may need this after age 5. Pneumococcal 13-valent conjugate (PCV13) vaccine. One dose is recommended after age 25. Pneumococcal polysaccharide (PPSV23) vaccine. One dose is recommended after age 1. Talk to your health care provider about which screenings and vaccines you need and how often you need them. This information is not intended to replace advice given to you by your health care provider. Make sure you discuss any questions you have with your health care provider. Document Released: 01/12/2016 Document Revised: 09/04/2016 Document Reviewed: 10/17/2015 Elsevier Interactive Patient Education  2017 [place] Prevention in the Home Falls can cause injuries. They can happen to people of all ages. There are many things you can do to make your home safe and to help prevent falls. What can I do on the outside of my home? Regularly fix the edges of walkways and driveways and fix any cracks. Remove anything that might make you trip as you walk through a door, such as a raised step or threshold. Trim any bushes or trees on the path to your home. Use bright outdoor lighting. Clear any walking paths of anything that might make someone trip, such as rocks or tools. Regularly check to see if handrails are loose or broken. Make sure that both sides of any steps have handrails. Any raised decks and porches should have guardrails on the edges. Have any leaves, snow, or ice cleared regularly. Use sand or salt on walking paths during winter. Clean up any spills in your garage right away. This includes oil or grease spills. What can I do in the bathroom? Use night lights. Install grab bars by the toilet and in the tub and shower. Do not use towel bars as grab bars. Use non-skid mats or decals in the tub or shower. If you need to sit down in the shower, use a plastic, non-slip stool. Keep the floor dry. Clean up any water that spills on the floor as soon as it  happens. Remove soap buildup in the tub or shower regularly. Attach bath mats securely with double-sided non-slip rug tape. Do not have throw rugs and other things on the floor that can make you trip. What can I do in the bedroom? Use night lights. Make sure that you have a light by your bed that is easy to reach. Do not use any sheets or blankets that are too big for your bed. They should not hang down onto the floor. Have a firm chair that has side arms. You can use this for support while you get dressed. Do not have throw rugs and other things on the floor that can make you trip. What can I do in the kitchen? Clean up any spills right away. Avoid walking on wet floors. Keep items that you use a lot in easy-to-reach places. If you need to reach something above you, use a strong step stool that has a grab bar. Keep electrical cords out of the way. Do not use floor polish or wax that makes floors slippery. If you must use wax, use non-skid floor wax. Do not have throw rugs and other things on the floor that can make you trip. What can I do with my stairs? Do not leave any items on the stairs. Make sure that there are handrails on both sides of the stairs and use them. Fix handrails that are broken or loose. Make sure that handrails are as long as the stairways. Check any carpeting  to make sure that it is firmly attached to the stairs. Fix any carpet that is loose or worn. Avoid having throw rugs at the top or bottom of the stairs. If you do have throw rugs, attach them to the floor with carpet tape. Make sure that you have a light switch at the top of the stairs and the bottom of the stairs. If you do not have them, ask someone to add them for you. What else can I do to help prevent falls? Wear shoes that: Do not have high heels. Have rubber bottoms. Are comfortable and fit you well. Are closed at the toe. Do not wear sandals. If you use a stepladder: Make sure that it is fully opened.  Do not climb a closed stepladder. Make sure that both sides of the stepladder are locked into place. Ask someone to hold it for you, if possible. Clearly mark and make sure that you can see: Any grab bars or handrails. First and last steps. Where the edge of each step is. Use tools that help you move around (mobility aids) if they are needed. These include: Canes. Walkers. Scooters. Crutches. Turn on the lights when you go into a dark area. Replace any light bulbs as soon as they burn out. Set up your furniture so you have a clear path. Avoid moving your furniture around. If any of your floors are uneven, fix them. If there are any pets around you, be aware of where they are. Review your medicines with your doctor. Some medicines can make you feel dizzy. This can increase your chance of falling. Ask your doctor what other things that you can do to help prevent falls. This information is not intended to replace advice given to you by your health care provider. Make sure you discuss any questions you have with your health care provider. Document Released: 10/12/2009 Document Revised: 05/23/2016 Document Reviewed: 01/20/2015 Elsevier Interactive Patient Education  2017 [employer].

## 2022-03-07 DIAGNOSIS — H16223 Keratoconjunctivitis sicca, not specified as Sjogren's, bilateral: Secondary | ICD-10-CM | POA: Diagnosis not present

## 2022-03-07 DIAGNOSIS — H43813 Vitreous degeneration, bilateral: Secondary | ICD-10-CM | POA: Diagnosis not present

## 2022-03-07 DIAGNOSIS — Z961 Presence of intraocular lens: Secondary | ICD-10-CM | POA: Diagnosis not present

## 2022-03-07 DIAGNOSIS — H26493 Other secondary cataract, bilateral: Secondary | ICD-10-CM | POA: Diagnosis not present

## 2022-03-21 ENCOUNTER — Other Ambulatory Visit: Payer: Self-pay

## 2022-03-21 ENCOUNTER — Encounter: Payer: Self-pay | Admitting: Dietician

## 2022-03-21 ENCOUNTER — Encounter: Payer: Medicare PPO | Attending: Internal Medicine | Admitting: Dietician

## 2022-03-21 DIAGNOSIS — N1832 Chronic kidney disease, stage 3b: Secondary | ICD-10-CM

## 2022-03-21 DIAGNOSIS — E1122 Type 2 diabetes mellitus with diabetic chronic kidney disease: Secondary | ICD-10-CM | POA: Diagnosis not present

## 2022-03-21 DIAGNOSIS — N1831 Chronic kidney disease, stage 3a: Secondary | ICD-10-CM

## 2022-03-21 NOTE — Progress Notes (Signed)
?Diabetes Self-Management Education ? ?Visit Type: Follow-up ? ?Appt. Start Time: 1435 Appt. End Time: 3016 ? ?03/21/2022 ? ?[name], identified by name and date of birth, is a 81 y.o. female with a diagnosis of Diabetes:  .  ? ?ASSESSMENT ?Patient is here today alone.  She was last seen by this RD on 12/17/2022 and completed the 3 Diabetes Core classes in 2022. ?She has further questions regarding foods on a diabetic, kidney friendly diet ?She continues to follow a low sodium kidney friendly diabetic diet. ?Dislikes cooking but is eating simply. ?She reports frequent BM's 6-7 times per day and thinks that this is mostly when she includes legumes in her diet but she states that BM's are formed and she would like to continue to eat the beans.  Rare explosive diarrhea. ?Avoids raw onions as they cause her urinary incontinence. ?Does not exercise due to arthritis pain but "wanders" the yard about 8 hours per day with her dog. ? ?History:  Type 2 Diabetes, HTN, CKD stage 3a, hypothyroidism ?A1C:  6.1% 09/13/2021 decreased from 6.6% 04/2021 ?Other labs include:  BUN 28, Creatinine 1.04, Potassium 4.4, GFR 51 ?Medications include:  finerenone ?  ?Weight:  145 lbs 12/17/2021 decreased from 172 lbs in the past 9 months.  She is now at her goal weight.  She gained weight after retirement. ?  ?Blood Glucose: ?            Fasting:  n/a ?            2 hours after starting a meal:  n/a ?            Have you had any low blood sugar readings in the past month?  no ?  ?Social History:  Patient lives alone.  Enjoys walking her dog and spends about 3 hours each day outside daily with the dog, walking and playing.  She also uses a peddler.  She does not cook and eats simple foods.  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  She used to have horses. ?  ?24 hour diet recall: ?Loves sweets but avoids. ?Allergic to black pepper ?Breakfast:  Rice Krispies, blueberries, skim milk  ?Snack:  none ?Lunch:  egg whites,  cheese sandwich on whole grain bread, yogurt, peaches ?Snack:  none ?Dinner:  pears, rice (brown or white), salmon or [place] or tuna (sometimes canned), vegetables ?Snack:  handful of walnuts ?Beverages:  water with diet cranberry juice, diet cranberry juice, occasional 3 oz sparkling white grape juice ? ? Diabetes Self-Management Education - 03/21/22 1843   ? ?  ? Visit Information  ? Visit Type Follow-up   ?  ? Psychosocial Assessment  ? Patient Belief/Attitude about Diabetes Motivated to manage diabetes   ? Self-care barriers None   ? Self-management support Doctor's office;CDE visits   ? Other persons present Patient   ? Patient Concerns Nutrition/Meal planning   ? Special Needs None   ? Preferred Learning Style No preference indicated   ? Learning Readiness Ready   ? How often do you need to have someone help you when you read instructions, pamphlets, or other written materials from your doctor or pharmacy? 1 - Never   ?  ? Pre-Education Assessment  ? Patient understands the diabetes disease and treatment process. Demonstrates understanding / competency   ? Patient understands incorporating nutritional management into lifestyle. Needs Review   ? Patient undertands incorporating physical activity into lifestyle. Demonstrates understanding /  competency   ? Patient understands using medications safely. Demonstrates understanding / competency   ? Patient understands monitoring blood glucose, interpreting and using results Demonstrates understanding / competency   ? Patient understands prevention, detection, and treatment of acute complications. Demonstrates understanding / competency   ? Patient understands prevention, detection, and treatment of chronic complications. Demonstrates understanding / competency   ? Patient understands how to develop strategies to address psychosocial issues. Demonstrates understanding / competency   ? Patient understands how to develop strategies to promote health/change behavior.  Demonstrates understanding / competency   ?  ? Complications  ? How often do you check your blood sugar? Not recommended by provider   ?  ? Patient Education  ? Previous Diabetes Education Yes (please comment)   ? Nutrition management  Meal options for control of blood glucose level and chronic complications.;Other (comment)   low sodium, renal friendly  ? Physical activity and exercise  Helped patient identify appropriate exercises in relation to his/her diabetes, diabetes complications and other health issue.   ? Psychosocial adjustment Identified and addressed patients feelings and concerns about diabetes   ?  ? Individualized Goals (developed by patient)  ? Nutrition General guidelines for healthy choices and portions discussed   ? Physical Activity Exercise 3-5 times per week;15 minutes per day   ? Medications Not Applicable   ? Monitoring  Not Applicable   ?  ? Patient Self-Evaluation of Goals - Patient rates self as meeting previously set goals (% of time)  ? Nutrition >75%   ? Physical Activity >75%   ? Medications >75%   ? Monitoring >75%   ? Problem Solving >75%   ? Reducing Risk >75%   ? Health Coping >75%   ?  ? Post-Education Assessment  ? Patient understands the diabetes disease and treatment process. Demonstrates understanding / competency   ? Patient understands incorporating nutritional management into lifestyle. Needs Review   ? Patient undertands incorporating physical activity into lifestyle. Demonstrates understanding / competency   ? Patient understands using medications safely. Demonstrates understanding / competency   ? Patient understands monitoring blood glucose, interpreting and using results Demonstrates understanding / competency   ? Patient understands prevention, detection, and treatment of acute complications. Demonstrates understanding / competency   ? Patient understands prevention, detection, and treatment of chronic complications. Demonstrates understanding / competency   ?  Patient understands how to develop strategies to address psychosocial issues. Demonstrates understanding / competency   ? Patient understands how to develop strategies to promote health/change behavior. Demonstrates understanding / competency   ?  ? Outcomes  ? Expected Outcomes Demonstrated interest in learning. Expect positive outcomes   ? Future DMSE 3-4 months   ? Program Status Not Completed   ?  ? Subsequent Visit  ? Since your last visit have you continued or begun to take your medications as prescribed? Not on Medications   ? Since your last visit have you experienced any weight changes? Gain   ? Weight Gain (lbs) 1   ? Since your last visit, are you checking your blood glucose at least once a day? N/A   ? ?  ?  ? ?  ? ? ?Individualized Plan for Diabetes Self-Management Training:  ? ?Learning Objective:  Patient will have a greater understanding of diabetes self-management. ?Patient education plan is to attend individual and/or group sessions per assessed needs and concerns. ?  ?Plan:  ? ?Patient Instructions  ?Continue mindfulness ?Continue low fat low sodium  most of the time ?TV dinners are OK if less than 600 mg of sodium ? ?Expected Outcomes:  Demonstrated interest in learning. Expect positive outcomes ? ?Education material provided:  ? ?If problems or questions, patient to contact team via:  Phone ? ?Future DSME appointment: 3-4 months ?  ?

## 2022-03-21 NOTE — Patient Instructions (Signed)
Continue mindfulness ?Continue low fat low sodium most of the time ?TV dinners are OK if less than 600 mg of sodium ?

## 2022-03-29 ENCOUNTER — Other Ambulatory Visit: Payer: Self-pay | Admitting: Internal Medicine

## 2022-03-29 DIAGNOSIS — E039 Hypothyroidism, unspecified: Secondary | ICD-10-CM

## 2022-04-03 DIAGNOSIS — B3789 Other sites of candidiasis: Secondary | ICD-10-CM | POA: Diagnosis not present

## 2022-04-03 DIAGNOSIS — L309 Dermatitis, unspecified: Secondary | ICD-10-CM | POA: Diagnosis not present

## 2022-04-17 DIAGNOSIS — L578 Other skin changes due to chronic exposure to nonionizing radiation: Secondary | ICD-10-CM | POA: Diagnosis not present

## 2022-04-17 DIAGNOSIS — D225 Melanocytic nevi of trunk: Secondary | ICD-10-CM | POA: Diagnosis not present

## 2022-04-17 DIAGNOSIS — L814 Other melanin hyperpigmentation: Secondary | ICD-10-CM | POA: Diagnosis not present

## 2022-04-17 DIAGNOSIS — L309 Dermatitis, unspecified: Secondary | ICD-10-CM | POA: Diagnosis not present

## 2022-04-17 DIAGNOSIS — Z808 Family history of malignant neoplasm of other organs or systems: Secondary | ICD-10-CM | POA: Diagnosis not present

## 2022-04-17 DIAGNOSIS — L308 Other specified dermatitis: Secondary | ICD-10-CM | POA: Diagnosis not present

## 2022-04-17 DIAGNOSIS — L821 Other seborrheic keratosis: Secondary | ICD-10-CM | POA: Diagnosis not present

## 2022-04-17 DIAGNOSIS — L723 Sebaceous cyst: Secondary | ICD-10-CM | POA: Diagnosis not present

## 2022-04-17 DIAGNOSIS — D485 Neoplasm of uncertain behavior of skin: Secondary | ICD-10-CM | POA: Diagnosis not present

## 2022-05-29 DIAGNOSIS — Z6824 Body mass index (BMI) 24.0-24.9, adult: Secondary | ICD-10-CM | POA: Diagnosis not present

## 2022-05-29 DIAGNOSIS — Z01419 Encounter for gynecological examination (general) (routine) without abnormal findings: Secondary | ICD-10-CM | POA: Diagnosis not present

## 2022-05-31 ENCOUNTER — Other Ambulatory Visit: Payer: Self-pay | Admitting: Internal Medicine

## 2022-05-31 DIAGNOSIS — J4520 Mild intermittent asthma, uncomplicated: Secondary | ICD-10-CM

## 2022-05-31 DIAGNOSIS — E785 Hyperlipidemia, unspecified: Secondary | ICD-10-CM

## 2022-05-31 DIAGNOSIS — I10 Essential (primary) hypertension: Secondary | ICD-10-CM

## 2022-06-13 ENCOUNTER — Ambulatory Visit: Payer: Medicare PPO | Admitting: Internal Medicine

## 2022-06-13 ENCOUNTER — Encounter: Payer: Self-pay | Admitting: Internal Medicine

## 2022-06-13 VITALS — BP 134/74 | HR 73 | Temp 98.1°F | Ht 65.0 in | Wt 143.0 lb

## 2022-06-13 DIAGNOSIS — E785 Hyperlipidemia, unspecified: Secondary | ICD-10-CM

## 2022-06-13 DIAGNOSIS — Z0000 Encounter for general adult medical examination without abnormal findings: Secondary | ICD-10-CM | POA: Diagnosis not present

## 2022-06-13 DIAGNOSIS — Z23 Encounter for immunization: Secondary | ICD-10-CM

## 2022-06-13 DIAGNOSIS — N1832 Chronic kidney disease, stage 3b: Secondary | ICD-10-CM | POA: Diagnosis not present

## 2022-06-13 DIAGNOSIS — E781 Pure hyperglyceridemia: Secondary | ICD-10-CM | POA: Diagnosis not present

## 2022-06-13 DIAGNOSIS — E1122 Type 2 diabetes mellitus with diabetic chronic kidney disease: Secondary | ICD-10-CM

## 2022-06-13 DIAGNOSIS — I10 Essential (primary) hypertension: Secondary | ICD-10-CM

## 2022-06-13 DIAGNOSIS — E039 Hypothyroidism, unspecified: Secondary | ICD-10-CM

## 2022-06-13 LAB — BASIC METABOLIC PANEL
BUN: 24 mg/dL — ABNORMAL HIGH (ref 6–23)
CO2: 27 mEq/L (ref 19–32)
Calcium: 10.2 mg/dL (ref 8.4–10.5)
Chloride: 103 mEq/L (ref 96–112)
Creatinine, Ser: 0.97 mg/dL (ref 0.40–1.20)
GFR: 55.94 mL/min — ABNORMAL LOW (ref >60.00)
Glucose, Bld: 105 mg/dL — ABNORMAL HIGH (ref 70–99)
Potassium: 3.9 mEq/L (ref 3.5–5.1)
Sodium: 140 mEq/L (ref 135–145)

## 2022-06-13 LAB — CBC WITH DIFFERENTIAL/PLATELET
Basophils Absolute: 0.1 10*3/uL (ref 0.0–0.1)
Basophils Relative: 1 % (ref 0.0–3.0)
Eosinophils Absolute: 0.3 10*3/uL (ref 0.0–0.7)
Eosinophils Relative: 3.9 % (ref 0.0–5.0)
HCT: 44.5 % (ref 36.0–46.0)
Hemoglobin: 14.9 g/dL (ref 12.0–15.0)
Lymphocytes Relative: 17.4 % (ref 12.0–46.0)
Lymphs Abs: 1.1 10*3/uL (ref 0.7–4.0)
MCHC: 33.5 g/dL (ref 30.0–36.0)
MCV: 88.7 fl (ref 78.0–100.0)
Monocytes Absolute: 0.4 10*3/uL (ref 0.1–1.0)
Monocytes Relative: 6.8 % (ref 3.0–12.0)
Neutro Abs: 4.6 10*3/uL (ref 1.4–7.7)
Neutrophils Relative %: 70.9 % (ref 43.0–77.0)
Platelets: 354 10*3/uL (ref 150.0–400.0)
RBC: 5.02 Mil/uL (ref 3.87–5.11)
RDW: 13.1 % (ref 11.5–15.5)
WBC: 6.5 10*3/uL (ref 4.0–10.5)

## 2022-06-13 LAB — HEPATIC FUNCTION PANEL
ALT: 19 U/L (ref 0–35)
AST: 21 U/L (ref 0–37)
Albumin: 4.4 g/dL (ref 3.5–5.2)
Alkaline Phosphatase: 84 U/L (ref 39–117)
Bilirubin, Direct: 0.1 mg/dL (ref 0.0–0.3)
Total Bilirubin: 0.6 mg/dL (ref 0.2–1.2)
Total Protein: 7.2 g/dL (ref 6.0–8.3)

## 2022-06-13 LAB — LIPID PANEL
Cholesterol: 175 mg/dL (ref 0–200)
HDL: 52.8 mg/dL (ref >39.00)
LDL Cholesterol: 86 mg/dL (ref 0–99)
NonHDL: 121.7
Total CHOL/HDL Ratio: 3
Triglycerides: 179 mg/dL — ABNORMAL HIGH (ref 0.0–149.0)
VLDL: 35.8 mg/dL (ref 0.0–40.0)

## 2022-06-13 LAB — TSH: TSH: 0.15 u[IU]/mL — ABNORMAL LOW (ref 0.35–5.50)

## 2022-06-13 LAB — HEMOGLOBIN A1C: Hgb A1c MFr Bld: 6.1 % (ref 4.6–6.5)

## 2022-06-13 MED ORDER — LEVOTHYROXINE SODIUM 75 MCG PO TABS: 75.0000 ug | ORAL_TABLET | Freq: Every day | ORAL | 1 refills | Status: DC

## 2022-06-13 NOTE — Patient Instructions (Signed)
Preventive Care 65 Years and Older, Female Preventive care refers to lifestyle choices and visits with your health care provider that can promote health and wellness. Preventive care visits are also called wellness exams. What can I expect for my preventive care visit? Counseling Your health care provider may ask you questions about your: Medical history, including: Past medical problems. Family medical history. Pregnancy and menstrual history. History of falls. Current health, including: Memory and ability to understand (cognition). Emotional well-being. Home life and relationship well-being. Sexual activity and sexual health. Lifestyle, including: Alcohol, nicotine or tobacco, and drug use. Access to firearms. Diet, exercise, and sleep habits. Work and work environment. Sunscreen use. Safety issues such as seatbelt and bike helmet use. Physical exam Your health care provider will check your: Height and weight. These may be used to calculate your BMI (body mass index). BMI is a measurement that tells if you are at a healthy weight. Waist circumference. This measures the distance around your waistline. This measurement also tells if you are at a healthy weight and may help predict your risk of certain diseases, such as type 2 diabetes and high blood pressure. Heart rate and blood pressure. Body temperature. Skin for abnormal spots. What immunizations do I need?  Vaccines are usually given at various ages, according to a schedule. Your health care provider will recommend vaccines for you based on your age, medical history, and lifestyle or other factors, such as travel or where you work. What tests do I need? Screening Your health care provider may recommend screening tests for certain conditions. This may include: Lipid and cholesterol levels. Hepatitis C test. Hepatitis B test. HIV (human immunodeficiency virus) test. STI (sexually transmitted infection) testing, if you are at  risk. Lung cancer screening. Colorectal cancer screening. Diabetes screening. This is done by checking your blood sugar (glucose) after you have not eaten for a while (fasting). Mammogram. Talk with your health care provider about how often you should have regular mammograms. BRCA-related cancer screening. This may be done if you have a family history of breast, ovarian, tubal, or peritoneal cancers. Bone density scan. This is done to screen for osteoporosis. Talk with your health care provider about your test results, treatment options, and if necessary, the need for more tests. Follow these instructions at home: Eating and drinking  Eat a diet that includes fresh fruits and vegetables, whole grains, lean protein, and low-fat dairy products. Limit your intake of foods with high amounts of sugar, saturated fats, and salt. Take vitamin and mineral supplements as recommended by your health care provider. Do not drink alcohol if your health care provider tells you not to drink. If you drink alcohol: Limit how much you have to 0-1 drink a day. Know how much alcohol is in your drink. In the [place] one drink equals one 12 oz bottle of beer (355 mL), one 5 oz glass of wine (148 mL), or one 1 oz glass of hard liquor (44 mL). Lifestyle Brush your teeth every morning and night with fluoride toothpaste. Floss one time each day. Exercise for at least 30 minutes 5 or more days each week. Do not use any products that contain nicotine or tobacco. These products include cigarettes, chewing tobacco, and vaping devices, such as e-cigarettes. If you need help quitting, ask your health care provider. Do not use drugs. If you are sexually active, practice safe sex. Use a condom or other form of protection in order to prevent STIs. Take aspirin only as told by   your health care provider. Make sure that you understand how much to take and what form to take. Work with your health care provider to find out whether it  is safe and beneficial for you to take aspirin daily. Ask your health care provider if you need to take a cholesterol-lowering medicine (statin). Find healthy ways to manage stress, such as: Meditation, yoga, or listening to music. Journaling. Talking to a trusted person. Spending time with friends and family. Minimize exposure to UV radiation to reduce your risk of skin cancer. Safety Always wear your seat belt while driving or riding in a vehicle. Do not drive: If you have been drinking alcohol. Do not ride with someone who has been drinking. When you are tired or distracted. While texting. If you have been using any mind-altering substances or drugs. Wear a helmet and other protective equipment during sports activities. If you have firearms in your house, make sure you follow all gun safety procedures. What's next? Visit your health care provider once a year for an annual wellness visit. Ask your health care provider how often you should have your eyes and teeth checked. Stay up to date on all vaccines. This information is not intended to replace advice given to you by your health care provider. Make sure you discuss any questions you have with your health care provider. Document Revised: 06/13/2021 Document Reviewed: 06/13/2021 Elsevier Patient Education  2023 Elsevier Inc.  

## 2022-06-13 NOTE — Progress Notes (Signed)
Subjective:  Patient ID: Natalie Shannon, female    DOB: Aug 01, 1941  Age: 81 y.o. MRN: [id number]  CC: Annual Exam, Hypertension, and Hypothyroidism   HPI Natalie Shannon presents for a CPX and f/up -  She rides a bike for an hour each day.  She does not experience chest pain, shortness of breath, diaphoresis, edema, or fatigue.  Outpatient Medications Prior to Visit  Medication Sig Dispense Refill   albuterol (VENTOLIN HFA) 108 (90 Base) MCG/ACT inhaler Inhale 2 puffs into the lungs every 6 (six) hours as needed for wheezing or shortness of breath. 1 Inhaler 3   atorvastatin (LIPITOR) 10 MG tablet Take 1 tablet (10 mg total) by mouth daily. 90 tablet 1   budesonide (PULMICORT FLEXHALER) 180 MCG/ACT inhaler Inhale 2 puffs into the lungs 2 (two) times daily. 6 each 1   desonide (DESOWEN) 0.05 % ointment APPLY TOPICALLY TO THE AFFECTED AREA TWICE DAILY FOR 14 DAYS. 15 g 2   famotidine (PEPCID) 40 MG tablet Take 1 tablet (40 mg total) by mouth daily. 90 tablet 1   losartan (COZAAR) 100 MG tablet Take 1 tablet (100 mg total) by mouth daily. 90 tablet 1   montelukast (SINGULAIR) 10 MG tablet TAKE ONE TABLET BY MOUTH AT BEDTIME 90 tablet 1   Finerenone (KERENDIA) 20 MG TABS Take 1 tablet by mouth daily. 90 tablet 1   levothyroxine (SYNTHROID) 100 MCG tablet TAKE ONE TABLET BY MOUTH DAILY BEFORE BREAKFAST. 90 tablet 0   No facility-administered medications prior to visit.    ROS Review of Systems  Constitutional: Negative.  Negative for diaphoresis and fatigue.  HENT: Negative.    Eyes: Negative.   Respiratory:  Negative for cough, chest tightness, shortness of breath and wheezing.   Cardiovascular:  Negative for chest pain, palpitations and leg swelling.  Gastrointestinal:  Negative for abdominal pain, diarrhea, nausea and vomiting.  Endocrine: Negative.   Genitourinary: Negative.   Musculoskeletal: Negative.   Skin: Negative.   Neurological:  Negative for dizziness and weakness.   Hematological:  Negative for adenopathy. Does not bruise/bleed easily.  Psychiatric/Behavioral: Negative.      Objective:  BP 134/74 (BP Location: Left Arm, Patient Position: Sitting, Cuff Size: Large)   Pulse 73   Temp 98.1 F (36.7 C) (Oral)   Ht '5\' 5"'$  (1.651 m)   Wt 143 lb (64.9 kg)   SpO2 97%   BMI 23.80 kg/m   BP Readings from Last 3 Encounters:  06/13/22 134/74  01/16/22 120/60  12/13/21 136/82    Wt Readings from Last 3 Encounters:  06/13/22 143 lb (64.9 kg)  01/16/22 144 lb (65.3 kg)  12/13/21 146 lb (66.2 kg)    Physical Exam Vitals reviewed.  HENT:     Nose: Nose normal.     Mouth/Throat:     Mouth: Mucous membranes are moist.  Eyes:     General: No scleral icterus.    Conjunctiva/sclera: Conjunctivae normal.  Cardiovascular:     Rate and Rhythm: Normal rate and regular rhythm.     Heart sounds: No murmur heard. Pulmonary:     Effort: Pulmonary effort is normal.     Breath sounds: No stridor. No wheezing, rhonchi or rales.  Abdominal:     General: Abdomen is flat.     Palpations: There is no mass.     Tenderness: There is no abdominal tenderness. There is no guarding.     Hernia: No hernia is present.  Musculoskeletal:  General: Normal range of motion.     Cervical back: Neck supple.     Right lower leg: No edema.     Left lower leg: No edema.  Lymphadenopathy:     Cervical: No cervical adenopathy.  Skin:    General: Skin is warm and dry.  Neurological:     General: No focal deficit present.     Mental Status: She is alert. Mental status is at baseline.  Psychiatric:        Mood and Affect: Mood normal.        Behavior: Behavior normal.     Lab Results  Component Value Date   WBC 6.5 06/13/2022   HGB 14.9 06/13/2022   HCT 44.5 06/13/2022   PLT 354.0 06/13/2022   GLUCOSE 105 (H) 06/13/2022   CHOL 175 06/13/2022   TRIG 179.0 (H) 06/13/2022   HDL 52.80 06/13/2022   LDLDIRECT 173.0 12/07/2018   LDLCALC 86 06/13/2022   ALT 19  06/13/2022   AST 21 06/13/2022   NA 140 06/13/2022   K 3.9 06/13/2022   CL 103 06/13/2022   CREATININE 0.97 06/13/2022   BUN 24 (H) 06/13/2022   CO2 27 06/13/2022   TSH 0.15 (L) 06/13/2022   INR 1.01 05/15/2012   HGBA1C 6.1 06/13/2022    No results found.  Assessment & Plan:   Natalie Shannon was seen today for annual exam, hypertension and hypothyroidism.  Diagnoses and all orders for this visit:  Essential hypertension- Her blood pressure is well controlled. -     Basic metabolic panel; Future -     CBC with Differential/Platelet; Future -     Cancel: Urinalysis, Routine w reflex microscopic; Future -     Hepatic function panel; Future -     Hepatic function panel -     CBC with Differential/Platelet -     Basic metabolic panel  Acquired hypothyroidism- Her TSH is suppressed.  Will lower her T4 dosage. -     TSH; Future -     TSH -     levothyroxine (SYNTHROID) 75 MCG tablet; Take 1 tablet (75 mcg total) by mouth daily.  Type 2 diabetes mellitus with stage 3b chronic kidney disease, without long-term current use of insulin ([place])- Her blood sugar is well controlled. -     Microalbumin / creatinine urine ratio; Future -     Hemoglobin A1c; Future -     Hemoglobin A1c -     Microalbumin / creatinine urine ratio  Stage 3b chronic kidney disease- Her renal function has improved slightly. -     Microalbumin / creatinine urine ratio; Future -     Cancel: Urinalysis, Routine w reflex microscopic; Future -     Microalbumin / creatinine urine ratio  Hyperlipidemia LDL goal <130- LDL goal achieved. Doing well on the statin  -     Lipid panel; Future -     TSH; Future -     Hepatic function panel; Future -     Hepatic function panel -     TSH -     Lipid panel  Pure hyperglyceridemia -     Lipid panel; Future -     Hepatic function panel; Future -     Hepatic function panel -     Lipid panel  Routine general medical examination at a health care facility- Exam completed, labs  reviewed, vaccines reviewed and updated, no cancer screenings indicated, patient education was given.   I have discontinued [facility]  A. Roulston's levothyroxine. I am also having her start on levothyroxine and Boostrix. Additionally, I am having her maintain her albuterol, Pulmicort Flexhaler, desonide, famotidine, montelukast, atorvastatin, losartan, and Kerendia.  Meds ordered this encounter  Medications   levothyroxine (SYNTHROID) 75 MCG tablet    Sig: Take 1 tablet (75 mcg total) by mouth daily.    Dispense:  90 tablet    Refill:  1   Finerenone (KERENDIA) 20 MG TABS    Sig: Take 1 tablet by mouth daily.    Dispense:  90 tablet    Refill:  1   Tdap (BOOSTRIX) 5-2.5-18.5 LF-MCG/0.5 injection    Sig: Inject 0.5 mLs into the muscle once for 1 dose.    Dispense:  0.5 mL    Refill:  0     Follow-up: Return in about 6 months (around 12/13/2022).  Scarlette Calico, MD

## 2022-06-14 ENCOUNTER — Encounter: Payer: Self-pay | Admitting: Internal Medicine

## 2022-06-14 ENCOUNTER — Other Ambulatory Visit: Payer: Self-pay | Admitting: Internal Medicine

## 2022-06-14 ENCOUNTER — Telehealth: Payer: Self-pay | Admitting: Internal Medicine

## 2022-06-14 DIAGNOSIS — Z23 Encounter for immunization: Secondary | ICD-10-CM

## 2022-06-14 DIAGNOSIS — N1832 Chronic kidney disease, stage 3b: Secondary | ICD-10-CM

## 2022-06-14 MED ORDER — BOOSTRIX 5-2.5-18.5 LF-MCG/0.5 IM SUSP: 0.5000 mL | Freq: Once | INTRAMUSCULAR | 0 refills | Status: DC

## 2022-06-14 MED ORDER — KERENDIA 20 MG PO TABS: 1.0000 | ORAL_TABLET | Freq: Every day | ORAL | 1 refills | Status: DC

## 2022-06-14 MED ORDER — BOOSTRIX 5-2.5-18.5 LF-MCG/0.5 IM SUSP: 0.5000 mL | Freq: Once | INTRAMUSCULAR | 0 refills | Status: AC

## 2022-06-14 NOTE — Telephone Encounter (Signed)
Pt is confused about vaccines ordered at last visit. Please call to clarify.

## 2022-06-14 NOTE — Telephone Encounter (Signed)
Note not needed 

## 2022-06-14 NOTE — Telephone Encounter (Signed)
Pt requested that the tdap vaccine be sent to the [facility] on [address]. Pt stated that [facility] is unable to administer the vaccine. I have sent the vaccine to [facility] as requested.

## 2022-06-27 ENCOUNTER — Encounter: Payer: Self-pay | Admitting: Dietician

## 2022-06-27 ENCOUNTER — Encounter: Payer: Medicare PPO | Attending: Internal Medicine | Admitting: Dietician

## 2022-06-27 DIAGNOSIS — N1832 Chronic kidney disease, stage 3b: Secondary | ICD-10-CM | POA: Diagnosis not present

## 2022-06-27 DIAGNOSIS — E1122 Type 2 diabetes mellitus with diabetic chronic kidney disease: Secondary | ICD-10-CM | POA: Insufficient documentation

## 2022-06-27 NOTE — Progress Notes (Signed)
Diabetes Self-Management Education  Visit Type: Follow-up  Appt. Start Time: 1400 Appt. End Time: 9179  06/28/2022  [name], identified by name and date of birth, is a 81 y.o. female with a diagnosis of Diabetes:  .   ASSESSMENT Patient is here today alone.   She was last seen by this RD 03/21/2022. Her diabetes is well controlled and she is here to discuss her diet to stay on track. Dislikes cooking.  Eats simply and TV dinners at times.  Used to eat all meals out prior to diagnosis. Loves sweets but avoids.  Dislikes water but is drinking.  History:  Type 2 Diabetes, HTN, CKD stage 3a, hypothyroidism A1C:  6.1% 06/13/2022 and 09/13/2021 decreased from 6.6% 04/2021 Other labs include:  BUN 24, Creatinine 0.97, Potassium 3.9, GFR 55 06/13/2022 Medications include:  finerenone   Weight:  145 lbs 12/17/2021 decreased from 172 lbs in the past 9 months.  She is now at her goal weight.  She gained weight after retirement. She is now maintaining her weight at 145 lbs   Social History:  Patient lives alone.  Enjoys walking her dog and spends about 3 hours each day outside daily with the dog, walking and playing.  She also uses a peddler.  She does not cook and eats simple foods.  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  She used to have horses.  Weight 140 lb (63.5 kg). Body mass index is 23.3 kg/m.   Diabetes Self-Management Education - 06/28/22 1626       Visit Information   Visit Type Follow-up      Psychosocial Assessment   Patient Belief/Attitude about Diabetes Motivated to manage diabetes    What is the hardest part about your diabetes right now, causing you the most concern, or is the most worrisome to you about your diabetes?   Making healty food and beverage choices    Self-care barriers None    Other persons present Patient    Special Needs None    Learning Readiness Ready      Pre-Education Assessment   Patient understands the diabetes disease  and treatment process. Demonstrates understanding / competency    Patient understands incorporating nutritional management into lifestyle. Comprehends key points    Patient undertands incorporating physical activity into lifestyle. Demonstrates understanding / competency    Patient understands using medications safely. Demonstrates understanding / competency    Patient understands monitoring blood glucose, interpreting and using results Demonstrates understanding / competency    Patient understands prevention, detection, and treatment of acute complications. Demonstrates understanding / competency    Patient understands prevention, detection, and treatment of chronic complications. Demonstrates understanding / competency    Patient understands how to develop strategies to address psychosocial issues. Demonstrates understanding / competency    Patient understands how to develop strategies to promote health/change behavior. Comprehends key points      Complications   Last HgB A1C per patient/outside source 6.1 %   06/13/2022   How often do you check your blood sugar? 0 times/day (not testing)      Dietary Intake   Breakfast blueberries, rice krispies, milk and occasional cheese on diet toast    Snack (morning) yogurt occasionally    Lunch sandwich and fruit or eggs and diet toast, fruit or LS frozen dinner    Snack (afternoon) raw vegetab;es    Dinner LS frozen dinner    Snack (evening) yogurt    Beverage(s)  diet cranberry juice, water      Activity / Exercise   Activity / Exercise Type Light (walking / raking leaves)    How many days per week do you exercise? 7    How many minutes per day do you exercise? 60    Total minutes per week of exercise 420      Patient Education   Previous Diabetes Education Yes (please comment)   02/2022   Healthy Eating Meal options for control of blood glucose level and chronic complications.    Diabetes Stress and Support Identified and addressed patients  feelings and concerns about diabetes      Individualized Goals (developed by patient)   Nutrition General guidelines for healthy choices and portions discussed    Physical Activity Exercise 5-7 days per week;60 minutes per day    Medications Not Applicable      Patient Self-Evaluation of Goals - Patient rates self as meeting previously set goals (% of time)   Nutrition >75% (most of the time)    Physical Activity >75% (most of the time)    Medications >75% (most of the time)    Monitoring >75% (most of the time)    Problem Solving and behavior change strategies  >75% (most of the time)    Reducing Risk (treating acute and chronic complications) >56% (most of the time)    Health Coping >75% (most of the time)      Post-Education Assessment   Patient understands the diabetes disease and treatment process. Demonstrates understanding / competency    Patient understands incorporating nutritional management into lifestyle. Demonstrates understanding / competency    Patient undertands incorporating physical activity into lifestyle. Demonstrates understanding / competency    Patient understands using medications safely. Demonstrates understanding / competency    Patient understands monitoring blood glucose, interpreting and using results Demonstrates understanding / competency    Patient understands prevention, detection, and treatment of acute complications. Demonstrates understanding / competency    Patient understands prevention, detection, and treatment of chronic complications. Demonstrates understanding / competency    Patient understands how to develop strategies to address psychosocial issues. Demonstrates understanding / competency    Patient understands how to develop strategies to promote health/change behavior. Comprehends key points      Outcomes   Expected Outcomes Demonstrated interest in learning. Expect positive outcomes    Future DMSE 3-4 months    Program Status Not Completed       Subsequent Visit   Since your last visit have you continued or begun to take your medications as prescribed? Not on Medications    Since your last visit have you experienced any weight changes? No change             Individualized Plan for Diabetes Self-Management Training:   Learning Objective:  Patient will have a greater understanding of diabetes self-management. Patient education plan is to attend individual and/or group sessions per assessed needs and concerns.   Plan:   Patient Instructions  Easy meal idea:  Rotisserie chicken, microwaved sweet potato, frozen vegetables  Continue mindfulness Continue low fat low sodium most of the time TV dinners are OK if less than 600 mg of sodium Continue to read labels Eating out occasionally is fine.  Choose restaurants that can cook your food without salt as possible.  Expected Outcomes:  Demonstrated interest in learning. Expect positive outcomes  Education material provided:   If problems or questions, patient to contact team via:  Phone  Future  DSME appointment: 3-4 months

## 2022-06-27 NOTE — Patient Instructions (Addendum)
Easy meal idea:  Rotisserie chicken, microwaved sweet potato, frozen vegetables  Continue mindfulness Continue low fat low sodium most of the time TV dinners are OK if less than 600 mg of sodium Continue to read labels Eating out occasionally is fine.  Choose restaurants that can cook your food without salt as possible.

## 2022-06-28 DIAGNOSIS — R202 Paresthesia of skin: Secondary | ICD-10-CM | POA: Diagnosis not present

## 2022-07-08 ENCOUNTER — Other Ambulatory Visit: Payer: Self-pay | Admitting: Internal Medicine

## 2022-07-08 DIAGNOSIS — J4520 Mild intermittent asthma, uncomplicated: Secondary | ICD-10-CM

## 2022-07-17 ENCOUNTER — Other Ambulatory Visit: Payer: Self-pay | Admitting: Internal Medicine

## 2022-07-17 DIAGNOSIS — K219 Gastro-esophageal reflux disease without esophagitis: Secondary | ICD-10-CM

## 2022-07-31 ENCOUNTER — Ambulatory Visit: Payer: Medicare PPO | Admitting: Podiatry

## 2022-07-31 ENCOUNTER — Encounter: Payer: Self-pay | Admitting: Podiatry

## 2022-07-31 DIAGNOSIS — B351 Tinea unguium: Secondary | ICD-10-CM

## 2022-07-31 DIAGNOSIS — N1832 Chronic kidney disease, stage 3b: Secondary | ICD-10-CM

## 2022-07-31 DIAGNOSIS — M79674 Pain in right toe(s): Secondary | ICD-10-CM | POA: Diagnosis not present

## 2022-07-31 DIAGNOSIS — M79675 Pain in left toe(s): Secondary | ICD-10-CM | POA: Diagnosis not present

## 2022-07-31 DIAGNOSIS — E1122 Type 2 diabetes mellitus with diabetic chronic kidney disease: Secondary | ICD-10-CM | POA: Diagnosis not present

## 2022-07-31 DIAGNOSIS — L84 Corns and callosities: Secondary | ICD-10-CM | POA: Diagnosis not present

## 2022-07-31 NOTE — Progress Notes (Signed)
  Subjective:  Patient ID: Natalie Shannon, female    DOB: 25-Nov-1941,   MRN: [id number]  Chief Complaint  Patient presents with   Diabetes    Diabetic foot care  Last A1C -6.5    81 y.o. female presents for concern of thickened elongated and painful nails that are difficult to trim. Requesting to have them trimmed today.  Requesting to have calluses trimmed as well. Relates burning and tingling in their feet. Patient is diabetic and last A1c was  Lab Results  Component Value Date   HGBA1C 6.1 06/13/2022   .   PCP:  Janith Lima, MD    . Denies any other pedal complaints. Denies n/v/f/c.   Past Medical History:  Diagnosis Date   Asthma    Diabetes mellitus without complication ([place])    GERD (gastroesophageal reflux disease)    History of vertebral fracture 2013   Hyperlipidemia    Hypertension    Pneumothorax, traumatic 1993   rib fracture x4 (thrown from horse)   Urticaria    ? solar induced    Objective:  Physical Exam: Vascular: DP/PT pulses 2/4 bilateral. CFT <3 seconds. Absent hair growth on digits. Edema noted to bilateral lower extremities. Xerosis noted bilaterally.  Skin. No lacerations or abrasions bilateral feet. Nails 1-5 bilateral  are thickened discolored and elongated with subungual debris. Hyperkeratotic tissue noted sub right hallux as well as bilateral fifth metatarsals . Musculoskeletal: MMT 5/5 bilateral lower extremities in DF, PF, Inversion and Eversion. Deceased ROM in DF of ankle joint.  Neurological: Sensation intact to light touch. Protective sensation intact bilateral.     Assessment:   1. Pain due to onychomycosis of toenails of both feet   2. Type 2 diabetes mellitus with stage 3b chronic kidney disease, without long-term current use of insulin ([place])      Plan:  Patient was evaluated and treated and all questions answered. -Discussed and educated patient on diabetic foot care, especially with  regards to the vascular, neurological  and musculoskeletal systems.  -Stressed the importance of good glycemic control and the detriment of not  controlling glucose levels in relation to the foot. -Discussed supportive shoes at all times and checking feet regularly.  -Mechanically debrided all nails 1-5 bilateral using sterile nail nipper and filed with dremel without incident  Hyperkeratotic tissue debrided as well without incident with chisel.  -Answered all patient questions -Patient to return  in 3 months for at risk foot care -Patient advised to call the office if any problems or questions arise in the meantime.   Lorenda Peck, DPM

## 2022-09-16 ENCOUNTER — Telehealth: Payer: Self-pay | Admitting: Internal Medicine

## 2022-09-16 NOTE — Telephone Encounter (Signed)
Per referral note:  ----- Message ----- From: Carlynn Purl Sent: 09/16/2022   1:02 PM EDT To: Vivi Martens Subject: RE: Out of referral visits                      This needs to go to the doctor. He needs to place new referral ----- Message ----- From: Vivi Martens Sent: 09/16/2022  11:56 AM EDT To: Carlynn Purl Subject: Out of referral visits                          Hello,    Pt called and states her referral has run out of visits and is asking that we either add more to her referral, or submit a new referral.

## 2022-09-16 NOTE — Telephone Encounter (Signed)
Pt called and states her referral has run out of visits and is asking that we either add more to her referral, or submit a new referral.   Referral # [id number]

## 2022-09-17 ENCOUNTER — Other Ambulatory Visit: Payer: Self-pay | Admitting: Internal Medicine

## 2022-09-17 DIAGNOSIS — I10 Essential (primary) hypertension: Secondary | ICD-10-CM

## 2022-09-17 DIAGNOSIS — N1832 Chronic kidney disease, stage 3b: Secondary | ICD-10-CM

## 2022-11-14 ENCOUNTER — Ambulatory Visit: Payer: Medicare PPO | Admitting: Podiatry

## 2022-12-02 ENCOUNTER — Ambulatory Visit: Payer: Medicare PPO | Admitting: Podiatry

## 2022-12-02 ENCOUNTER — Other Ambulatory Visit: Payer: Self-pay | Admitting: Internal Medicine

## 2022-12-02 DIAGNOSIS — M21621 Bunionette of right foot: Secondary | ICD-10-CM | POA: Diagnosis not present

## 2022-12-02 DIAGNOSIS — E785 Hyperlipidemia, unspecified: Secondary | ICD-10-CM

## 2022-12-02 DIAGNOSIS — N1832 Chronic kidney disease, stage 3b: Secondary | ICD-10-CM | POA: Diagnosis not present

## 2022-12-02 DIAGNOSIS — M21622 Bunionette of left foot: Secondary | ICD-10-CM | POA: Diagnosis not present

## 2022-12-02 DIAGNOSIS — L84 Corns and callosities: Secondary | ICD-10-CM

## 2022-12-02 DIAGNOSIS — B351 Tinea unguium: Secondary | ICD-10-CM | POA: Diagnosis not present

## 2022-12-02 DIAGNOSIS — M79674 Pain in right toe(s): Secondary | ICD-10-CM

## 2022-12-02 DIAGNOSIS — J4520 Mild intermittent asthma, uncomplicated: Secondary | ICD-10-CM

## 2022-12-02 DIAGNOSIS — M79675 Pain in left toe(s): Secondary | ICD-10-CM

## 2022-12-02 DIAGNOSIS — E1122 Type 2 diabetes mellitus with diabetic chronic kidney disease: Secondary | ICD-10-CM | POA: Diagnosis not present

## 2022-12-02 DIAGNOSIS — I10 Essential (primary) hypertension: Secondary | ICD-10-CM

## 2022-12-02 NOTE — Progress Notes (Signed)
  Subjective:  Patient ID: Natalie Shannon, female    DOB: May 12, 1941,  MRN: [id number]  Chief Complaint  Patient presents with   Diabetic Ulcer    3 months follow up nail trim, wound check.    81 y.o. female presents with the above complaint. History confirmed with patient.  She presents for follow-up today, her nails are getting thick and elongated and the corners are causing discomfort as well with the thickness and brittleness and discoloration.  She also has painful calluses on the outside of each foot and around the big toe.  She says the bumps on the outside of the foot hurt in shoes especially in the winter when she is in close shoes.  Objective:  Physical Exam: warm, good capillary refill, no trophic changes or ulcerative lesions, normal DP and PT pulses, and normal sensory exam.  Bilateral she has thickened elongated white brittle yellow discolored nails as well as hyperkeratotic lesions preulcerative calluses present on the medial hallux bilaterally, plantar right hallux and submetatarsal 5 bilaterally, tailor's bunion deformity noted with prominence of the fifth metatarsal head with tenderness here  Assessment:   1. Pain due to onychomycosis of toenails of both feet   2. Type 2 diabetes mellitus with stage 3b chronic kidney disease, without long-term current use of insulin ([facility])   3. Callus of foot   4. Tailor's bunion of both feet      Plan:  Patient was evaluated and treated and all questions answered.  Patient educated on diabetes. Discussed proper diabetic foot care and discussed risks and complications of disease. Educated patient in depth on reasons to return to the office immediately should he/she discover anything concerning or new on the feet. All questions answered. Discussed proper shoes as well.   All symptomatic hyperkeratoses were safely debrided with a sterile #15 blade to patient's level of comfort without incident. We discussed preventative and palliative  care of these lesions including supportive and accommodative shoegear, padding, prefabricated and custom molded accommodative orthoses, use of a pumice stone and lotions/creams daily.  Discussed the etiology and treatment options for the condition in detail with the patient. Educated patient on the topical and oral treatment options for mycotic nails. Recommended debridement of the nails today. Sharp and mechanical debridement performed of all painful and mycotic nails today. Nails debrided in length and thickness using a nail nipper to level of comfort. Discussed treatment options including appropriate shoe gear. Follow up as needed for painful nails.   We also discussed the bumps on the outside the foot which are tailor's bunions.  We discussed that surgical and nonsurgical treatment could be taken, surgically could consider osteotomy.  She would prefer to avoid surgery.  I recommended nonoperative treatment in this case and silicone bunion pads were dispensed at this point.  Return in about 3 months (around 03/03/2023) for at risk diabetic foot care.

## 2022-12-16 ENCOUNTER — Other Ambulatory Visit: Payer: Self-pay | Admitting: Internal Medicine

## 2022-12-16 ENCOUNTER — Encounter: Payer: Self-pay | Admitting: Internal Medicine

## 2022-12-16 ENCOUNTER — Ambulatory Visit: Payer: Medicare PPO | Admitting: Internal Medicine

## 2022-12-16 VITALS — BP 140/78 | HR 79 | Temp 97.7°F | Resp 16 | Ht 65.0 in | Wt 143.8 lb

## 2022-12-16 DIAGNOSIS — R7303 Prediabetes: Secondary | ICD-10-CM | POA: Diagnosis not present

## 2022-12-16 DIAGNOSIS — E039 Hypothyroidism, unspecified: Secondary | ICD-10-CM | POA: Diagnosis not present

## 2022-12-16 DIAGNOSIS — Z23 Encounter for immunization: Secondary | ICD-10-CM

## 2022-12-16 DIAGNOSIS — I10 Essential (primary) hypertension: Secondary | ICD-10-CM | POA: Diagnosis not present

## 2022-12-16 DIAGNOSIS — N1832 Chronic kidney disease, stage 3b: Secondary | ICD-10-CM

## 2022-12-16 DIAGNOSIS — E1122 Type 2 diabetes mellitus with diabetic chronic kidney disease: Secondary | ICD-10-CM | POA: Diagnosis not present

## 2022-12-16 LAB — CBC WITH DIFFERENTIAL/PLATELET
Basophils Absolute: 0.1 10*3/uL (ref 0.0–0.1)
Basophils Relative: 0.7 % (ref 0.0–3.0)
Eosinophils Absolute: 0.3 10*3/uL (ref 0.0–0.7)
Eosinophils Relative: 3.5 % (ref 0.0–5.0)
HCT: 44.4 % (ref 36.0–46.0)
Hemoglobin: 15.1 g/dL — ABNORMAL HIGH (ref 12.0–15.0)
Lymphocytes Relative: 13.7 % (ref 12.0–46.0)
Lymphs Abs: 1.1 10*3/uL (ref 0.7–4.0)
MCHC: 34 g/dL (ref 30.0–36.0)
MCV: 88.7 fl (ref 78.0–100.0)
Monocytes Absolute: 0.5 10*3/uL (ref 0.1–1.0)
Monocytes Relative: 6.3 % (ref 3.0–12.0)
Neutro Abs: 6 10*3/uL (ref 1.4–7.7)
Neutrophils Relative %: 75.8 % (ref 43.0–77.0)
Platelets: 339 10*3/uL (ref 150.0–400.0)
RBC: 5.01 Mil/uL (ref 3.87–5.11)
RDW: 13.3 % (ref 11.5–15.5)
WBC: 7.9 10*3/uL (ref 4.0–10.5)

## 2022-12-16 LAB — BASIC METABOLIC PANEL
BUN: 27 mg/dL — ABNORMAL HIGH (ref 6–23)
CO2: 28 mEq/L (ref 19–32)
Calcium: 10.2 mg/dL (ref 8.4–10.5)
Chloride: 103 mEq/L (ref 96–112)
Creatinine, Ser: 1.14 mg/dL (ref 0.40–1.20)
GFR: 45.92 mL/min — ABNORMAL LOW (ref >60.00)
Glucose, Bld: 117 mg/dL — ABNORMAL HIGH (ref 70–99)
Potassium: 4.3 mEq/L (ref 3.5–5.1)
Sodium: 140 mEq/L (ref 135–145)

## 2022-12-16 LAB — TSH: TSH: 2.3 u[IU]/mL (ref 0.35–5.50)

## 2022-12-16 LAB — HEMOGLOBIN A1C: Hgb A1c MFr Bld: 6.1 % (ref 4.6–6.5)

## 2022-12-16 MED ORDER — KERENDIA 20 MG PO TABS: 1.0000 | ORAL_TABLET | Freq: Every day | ORAL | 1 refills | Status: DC

## 2022-12-16 NOTE — Patient Instructions (Signed)
Hypertension, Adult High blood pressure (hypertension) is when the force of blood pumping through the arteries is too strong. The arteries are the blood vessels that carry blood from the heart throughout the body. Hypertension forces the heart to work harder to pump blood and may cause arteries to become narrow or stiff. Untreated or uncontrolled hypertension can lead to a heart attack, heart failure, a stroke, kidney disease, and other problems. A blood pressure reading consists of a higher number over a lower number. Ideally, your blood pressure should be below 120/80. The first ("top") number is called the systolic pressure. It is a measure of the pressure in your arteries as your heart beats. The second ("bottom") number is called the diastolic pressure. It is a measure of the pressure in your arteries as the heart relaxes. What are the causes? The exact cause of this condition is not known. There are some conditions that result in high blood pressure. What increases the risk? Certain factors may make you more likely to develop high blood pressure. Some of these risk factors are under your control, including: Smoking. Not getting enough exercise or physical activity. Being overweight. Having too much fat, sugar, calories, or salt (sodium) in your diet. Drinking too much alcohol. Other risk factors include: Having a personal history of heart disease, diabetes, high cholesterol, or kidney disease. Stress. Having a family history of high blood pressure and high cholesterol. Having obstructive sleep apnea. Age. The risk increases with age. What are the signs or symptoms? High blood pressure may not cause symptoms. Very high blood pressure (hypertensive crisis) may cause: Headache. Fast or irregular heartbeats (palpitations). Shortness of breath. Nosebleed. Nausea and vomiting. Vision changes. Severe chest pain, dizziness, and seizures. How is this diagnosed? This condition is diagnosed by  measuring your blood pressure while you are seated, with your arm resting on a flat surface, your legs uncrossed, and your feet flat on the floor. The cuff of the blood pressure monitor will be placed directly against the skin of your upper arm at the level of your heart. Blood pressure should be measured at least twice using the same arm. Certain conditions can cause a difference in blood pressure between your right and left arms. If you have a high blood pressure reading during one visit or you have normal blood pressure with other risk factors, you may be asked to: Return on a different day to have your blood pressure checked again. Monitor your blood pressure at home for 1 week or longer. If you are diagnosed with hypertension, you may have other blood or imaging tests to help your health care provider understand your overall risk for other conditions. How is this treated? This condition is treated by making healthy lifestyle changes, such as eating healthy foods, exercising more, and reducing your alcohol intake. You may be referred for counseling on a healthy diet and physical activity. Your health care provider may prescribe medicine if lifestyle changes are not enough to get your blood pressure under control and if: Your systolic blood pressure is above 130. Your diastolic blood pressure is above 80. Your personal target blood pressure may vary depending on your medical conditions, your age, and other factors. Follow these instructions at home: Eating and drinking  Eat a diet that is high in fiber and potassium, and low in sodium, added sugar, and fat. An example of this eating plan is called the DASH diet. DASH stands for Dietary Approaches to Stop Hypertension. To eat this way: Eat   plenty of fresh fruits and vegetables. Try to fill one half of your plate at each meal with fruits and vegetables. Eat whole grains, such as whole-wheat pasta, brown rice, or whole-grain bread. Fill about one  fourth of your plate with whole grains. Eat or drink low-fat dairy products, such as skim milk or low-fat yogurt. Avoid fatty cuts of meat, processed or cured meats, and poultry with skin. Fill about one fourth of your plate with lean proteins, such as fish, chicken without skin, beans, eggs, or tofu. Avoid pre-made and processed foods. These tend to be higher in sodium, added sugar, and fat. Reduce your daily sodium intake. Many people with hypertension should eat less than 1,500 mg of sodium a day. Do not drink alcohol if: Your health care provider tells you not to drink. You are pregnant, may be pregnant, or are planning to become pregnant. If you drink alcohol: Limit how much you have to: 0-1 drink a day for women. 0-2 drinks a day for men. Know how much alcohol is in your drink. In the [place] one drink equals one 12 oz bottle of beer (355 mL), one 5 oz glass of wine (148 mL), or one 1 oz glass of hard liquor (44 mL). Lifestyle  Work with your health care provider to maintain a healthy body weight or to lose weight. Ask what an ideal weight is for you. Get at least 30 minutes of exercise that causes your heart to beat faster (aerobic exercise) most days of the week. Activities may include walking, swimming, or biking. Include exercise to strengthen your muscles (resistance exercise), such as Pilates or lifting weights, as part of your weekly exercise routine. Try to do these types of exercises for 30 minutes at least 3 days a week. Do not use any products that contain nicotine or tobacco. These products include cigarettes, chewing tobacco, and vaping devices, such as e-cigarettes. If you need help quitting, ask your health care provider. Monitor your blood pressure at home as told by your health care provider. Keep all follow-up visits. This is important. Medicines Take over-the-counter and prescription medicines only as told by your health care provider. Follow directions carefully. Blood  pressure medicines must be taken as prescribed. Do not skip doses of blood pressure medicine. Doing this puts you at risk for problems and can make the medicine less effective. Ask your health care provider about side effects or reactions to medicines that you should watch for. Contact a health care provider if you: Think you are having a reaction to a medicine you are taking. Have headaches that keep coming back (recurring). Feel dizzy. Have swelling in your ankles. Have trouble with your vision. Get help right away if you: Develop a severe headache or confusion. Have unusual weakness or numbness. Feel faint. Have severe pain in your chest or abdomen. Vomit repeatedly. Have trouble breathing. These symptoms may be an emergency. Get help right away. Call 911. Do not wait to see if the symptoms will go away. Do not drive yourself to the hospital. Summary Hypertension is when the force of blood pumping through your arteries is too strong. If this condition is not controlled, it may put you at risk for serious complications. Your personal target blood pressure may vary depending on your medical conditions, your age, and other factors. For most people, a normal blood pressure is less than 120/80. Hypertension is treated with lifestyle changes, medicines, or a combination of both. Lifestyle changes include losing weight, eating a healthy,   low-sodium diet, exercising more, and limiting alcohol. This information is not intended to replace advice given to you by your health care provider. Make sure you discuss any questions you have with your health care provider. Document Revised: 10/23/2021 Document Reviewed: 10/23/2021 Elsevier Patient Education  2023 [employer]  

## 2022-12-16 NOTE — Progress Notes (Signed)
Subjective:  Patient ID: Natalie Shannon, female    DOB: 04-13-1941  Age: 81 y.o. MRN: [id number]  CC: Hypertension and Hypothyroidism   HPI LADEAN STEINMEYER presents for f/up -  She exercises about an hour a day.  Her endurance is good.  She denies chest pain, shortness of breath, diaphoresis, or edema.  Outpatient Medications Prior to Visit  Medication Sig Dispense Refill   albuterol (VENTOLIN HFA) 108 (90 Base) MCG/ACT inhaler Inhale 2 puffs into the lungs every 6 (six) hours as needed for wheezing or shortness of breath. 1 Inhaler 3   atorvastatin (LIPITOR) 10 MG tablet Take 1 tablet (10 mg total) by mouth daily. 90 tablet 0   augmented betamethasone dipropionate (DIPROLENE-AF) 0.05 % cream Apply topically daily.     cycloSPORINE (RESTASIS) 0.05 % ophthalmic emulsion INSTILL ONE DROP IN EACH EYE TWICE DAILY     desonide (DESOWEN) 0.05 % ointment APPLY TOPICALLY TO THE AFFECTED AREA TWICE DAILY FOR 14 DAYS. 15 g 2   famotidine (PEPCID) 40 MG tablet TAKE ONE TABLET BY MOUTH DAILY 90 tablet 1   levothyroxine (SYNTHROID) 75 MCG tablet Take 1 tablet (75 mcg total) by mouth daily. 90 tablet 1   losartan (COZAAR) 100 MG tablet Take 1 tablet (100 mg total) by mouth daily. 90 tablet 0   montelukast (SINGULAIR) 10 MG tablet TAKE ONE TABLET BY MOUTH AT BEDTIME 90 tablet 0   PULMICORT FLEXHALER 180 MCG/ACT inhaler Inhale 2 puffs into the lungs 2 (two) times daily. 6 each 1   Finerenone (KERENDIA) 20 MG TABS Take 1 tablet by mouth daily. 90 tablet 1   No facility-administered medications prior to visit.    ROS Review of Systems  Constitutional: Negative.  Negative for chills, diaphoresis, fatigue and fever.  Eyes: Negative.   Respiratory:  Negative for cough, chest tightness, shortness of breath and wheezing.   Cardiovascular:  Negative for chest pain, palpitations and leg swelling.  Gastrointestinal:  Negative for abdominal pain, diarrhea, nausea and vomiting.  Endocrine: Negative.    Genitourinary: Negative.  Negative for difficulty urinating.  Musculoskeletal: Negative.  Negative for arthralgias.  Skin: Negative.  Negative for pallor.  Neurological: Negative.   Hematological:  Negative for adenopathy. Does not bruise/bleed easily.  Psychiatric/Behavioral: Negative.      Objective:  BP (!) 140/78 (BP Location: Left Arm, Patient Position: Sitting, Cuff Size: Normal)   Pulse 79   Temp 97.7 F (36.5 C) (Oral)   Resp 16   Ht '5\' 5"'$  (1.651 m)   Wt 143 lb 12.8 oz (65.2 kg)   SpO2 97%   BMI 23.93 kg/m   BP Readings from Last 3 Encounters:  12/16/22 (!) 140/78  06/13/22 134/74  01/16/22 120/60    Wt Readings from Last 3 Encounters:  12/16/22 143 lb 12.8 oz (65.2 kg)  06/28/22 140 lb (63.5 kg)  06/13/22 143 lb (64.9 kg)    Physical Exam Vitals reviewed.  HENT:     Nose: Nose normal.     Mouth/Throat:     Mouth: Mucous membranes are moist.  Eyes:     General: No scleral icterus.    Conjunctiva/sclera: Conjunctivae normal.  Cardiovascular:     Rate and Rhythm: Normal rate and regular rhythm.     Heart sounds: No murmur heard. Pulmonary:     Effort: Pulmonary effort is normal.     Breath sounds: No stridor. No wheezing, rhonchi or rales.  Abdominal:     General: Abdomen is flat.  Palpations: There is no mass.     Tenderness: There is no abdominal tenderness. There is no guarding.     Hernia: No hernia is present.  Musculoskeletal:        General: Normal range of motion.     Cervical back: Neck supple.     Right lower leg: No edema.     Left lower leg: No edema.  Lymphadenopathy:     Cervical: No cervical adenopathy.  Skin:    General: Skin is warm and dry.  Neurological:     General: No focal deficit present.     Mental Status: Mental status is at baseline.  Psychiatric:        Mood and Affect: Mood normal.        Behavior: Behavior normal.     Lab Results  Component Value Date   WBC 7.9 12/16/2022   HGB 15.1 (H) 12/16/2022   HCT  44.4 12/16/2022   PLT 339.0 12/16/2022   GLUCOSE 117 (H) 12/16/2022   CHOL 175 06/13/2022   TRIG 179.0 (H) 06/13/2022   HDL 52.80 06/13/2022   LDLDIRECT 173.0 12/07/2018   LDLCALC 86 06/13/2022   ALT 19 06/13/2022   AST 21 06/13/2022   NA 140 12/16/2022   K 4.3 12/16/2022   CL 103 12/16/2022   CREATININE 1.14 12/16/2022   BUN 27 (H) 12/16/2022   CO2 28 12/16/2022   TSH 2.30 12/16/2022   INR 1.01 05/15/2012   HGBA1C 6.1 12/16/2022    No results found.  Assessment & Plan:   Kenzlie was seen today for hypertension and hypothyroidism.  Diagnoses and all orders for this visit:  Essential hypertension- Her blood pressure is well-controlled. -     CBC with Differential/Platelet; Future -     TSH; Future -     TSH -     CBC with Differential/Platelet  Acquired hypothyroidism- She is euthyroid. -     TSH; Future -     TSH  Stage 3b chronic kidney disease- Her renal function is stable.  Will continue the MRA. -     Basic metabolic panel; Future -     Basic metabolic panel -     Finerenone (KERENDIA) 20 MG TABS; Take 1 tablet by mouth daily.  Prediabetes -     Hemoglobin A1c; Future -     Hemoglobin A1c  Type 2 diabetes mellitus with stage 3b chronic kidney disease, without long-term current use of insulin (HCC) -     Finerenone (KERENDIA) 20 MG TABS; Take 1 tablet by mouth daily.  Other orders -     Pneumococcal polysaccharide vaccine 23-valent greater than or equal to 2yo subcutaneous/IM   I am having Kyrstan A. Millikan maintain her albuterol, desonide, levothyroxine, Pulmicort Flexhaler, famotidine, atorvastatin, montelukast, losartan, cycloSPORINE, augmented betamethasone dipropionate, and Kerendia.  Meds ordered this encounter  Medications   Finerenone (KERENDIA) 20 MG TABS    Sig: Take 1 tablet by mouth daily.    Dispense:  90 tablet    Refill:  1     Follow-up: Return in about 6 months (around 06/17/2023).  Scarlette Calico, MD

## 2022-12-17 ENCOUNTER — Encounter: Payer: Self-pay | Admitting: Internal Medicine

## 2022-12-18 LAB — HM DIABETES EYE EXAM

## 2022-12-25 DIAGNOSIS — Z1231 Encounter for screening mammogram for malignant neoplasm of breast: Secondary | ICD-10-CM | POA: Diagnosis not present

## 2022-12-26 ENCOUNTER — Other Ambulatory Visit: Payer: Self-pay | Admitting: Internal Medicine

## 2022-12-26 DIAGNOSIS — E039 Hypothyroidism, unspecified: Secondary | ICD-10-CM

## 2023-01-09 ENCOUNTER — Ambulatory Visit: Payer: Medicare PPO | Admitting: Dietician

## 2023-01-24 ENCOUNTER — Other Ambulatory Visit: Payer: Self-pay | Admitting: Internal Medicine

## 2023-01-24 DIAGNOSIS — K219 Gastro-esophageal reflux disease without esophagitis: Secondary | ICD-10-CM

## 2023-01-28 ENCOUNTER — Ambulatory Visit (INDEPENDENT_AMBULATORY_CARE_PROVIDER_SITE_OTHER): Payer: Medicare PPO

## 2023-01-28 VITALS — Ht 65.0 in | Wt 143.0 lb

## 2023-01-28 DIAGNOSIS — Z0000 Encounter for general adult medical examination without abnormal findings: Secondary | ICD-10-CM | POA: Diagnosis not present

## 2023-01-28 NOTE — Patient Instructions (Signed)
Natalie Shannon , Thank you for taking time to come for your Medicare Wellness Visit. I appreciate your ongoing commitment to your health goals. Please review the following plan we discussed and let me know if I can assist you in the future.   These are the goals we discussed:  Goals       Patient Stated (pt-stated)      Yes; to get rid of those 2 pounds and continue working with nutritionist.         This is a list of the screening recommended for you and due dates:  Health Maintenance  Topic Date Due   Yearly kidney health urinalysis for diabetes  Never done   DTaP/Tdap/Td vaccine (4 - Td or Tdap) 08/10/2022   COVID-19 Vaccine (4 - 2023-24 season) 08/30/2022   Complete foot exam   09/13/2022   Eye exam for diabetics  12/20/2022   Hemoglobin A1C  06/17/2023   Yearly kidney function blood test for diabetes  12/17/2023   Medicare Annual Wellness Visit  01/29/2024   Pneumonia Vaccine  Completed   Flu Shot  Completed   DEXA scan (bone density measurement)  Completed   Hepatitis C Screening: USPSTF Recommendation to screen - Ages 20-79 yo.  Completed   Zoster (Shingles) Vaccine  Completed   HPV Vaccine  Aged Out    Advanced directives: yes  Conditions/risks identified: none   Next appointment: Follow up in one year for your annual wellness visit 02/03/2024 '@3pm'$ /telephone   Preventive Care 48 Years and Older, Female Preventive care refers to lifestyle choices and visits with your health care provider that can promote health and wellness. What does preventive care include? A yearly physical exam. This is also called an annual well check. Dental exams once or twice a year. Routine eye exams. Ask your health care provider how often you should have your eyes checked. Personal lifestyle choices, including: Daily care of your teeth and gums. Regular physical activity. Eating a healthy diet. Avoiding tobacco and drug use. Limiting alcohol use. Practicing safe sex. Taking low-dose  aspirin every day. Taking vitamin and mineral supplements as recommended by your health care provider. What happens during an annual well check? The services and screenings done by your health care provider during your annual well check will depend on your age, overall health, lifestyle risk factors, and family history of disease. Counseling  Your health care provider may ask you questions about your: Alcohol use. Tobacco use. Drug use. Emotional well-being. Home and relationship well-being. Sexual activity. Eating habits. History of falls. Memory and ability to understand (cognition). Work and work Statistician. Reproductive health. Screening  You may have the following tests or measurements: Height, weight, and BMI. Blood pressure. Lipid and cholesterol levels. These may be checked every 5 years, or more frequently if you are over 71 years old. Skin check. Lung cancer screening. You may have this screening every year starting at age 37 if you have a 30-pack-year history of smoking and currently smoke or have quit within the past 15 years. Fecal occult blood test (FOBT) of the stool. You may have this test every year starting at age 63. Flexible sigmoidoscopy or colonoscopy. You may have a sigmoidoscopy every 5 years or a colonoscopy every 10 years starting at age 58. Hepatitis C blood test. Hepatitis B blood test. Sexually transmitted disease (STD) testing. Diabetes screening. This is done by checking your blood sugar (glucose) after you have not eaten for a while (fasting). You may have this  done every 1-3 years. Bone density scan. This is done to screen for osteoporosis. You may have this done starting at age 76. Mammogram. This may be done every 1-2 years. Talk to your health care provider about how often you should have regular mammograms. Talk with your health care provider about your test results, treatment options, and if necessary, the need for more tests. Vaccines  Your  health care provider may recommend certain vaccines, such as: Influenza vaccine. This is recommended every year. Tetanus, diphtheria, and acellular pertussis (Tdap, Td) vaccine. You may need a Td booster every 10 years. Zoster vaccine. You may need this after age 62. Pneumococcal 13-valent conjugate (PCV13) vaccine. One dose is recommended after age 53. Pneumococcal polysaccharide (PPSV23) vaccine. One dose is recommended after age 74. Talk to your health care provider about which screenings and vaccines you need and how often you need them. This information is not intended to replace advice given to you by your health care provider. Make sure you discuss any questions you have with your health care provider. Document Released: 01/12/2016 Document Revised: 09/04/2016 Document Reviewed: 10/17/2015 Elsevier Interactive Patient Education  2017 [place] Prevention in the Home Falls can cause injuries. They can happen to people of all ages. There are many things you can do to make your home safe and to help prevent falls. What can I do on the outside of my home? Regularly fix the edges of walkways and driveways and fix any cracks. Remove anything that might make you trip as you walk through a door, such as a raised step or threshold. Trim any bushes or trees on the path to your home. Use bright outdoor lighting. Clear any walking paths of anything that might make someone trip, such as rocks or tools. Regularly check to see if handrails are loose or broken. Make sure that both sides of any steps have handrails. Any raised decks and porches should have guardrails on the edges. Have any leaves, snow, or ice cleared regularly. Use sand or salt on walking paths during winter. Clean up any spills in your garage right away. This includes oil or grease spills. What can I do in the bathroom? Use night lights. Install grab bars by the toilet and in the tub and shower. Do not use towel bars as  grab bars. Use non-skid mats or decals in the tub or shower. If you need to sit down in the shower, use a plastic, non-slip stool. Keep the floor dry. Clean up any water that spills on the floor as soon as it happens. Remove soap buildup in the tub or shower regularly. Attach bath mats securely with double-sided non-slip rug tape. Do not have throw rugs and other things on the floor that can make you trip. What can I do in the bedroom? Use night lights. Make sure that you have a light by your bed that is easy to reach. Do not use any sheets or blankets that are too big for your bed. They should not hang down onto the floor. Have a firm chair that has side arms. You can use this for support while you get dressed. Do not have throw rugs and other things on the floor that can make you trip. What can I do in the kitchen? Clean up any spills right away. Avoid walking on wet floors. Keep items that you use a lot in easy-to-reach places. If you need to reach something above you, use a strong step stool that  has a grab bar. Keep electrical cords out of the way. Do not use floor polish or wax that makes floors slippery. If you must use wax, use non-skid floor wax. Do not have throw rugs and other things on the floor that can make you trip. What can I do with my stairs? Do not leave any items on the stairs. Make sure that there are handrails on both sides of the stairs and use them. Fix handrails that are broken or loose. Make sure that handrails are as long as the stairways. Check any carpeting to make sure that it is firmly attached to the stairs. Fix any carpet that is loose or worn. Avoid having throw rugs at the top or bottom of the stairs. If you do have throw rugs, attach them to the floor with carpet tape. Make sure that you have a light switch at the top of the stairs and the bottom of the stairs. If you do not have them, ask someone to add them for you. What else can I do to help prevent  falls? Wear shoes that: Do not have high heels. Have rubber bottoms. Are comfortable and fit you well. Are closed at the toe. Do not wear sandals. If you use a stepladder: Make sure that it is fully opened. Do not climb a closed stepladder. Make sure that both sides of the stepladder are locked into place. Ask someone to hold it for you, if possible. Clearly mark and make sure that you can see: Any grab bars or handrails. First and last steps. Where the edge of each step is. Use tools that help you move around (mobility aids) if they are needed. These include: Canes. Walkers. Scooters. Crutches. Turn on the lights when you go into a dark area. Replace any light bulbs as soon as they burn out. Set up your furniture so you have a clear path. Avoid moving your furniture around. If any of your floors are uneven, fix them. If there are any pets around you, be aware of where they are. Review your medicines with your doctor. Some medicines can make you feel dizzy. This can increase your chance of falling. Ask your doctor what other things that you can do to help prevent falls. This information is not intended to replace advice given to you by your health care provider. Make sure you discuss any questions you have with your health care provider. Document Released: 10/12/2009 Document Revised: 05/23/2016 Document Reviewed: 01/20/2015 Elsevier Interactive Patient Education  2017 [employer].

## 2023-01-28 NOTE — Progress Notes (Signed)
Virtual Visit via Telephone Note  I connected with  [name] on 01/28/23 at  2:00 PM EST by telephone and verified that I am speaking with the correct person using two identifiers.  Location: Patient: home   Provider: [facility] Persons participating in the virtual visit: patient/Nurse Health Advisor   I discussed the limitations, risks, security and privacy concerns of performing an evaluation and management service by telephone and the availability of in person appointments. The patient expressed understanding and agreed to proceed.  Interactive audio and video telecommunications were attempted between this nurse and patient, however failed, due to patient having technical difficulties OR patient did not have access to video capability.  We continued and completed visit with audio only.  Some vital signs may be absent or patient reported.   Roger Shelter, LPN  Subjective:   MIDA CORY is a 82 y.o. female who presents for Medicare Annual (Subsequent) preventive examination.  Review of Systems    Cardiac Risk Factors include: advanced age (>23mn, >>56women);diabetes mellitus;hypertension   Objective:    Today's Vitals   01/28/23 1403 01/28/23 1404  Weight: 143 lb (64.9 kg)   Height: '5\' 5"'$  (1.651 m)   PainSc:  4    Body mass index is 23.8 kg/m.     01/28/2023    2:27 PM 01/16/2022    2:55 PM 11/05/2021    8:45 AM 10/03/2020   12:55 PM 12/04/2017    2:16 PM 11/01/2016    2:37 PM 11/01/2015    2:23 PM  Advanced Directives  Does Patient Have a Medical Advance Directive? Yes No Yes Yes Yes Yes Yes  Type of Advance Directive Living will;Healthcare Power of [place]  Does patient want to make changes to medical advance directive?    No - Patient declined Yes (MAU/Ambulatory/Procedural Areas - Information given) No - Patient declined No - Patient declined  Copy of [place]? No - copy requested     Yes Yes  Would patient like information on creating a medical advance directive?  No - Patient declined         Current Medications (verified) Outpatient Encounter Medications as of 01/28/2023  Medication Sig   albuterol (VENTOLIN HFA) 108 (90 Base) MCG/ACT inhaler Inhale 2 puffs into the lungs every 6 (six) hours as needed for wheezing or shortness of breath.   atorvastatin (LIPITOR) 10 MG tablet Take 1 tablet (10 mg total) by mouth daily.   augmented betamethasone dipropionate (DIPROLENE-AF) 0.05 % cream Apply topically daily.   cycloSPORINE (RESTASIS) 0.05 % ophthalmic emulsion INSTILL ONE DROP IN EACH EYE TWICE DAILY   desonide (DESOWEN) 0.05 % ointment APPLY TOPICALLY TO THE AFFECTED AREA TWICE DAILY FOR 14 DAYS.   famotidine (PEPCID) 40 MG tablet TAKE ONE TABLET BY MOUTH DAILY   Finerenone (KERENDIA) 20 MG TABS Take 1 tablet by mouth daily.   levothyroxine (SYNTHROID) 75 MCG tablet Take 1 tablet (75 mcg total) by mouth daily.   losartan (COZAAR) 100 MG tablet Take 1 tablet (100 mg total) by mouth daily.   montelukast (SINGULAIR) 10 MG tablet TAKE ONE TABLET BY MOUTH AT BEDTIME   PULMICORT FLEXHALER 180 MCG/ACT inhaler Inhale 2 puffs into the lungs 2 (two) times daily.   No facility-administered encounter medications on file as of 01/28/2023.    Allergies (verified) Latex, Povidone-iodine, Simvastatin, Sulfonamide derivatives, Azithromycin, and Doxycycline  History: Past Medical History:  Diagnosis Date   Asthma    Diabetes mellitus without complication (HCC)    GERD (gastroesophageal reflux disease)    History of vertebral fracture 2013   Hyperlipidemia    Hypertension    Pneumothorax, traumatic 1993   rib fracture x4 (thrown from horse)   Urticaria    ? solar induced   Past Surgical History:  Procedure Laterality Date   COLONOSCOPY     Neg X3, benign polyps ;due 2019, Dr Sharlett Iles   DILATION AND CURETTAGE OF UTERUS   02/2007    Dr Radene Knee   TONSILLECTOMY     Family History  Problem Relation Age of Onset   Asthma Father    Diabetes Mother    Stroke Mother 67       post carotid endarterectomy   Hypertension Mother    Atrial fibrillation Brother    Kidney disease Brother        idiopathic   Stroke Maternal Grandfather        in 12s   Heart attack Maternal Uncle        after 30   Breast cancer Maternal Aunt    Social History   Socioeconomic History   Marital status: Divorced    Spouse name: Not on file   Number of children: Not on file   Years of education: Not on file   Highest education level: Not on file  Occupational History   Occupation: Retired Marine scientist  Tobacco Use   Smoking status: Former    Types: Cigarettes    Quit date: 12/31/1967    Years since quitting: 55.1   Smokeless tobacco: Former   Tobacco comments:    smoked 1965-1969, up 1/2 ppd  Vaping Use   Vaping Use: Never used  Substance and Sexual Activity   Alcohol use: No   Drug use: No   Sexual activity: Not Currently  Other Topics Concern   Not on file  Social History Narrative   Please schedule all AWV via phone per patient's request.   Social Determinants of Health   Financial Resource Strain: Low Risk  (01/28/2023)   Overall Financial Resource Strain (CARDIA)    Difficulty of Paying Living Expenses: Not hard at all  Food Insecurity: No Food Insecurity (01/28/2023)   Hunger Vital Sign    Worried About Running Out of Food in the Last Year: Never true    [place] in the Last Year: Never true  Transportation Needs: No Transportation Needs (01/28/2023)   PRAPARE - Hydrologist (Medical): No    Lack of Transportation (Non-Medical): No  Physical Activity: Sufficiently Active (01/28/2023)   Exercise Vital Sign    Days of Exercise per Week: 6 days    Minutes of Exercise per Session: 80 min  Stress: No Stress Concern Present (01/28/2023)   [place]    Feeling of Stress : Not at all  Social Connections: Moderately Integrated (01/28/2023)   Social Connection and Isolation Panel [NHANES]    Frequency of Communication with Friends and Family: More than three times a week    Frequency of Social Gatherings with Friends and Family: More than three times a week    Attends Religious Services: 1 to 4 times per year    Active Member of [employer] or Organizations: Yes    Attends Archivist Meetings: 1 to 4 times per year    Marital  Status: Divorced    Tobacco Counseling Counseling given: Not Answered Tobacco comments: smoked 1965-1969, up 1/2 ppd   Clinical Intake:  Pre-visit preparation completed: Yes  Pain : 0-10 Pain Score: 4  Pain Type: Chronic pain Pain Location: Neck (hip pain ;recent back pain) Pain Descriptors / Indicators: Aching Pain Onset: 1 to 4 weeks ago Pain Frequency: Intermittent Pain Relieving Factors: tylenol occassionally  Pain Relieving Factors: tylenol occassionally  BMI - recorded: 23.8 Nutritional Status: BMI of 19-24  Normal Nutritional Risks: None Diabetes: Yes CBG done?: No Did pt. bring in CBG monitor from home?: No (televisit)  How often do you need to have someone help you when you read instructions, pamphlets, or other written materials from your doctor or pharmacy?: 1 - Never  Diabetic?YES Interpreter Needed?: No Information entered by :: B.Hermelinda Diegel,LPN Activities of Daily Living    01/28/2023    2:28 PM  In your present state of health, do you have any difficulty performing the following activities:  Hearing? 0  Vision? 0  Difficulty concentrating or making decisions? 0  Walking or climbing stairs? 0  Dressing or bathing? 0  Doing errands, shopping? 0  Preparing Food and eating ? N  Using the Toilet? N  In the past six months, have you accidently leaked urine? N  Do you have problems with loss of bowel control? N  Managing your Medications? N   Managing your Finances? N  Housekeeping or managing your Housekeeping? N    Patient Care Team: Janith Lima, MD as PCP - General (Internal Medicine) Darleen Crocker, MD as Consulting Physician (Ophthalmology) Renda Rolls, Jennefer Bravo, MD as Referring Physician (Dermatology) Arvella Nigh, MD as Consulting Physician (Obstetrics and Gynecology) Imaging, [facility] (Diagnostic Radiology)  Indicate any recent Medical Services you may have received from other than [facility] providers in the past year (date may be approximate).     Assessment:   This is a routine wellness examination for [facility].  Hearing/Vision screen Hearing Screening - Comments:: Hearing adequate Vision Screening - Comments:: Adequate vision w/cataract lenses Eye MD:Dr.Bevis  Dietary issues and exercise activities discussed: Current Exercise Habits: Home exercise routine, Type of exercise: treadmill (sit petalling), Time (Minutes): > 60, Frequency (Times/Week): 5, Weekly Exercise (Minutes/Week): 0, Intensity: Mild, Exercise limited by: None identified   Goals Addressed               This Visit's Progress     Patient Stated (pt-stated)   On track     Yes; to get rid of those 2 pounds and continue working with nutritionist.        Depression Screen    01/28/2023    2:20 PM 01/16/2022    2:56 PM 11/05/2021    8:45 AM 10/03/2020   12:51 PM 03/13/2020    2:50 PM 12/08/2018    2:01 PM 12/07/2018   10:00 AM  PHQ 2/9 Scores  PHQ - 2 Score 1 0 0 0 0 0 0  PHQ- 9 Score       0    Fall Risk    01/28/2023    2:15 PM 01/16/2022    2:56 PM 11/05/2021    8:45 AM 10/03/2020   12:49 PM 03/13/2020    2:49 PM  [place] in the past year? 0 0 0 0 0  Number falls in past yr: 0 0  0 0  Injury with Fall? 0 0  0 0  Risk for fall due to :  No Fall Risks No Fall Risks  No Fall Risks   Follow up Education provided;Falls prevention discussed Falls evaluation completed  Falls evaluation completed Falls  evaluation completed    FALL RISK PREVENTION PERTAINING TO THE HOME:  Any stairs in or around the home? No  If so, are there any without handrails? No  Home free of loose throw rugs in walkways, pet beds, electrical cords, etc? Yes  Adequate lighting in your home to reduce risk of falls? Yes   ASSISTIVE DEVICES UTILIZED TO PREVENT FALLS:  Life alert? No  Use of a cane, walker or w/c? No  Grab bars in the bathroom? No  Shower chair or bench in shower? No  Elevated toilet seat or a handicapped toilet? No    Cognitive Function:    6 CIT Score   What year is it? Correct  What month is it? Correct  Give patient an address phrase to remember (5 components) [address]  About what time is it? Correct  Count backwards from 20 to 1 Correct  Say the months of the year in reverse Correct  Repeat the address phrase from earlier Correct  6 CIT Score 0 points   Immunizations Immunization History  Administered Date(s) Administered   Fluad Quad(high Dose 65+) 09/13/2020, 09/13/2021   H1N1 01/12/2009   Influenza Split 10/15/2011, 09/29/2012, 10/07/2014, 09/30/2015   Influenza Whole 10/24/2009, 09/21/2010   Influenza, High Dose Seasonal PF 09/15/2017, 09/18/2018, 12/14/2019, 12/14/2019, 09/26/2022   Influenza,inj,Quad PF,6+ Mos 10/21/2013   Influenza-Unspecified 09/29/2016   Moderna SARS-COV2 Booster Vaccination 10/06/2020   Moderna Sars-Covid-2 Vaccination 01/31/2020, 02/27/2020   Pneumococcal Conjugate-13 10/30/2016   Pneumococcal Polysaccharide-23 12/30/2000, 09/28/2008, 03/13/2017, 12/16/2022   Td 05/12/1994, 12/30/2002   Tdap 08/10/2012   Zoster Recombinat (Shingrix) 12/14/2019, 04/05/2020   Zoster, Live 06/24/2008    TDAP status: Due, Education has been provided regarding the importance of this vaccine. Advised may receive this vaccine at local pharmacy or Health Dept. Aware to provide a copy of the vaccination record if obtained from local pharmacy or Health Dept.  Verbalized acceptance and understanding.  Flu Vaccine status: Up to date  Pneumococcal vaccine status: Up to date  Covid-19 vaccine status: Completed vaccines  Qualifies for Shingles Vaccine? Yes   Zostavax completed Yes   Shingrix Completed?: Yes  Screening Tests Health Maintenance  Topic Date Due   Diabetic kidney evaluation - Urine ACR  Never done   DTaP/Tdap/Td (4 - Td or Tdap) 08/10/2022   COVID-19 Vaccine (4 - 2023-24 season) 08/30/2022   FOOT EXAM  09/13/2022   OPHTHALMOLOGY EXAM  12/20/2022   HEMOGLOBIN A1C  06/17/2023   Diabetic kidney evaluation - eGFR measurement  12/17/2023   Medicare Annual Wellness (AWV)  01/29/2024   Pneumonia Vaccine 45+ Years old  Completed   INFLUENZA VACCINE  Completed   DEXA SCAN  Completed   Hepatitis C Screening  Completed   Zoster Vaccines- Shingrix  Completed   HPV VACCINES  Aged Out    Health Maintenance  Health Maintenance Due  Topic Date Due   Diabetic kidney evaluation - Urine ACR  Never done   DTaP/Tdap/Td (4 - Td or Tdap) 08/10/2022   COVID-19 Vaccine (4 - 2023-24 season) 08/30/2022   FOOT EXAM  09/13/2022   OPHTHALMOLOGY EXAM  12/20/2022    Colorectal cancer screening: No longer required.   Mammogram status: No longer required due to age.  Bone Density status: Completed yes. Results reflect: Bone density results: NORMAL. Repeat every 5  years.  Lung Cancer Screening: (Low Dose CT Chest recommended if Age 85-80 years, 30 pack-year currently smoking OR have quit w/in 15years.) does not qualify.   Lung Cancer Screening Referral: no  Additional Screening:  Hepatitis C Screening: does not qualify; Completed no  Vision Screening: Recommended annual ophthalmology exams for early detection of glaucoma and other disorders of the eye. Is the patient up to date with their annual eye exam?  Yes  Who is the provider or what is the name of the office in which the patient attends annual eye exams? Dr Talbert Forest If pt is not  established with a provider, would they like to be referred to a provider to establish care? No .   Dental Screening: Recommended annual dental exams for proper oral hygiene  Community Resource Referral / Chronic Care Management: CRR required this visit?  No   CCM required this visit?  No   Plan:    I have personally reviewed and noted the following in the patient's chart:   Medical and social history Use of alcohol, tobacco or illicit drugs  Current medications and supplements including opioid prescriptions. Patient is not currently taking opioid prescriptions. Functional ability and status Nutritional status Physical activity Advanced directives List of other physicians Hospitalizations, surgeries, and ER visits in previous 12 months Vitals Screenings to include cognitive, depression, and falls Referrals and appointments  In addition, I have reviewed and discussed with patient certain preventive protocols, quality metrics, and best practice recommendations. A written personalized care plan for preventive services as well as general preventive health recommendations were provided to patient.   Roger Shelter, LPN   1/61/0960   Nurse Notes: none

## 2023-02-28 ENCOUNTER — Other Ambulatory Visit: Payer: Self-pay | Admitting: Internal Medicine

## 2023-02-28 DIAGNOSIS — J4520 Mild intermittent asthma, uncomplicated: Secondary | ICD-10-CM

## 2023-02-28 DIAGNOSIS — I10 Essential (primary) hypertension: Secondary | ICD-10-CM

## 2023-02-28 DIAGNOSIS — E785 Hyperlipidemia, unspecified: Secondary | ICD-10-CM

## 2023-03-18 ENCOUNTER — Telehealth: Payer: Self-pay | Admitting: Internal Medicine

## 2023-03-18 DIAGNOSIS — N1832 Chronic kidney disease, stage 3b: Secondary | ICD-10-CM

## 2023-03-18 MED ORDER — KERENDIA 20 MG PO TABS: 1.0000 | ORAL_TABLET | Freq: Every day | ORAL | 1 refills | Status: DC

## 2023-03-18 NOTE — Telephone Encounter (Signed)
Sent refill to [facility]../l;mb

## 2023-03-18 NOTE — Telephone Encounter (Signed)
[facility] called for Rx  Prescription Request  03/18/2023  LOV: 12/16/2022  What is the name of the medication or equipment?  Finerenone (KERENDIA) 20 MG TABS   Have you contacted your pharmacy to request a refill? No   Which pharmacy would you like this sent to?  [place], [place] [address] Phone: [phone] Fax: [phone]    Patient notified that their request is being sent to the clinical staff for review and that they should receive a response within 2 business days.   Please advise at Mobile [phone] (mobile)

## 2023-04-01 DIAGNOSIS — Z961 Presence of intraocular lens: Secondary | ICD-10-CM | POA: Diagnosis not present

## 2023-04-01 DIAGNOSIS — H26493 Other secondary cataract, bilateral: Secondary | ICD-10-CM | POA: Diagnosis not present

## 2023-04-01 DIAGNOSIS — H43813 Vitreous degeneration, bilateral: Secondary | ICD-10-CM | POA: Diagnosis not present

## 2023-04-01 DIAGNOSIS — H16223 Keratoconjunctivitis sicca, not specified as Sjogren's, bilateral: Secondary | ICD-10-CM | POA: Diagnosis not present

## 2023-04-10 ENCOUNTER — Encounter: Payer: Medicare PPO | Attending: Internal Medicine | Admitting: Dietician

## 2023-04-10 ENCOUNTER — Encounter: Payer: Self-pay | Admitting: Dietician

## 2023-04-10 DIAGNOSIS — E1122 Type 2 diabetes mellitus with diabetic chronic kidney disease: Secondary | ICD-10-CM | POA: Diagnosis not present

## 2023-04-10 DIAGNOSIS — N189 Chronic kidney disease, unspecified: Secondary | ICD-10-CM | POA: Insufficient documentation

## 2023-04-10 DIAGNOSIS — N1832 Chronic kidney disease, stage 3b: Secondary | ICD-10-CM | POA: Insufficient documentation

## 2023-04-10 NOTE — Progress Notes (Signed)
Diabetes Self-Management Education  Visit Type: Follow-up  Appt. Start Time: 1415 Appt. End Time: 1445  04/15/2023  [name], identified by name and date of birth, is a 82 y.o. female with a diagnosis of Diabetes:  .   ASSESSMENT Patient is here today alone.   She was last seen by this RD 06/27/2022 Her diabetes is well controlled and she is here to discuss her diet to stay on track and ask questions. Dislikes cooking.  Eats simply and TV dinners at times.  Used to eat all meals out prior to diagnosis. Loves sweets but avoids.  Dislikes water but is drinking with diet juice or diet gingerale.   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  She is now at her goal weight.  She gained weight after retirement. She is now maintaining her weight at 145 lbs 146 lbs 04/10/2023   Social History:  Patient lives alone.  Enjoys walking her dog and spends about 3 hours each day outside daily with the dog, walking and playing.  She also uses a peddler.  She does not cook and eats simple foods.  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  She used to have horses.   Diabetes Self-Management Education - 04/15/23 1607       Visit Information   Visit Type Follow-up      Psychosocial Assessment   Patient Belief/Attitude about Diabetes Motivated to manage diabetes    What is the hardest part about your diabetes right now, causing you the most concern, or is the most worrisome to you about your diabetes?   Making healty food and beverage choices    Self-management support Doctor's office;CDE visits    Other persons present Patient    Patient Concerns Nutrition/Meal  planning    Special Needs None    Preferred Learning Style No preference indicated    Learning Readiness Ready      Pre-Education Assessment   Patient understands the diabetes disease and treatment process. Demonstrates understanding / competency    Patient understands incorporating nutritional management into lifestyle. Comprehends key points    Patient undertands incorporating physical activity into lifestyle. Demonstrates understanding / competency    Patient understands using medications safely. Demonstrates understanding / competency    Patient understands monitoring blood glucose, interpreting and using results Demonstrates understanding / competency    Patient understands prevention, detection, and treatment of acute complications. Demonstrates understanding / competency    Patient understands prevention, detection, and treatment of chronic complications. Demonstrates understanding / competency    Patient understands how to develop strategies to address psychosocial issues. Demonstrates understanding / competency    Patient understands how to develop strategies to promote health/change behavior. Comprehends key points      Complications   Last HgB A1C per patient/outside source 6.1 %   12/17/2022   How often do you check your blood sugar? 0 times/day (not testing)      Dietary Intake   Breakfast cereal (rice krispies), blueberries, skim free milk and if hungry a [place] slider    Lunch diet frozen meal that is low sodium OR [place] sandwich,  fresh fruit (mandarin orange with low fat mayo and cheese)    Dinner slaw or salad, navy or lima beans, OR frozen meal if n    Beverage(s) diet cranberry juice, water, diet gingerale      Activity / Exercise   Activity / Exercise Type Light (walking / raking leaves)    How many days per week do you exercise? 7    How many minutes per day do you exercise? 30    Total minutes per week of exercise 210      Patient Education   Previous Diabetes  Education Yes (please comment)   05/2022   Healthy Eating Plate Method;Meal options for control of blood glucose level and chronic complications.    Diabetes Stress and Support Identified and addressed patients feelings and concerns about diabetes      Individualized Goals (developed by patient)   Nutrition General guidelines for healthy choices and portions discussed    Physical Activity Exercise 5-7 days per week;30 minutes per day    Medications take my medication as prescribed      Patient Self-Evaluation of Goals - Patient rates self as meeting previously set goals (% of time)   Nutrition >75% (most of the time)    Physical Activity >75% (most of the time)    Medications >75% (most of the time)    Monitoring >75% (most of the time)    Problem Solving and behavior change strategies  >75% (most of the time)    Reducing Risk (treating acute and chronic complications) >75% (most of the time)    Health Coping >75% (most of the time)      Post-Education Assessment   Patient understands the diabetes disease and treatment process. Demonstrates understanding / competency    Patient understands incorporating nutritional management into lifestyle. Demonstrates understanding / competency    Patient undertands incorporating physical activity into lifestyle. Demonstrates understanding / competency    Patient understands using medications safely. Demonstrates understanding / competency    Patient understands monitoring blood glucose, interpreting and using results Demonstrates understanding / competency    Patient understands prevention, detection, and treatment of acute complications. Demonstrates understanding / competency    Patient understands prevention, detection, and treatment of chronic complications. Demonstrates understanding / competency    Patient understands how to develop strategies to address psychosocial issues. Demonstrates understanding / competency    Patient understands how to  develop strategies to promote health/change behavior. Demonstrates understanding / competency      Outcomes   Expected Outcomes Demonstrated interest in learning. Expect positive outcomes    Future DMSE PRN    Program Status Completed      Subsequent Visit   Since your last visit have you experienced any weight changes? Gain    Weight Gain (lbs) 1             Individualized Plan for Diabetes Self-Management Training:   Learning Objective:  Patient will have a greater understanding of diabetes self-management. Patient education plan is to attend individual and/or group sessions per assessed needs and concerns.   Plan:   Patient Instructions  Meal Delivery options:  (frozen, preprepared, or kits) [employer]  (advertises to see if you qualify for insurance coverage of the meals) [employer]  [phone] (25% off with cod RD25 and pop up for 50% off) Whole [email or web address]://[email or web address].[email or web address] (Whole foods plant based) Hungry Root Purple Carrot (vegan) Other companies can be found on line Leaf[employer] (vegan) - does not require refrigeration so great  for travel and backpacking  Easy meal ideas:  Roasted vegetables, rotisserie chicken  Salad and low sodium soup, cheese toast  Rotisserie chicken soft taco's  See recipes provided  Adapt recipes to be low sodium     Expected Outcomes:  Demonstrated interest in learning. Expect positive outcomes  Education material provided:   If problems or questions, patient to contact team via:  Phone  Future DSME appointment: PRN

## 2023-04-10 NOTE — Patient Instructions (Addendum)
Meal Delivery options:  (frozen, preprepared, or kits) [employer]  (advertises to see if you qualify for insurance coverage of the meals) [employer]  ([phone] (25% off with cod RD25 and pop up for 50% off) Whole https://[email or web address].[email or web address] (Whole foods plant based) Hungry Root Purple Carrot (vegan) Other companies can be found on line [employer] (vegan) - does not require refrigeration so great for travel and backpacking  Easy meal ideas:  Roasted vegetables, rotisserie chicken  Salad and low sodium soup, cheese toast  Rotisserie chicken soft taco's  See recipes provided  Adapt recipes to be low sodium

## 2023-04-17 ENCOUNTER — Telehealth: Payer: Self-pay | Admitting: Internal Medicine

## 2023-04-17 DIAGNOSIS — E1122 Type 2 diabetes mellitus with diabetic chronic kidney disease: Secondary | ICD-10-CM

## 2023-04-17 DIAGNOSIS — N1832 Chronic kidney disease, stage 3b: Secondary | ICD-10-CM

## 2023-04-17 MED ORDER — KERENDIA 20 MG PO TABS: 1.0000 | ORAL_TABLET | Freq: Every day | ORAL | 0 refills | Status: DC

## 2023-04-17 NOTE — Telephone Encounter (Signed)
Prescription Request  04/17/2023  LOV: 12/16/2022  What is the name of the medication or equipment? Finerenone (KERENDIA) 20 MG TABS   Have you contacted your pharmacy to request a refill? Yes   Which pharmacy would you like this sent to?  [facility] [place], [place] - [address] [place] [address] Phone: [phone] Fax: [phone]    Patient notified that their request is being sent to the clinical staff for review and that they should receive a response within 2 business days.   Please advise at Mobile [phone] (mobile)   Patient would like medication to be changed to 90 days worth.

## 2023-04-30 DIAGNOSIS — D225 Melanocytic nevi of trunk: Secondary | ICD-10-CM | POA: Diagnosis not present

## 2023-04-30 DIAGNOSIS — L578 Other skin changes due to chronic exposure to nonionizing radiation: Secondary | ICD-10-CM | POA: Diagnosis not present

## 2023-04-30 DIAGNOSIS — L814 Other melanin hyperpigmentation: Secondary | ICD-10-CM | POA: Diagnosis not present

## 2023-04-30 DIAGNOSIS — L821 Other seborrheic keratosis: Secondary | ICD-10-CM | POA: Diagnosis not present

## 2023-04-30 DIAGNOSIS — R202 Paresthesia of skin: Secondary | ICD-10-CM | POA: Diagnosis not present

## 2023-05-12 ENCOUNTER — Other Ambulatory Visit: Payer: Self-pay | Admitting: Internal Medicine

## 2023-05-12 ENCOUNTER — Telehealth: Payer: Self-pay | Admitting: Internal Medicine

## 2023-05-12 DIAGNOSIS — L719 Rosacea, unspecified: Secondary | ICD-10-CM

## 2023-05-12 DIAGNOSIS — N1832 Chronic kidney disease, stage 3b: Secondary | ICD-10-CM

## 2023-05-12 MED ORDER — KERENDIA 20 MG PO TABS: 1.0000 | ORAL_TABLET | Freq: Every day | ORAL | 0 refills | Status: DC

## 2023-05-12 NOTE — Telephone Encounter (Signed)
Prescription Request  05/12/2023  LOV: 12/16/2022  What is the name of the medication or equipment? Finerenone (KERENDIA) 20 MG TABS   Have you contacted your pharmacy to request a refill? Yes   Which pharmacy would you like this sent to?  [facility] [address], [place], [place] [address]    Patient notified that their request is being sent to the clinical staff for review and that they should receive a response within 2 business days.   Please advise at Mobile [phone] (mobile)    Patient has switched pharmacies. The pharmacy said they sent a request originally on 05/05/23. Patient's next OV is 06/19/2023.

## 2023-06-02 ENCOUNTER — Other Ambulatory Visit: Payer: Self-pay | Admitting: Internal Medicine

## 2023-06-02 DIAGNOSIS — E785 Hyperlipidemia, unspecified: Secondary | ICD-10-CM

## 2023-06-02 DIAGNOSIS — Z124 Encounter for screening for malignant neoplasm of cervix: Secondary | ICD-10-CM | POA: Diagnosis not present

## 2023-06-02 DIAGNOSIS — J4520 Mild intermittent asthma, uncomplicated: Secondary | ICD-10-CM

## 2023-06-02 DIAGNOSIS — Z1272 Encounter for screening for malignant neoplasm of vagina: Secondary | ICD-10-CM | POA: Diagnosis not present

## 2023-06-02 DIAGNOSIS — Z6824 Body mass index (BMI) 24.0-24.9, adult: Secondary | ICD-10-CM | POA: Diagnosis not present

## 2023-06-02 DIAGNOSIS — I10 Essential (primary) hypertension: Secondary | ICD-10-CM

## 2023-06-19 ENCOUNTER — Encounter: Payer: Self-pay | Admitting: Internal Medicine

## 2023-06-19 ENCOUNTER — Ambulatory Visit: Payer: Medicare PPO | Admitting: Internal Medicine

## 2023-06-19 VITALS — BP 122/68 | HR 83 | Temp 98.0°F | Ht 65.0 in | Wt 145.0 lb

## 2023-06-19 DIAGNOSIS — E1122 Type 2 diabetes mellitus with diabetic chronic kidney disease: Secondary | ICD-10-CM | POA: Diagnosis not present

## 2023-06-19 DIAGNOSIS — E039 Hypothyroidism, unspecified: Secondary | ICD-10-CM | POA: Diagnosis not present

## 2023-06-19 DIAGNOSIS — K219 Gastro-esophageal reflux disease without esophagitis: Secondary | ICD-10-CM | POA: Diagnosis not present

## 2023-06-19 DIAGNOSIS — Z0001 Encounter for general adult medical examination with abnormal findings: Secondary | ICD-10-CM

## 2023-06-19 DIAGNOSIS — E781 Pure hyperglyceridemia: Secondary | ICD-10-CM

## 2023-06-19 DIAGNOSIS — Z23 Encounter for immunization: Secondary | ICD-10-CM

## 2023-06-19 DIAGNOSIS — N1832 Chronic kidney disease, stage 3b: Secondary | ICD-10-CM | POA: Diagnosis not present

## 2023-06-19 DIAGNOSIS — E785 Hyperlipidemia, unspecified: Secondary | ICD-10-CM | POA: Diagnosis not present

## 2023-06-19 DIAGNOSIS — J4520 Mild intermittent asthma, uncomplicated: Secondary | ICD-10-CM | POA: Diagnosis not present

## 2023-06-19 DIAGNOSIS — I10 Essential (primary) hypertension: Secondary | ICD-10-CM

## 2023-06-19 DIAGNOSIS — Z0000 Encounter for general adult medical examination without abnormal findings: Secondary | ICD-10-CM

## 2023-06-19 LAB — MICROALBUMIN / CREATININE URINE RATIO
Creatinine,U: 148.9 mg/dL
Microalb Creat Ratio: 1 mg/g (ref 0.0–30.0)
Microalb, Ur: 1.4 mg/dL (ref 0.0–1.9)

## 2023-06-19 LAB — BASIC METABOLIC PANEL
BUN: 30 mg/dL — ABNORMAL HIGH (ref 6–23)
CO2: 27 mEq/L (ref 19–32)
Calcium: 9.8 mg/dL (ref 8.4–10.5)
Chloride: 104 mEq/L (ref 96–112)
Creatinine, Ser: 1.06 mg/dL (ref 0.40–1.20)
GFR: 49.93 mL/min — ABNORMAL LOW (ref >60.00)
Glucose, Bld: 114 mg/dL — ABNORMAL HIGH (ref 70–99)
Potassium: 4.4 mEq/L (ref 3.5–5.1)
Sodium: 140 mEq/L (ref 135–145)

## 2023-06-19 LAB — CBC WITH DIFFERENTIAL/PLATELET
Basophils Absolute: 0.1 10*3/uL (ref 0.0–0.1)
Basophils Relative: 1.1 % (ref 0.0–3.0)
Eosinophils Absolute: 0.3 10*3/uL (ref 0.0–0.7)
Eosinophils Relative: 4.7 % (ref 0.0–5.0)
HCT: 44.3 % (ref 36.0–46.0)
Hemoglobin: 14.7 g/dL (ref 12.0–15.0)
Lymphocytes Relative: 12.7 % (ref 12.0–46.0)
Lymphs Abs: 0.8 10*3/uL (ref 0.7–4.0)
MCHC: 33.1 g/dL (ref 30.0–36.0)
MCV: 89.2 fl (ref 78.0–100.0)
Monocytes Absolute: 0.4 10*3/uL (ref 0.1–1.0)
Monocytes Relative: 6.6 % (ref 3.0–12.0)
Neutro Abs: 4.9 10*3/uL (ref 1.4–7.7)
Neutrophils Relative %: 74.9 % (ref 43.0–77.0)
Platelets: 364 10*3/uL (ref 150.0–400.0)
RBC: 4.97 Mil/uL (ref 3.87–5.11)
RDW: 13.5 % (ref 11.5–15.5)
WBC: 6.6 10*3/uL (ref 4.0–10.5)

## 2023-06-19 LAB — HEPATIC FUNCTION PANEL
ALT: 21 U/L (ref 0–35)
AST: 24 U/L (ref 0–37)
Albumin: 4.4 g/dL (ref 3.5–5.2)
Alkaline Phosphatase: 98 U/L (ref 39–117)
Bilirubin, Direct: 0.1 mg/dL (ref 0.0–0.3)
Total Bilirubin: 0.5 mg/dL (ref 0.2–1.2)
Total Protein: 7.5 g/dL (ref 6.0–8.3)

## 2023-06-19 LAB — URINALYSIS, ROUTINE W REFLEX MICROSCOPIC
Bilirubin Urine: NEGATIVE
Hgb urine dipstick: NEGATIVE
Ketones, ur: NEGATIVE
Leukocytes,Ua: NEGATIVE
Nitrite: NEGATIVE
RBC / HPF: NONE SEEN (ref 0–?)
Specific Gravity, Urine: 1.025 (ref 1.000–1.030)
Total Protein, Urine: NEGATIVE
Urine Glucose: NEGATIVE
Urobilinogen, UA: 0.2 (ref 0.0–1.0)
pH: 6 (ref 5.0–8.0)

## 2023-06-19 LAB — LDL CHOLESTEROL, DIRECT: Direct LDL: 88 mg/dL

## 2023-06-19 LAB — LIPID PANEL
Cholesterol: 158 mg/dL (ref 0–200)
HDL: 47.6 mg/dL (ref >39.00)
NonHDL: 110.79
Total CHOL/HDL Ratio: 3
Triglycerides: 211 mg/dL — ABNORMAL HIGH (ref 0.0–149.0)
VLDL: 42.2 mg/dL — ABNORMAL HIGH (ref 0.0–40.0)

## 2023-06-19 LAB — HEMOGLOBIN A1C: Hgb A1c MFr Bld: 6 % (ref 4.6–6.5)

## 2023-06-19 LAB — TSH: TSH: 2.84 u[IU]/mL (ref 0.35–5.50)

## 2023-06-19 MED ORDER — AIRSUPRA 90-80 MCG/ACT IN AERO
2.0000 | INHALATION_SPRAY | Freq: Four times a day (QID) | RESPIRATORY_TRACT | 1 refills | Status: DC | PRN
Start: 2023-06-19 — End: 2024-06-02

## 2023-06-19 MED ORDER — LEVOTHYROXINE SODIUM 75 MCG PO TABS: 75.0000 ug | ORAL_TABLET | Freq: Every day | ORAL | 1 refills | Status: DC

## 2023-06-19 MED ORDER — PULMICORT FLEXHALER 180 MCG/ACT IN AEPB
2.0000 | INHALATION_SPRAY | Freq: Two times a day (BID) | RESPIRATORY_TRACT | 1 refills | Status: DC
Start: 2023-06-19 — End: 2024-09-01

## 2023-06-19 NOTE — Progress Notes (Signed)
Subjective:  Patient ID: Natalie Shannon, female    DOB: 1941-12-19  Age: 82 y.o. MRN: [id number]  CC: Annual Exam, Diabetes, Hypertension, Hypothyroidism, Allergic Rhinitis , Hyperlipidemia, and Asthma   HPI Natalie Shannon presents for a CPX and f/up ---  Discussed the use of AI scribe software for clinical note transcription with the patient, who gave verbal consent to proceed.  History of Present Illness   The patient, with a history of allergies and asthma, presents with exacerbated allergy symptoms. They report sneezing and eye irritation, which are typical for them during allergy season. They had previously been on allergy shots, but these were discontinued a few years ago. They have been managing their symptoms with over-the-counter medication, the nature of which is unclear, but they believe it is not an antihistamine. They had stopped taking all medications for allergies.  The patient also uses an inhaler, the specific type is not mentioned, but it has been a long-term treatment. They deny any symptoms of thyroid dysfunction such as weight changes, constipation, or diarrhea. However, they report difficulty falling asleep and staying asleep.  Despite these issues, the patient maintains an active lifestyle, spending up to three hours a day pedaling a stationary bike. They deny experiencing any chest pain or shortness of breath during this activity. They also deny any leg or foot swelling. They have regular annual eye exams, the most recent of which was likely in December, with no reported issues. They deny any calf pain or swelling during walking, and also deny experiencing palpitations, dizziness, or lightheadedness.       History of Present Illness   The patient, with a history of allergies and asthma, presents with exacerbated allergy symptoms. They report sneezing and eye irritation, which are typical for them during allergy season. They had previously been on allergy shots, but  these were discontinued a few years ago. They have been managing their symptoms with over-the-counter medication, the nature of which is unclear, but they believe it is not an antihistamine. They had stopped taking all medications for allergies.  The patient also uses an inhaler, the specific type is not mentioned, but it has been a long-term treatment. They deny any symptoms of thyroid dysfunction such as weight changes, constipation, or diarrhea. However, they report difficulty falling asleep and staying asleep.  Despite these issues, the patient maintains an active lifestyle, spending up to three hours a day pedaling a stationary bike. They deny experiencing any chest pain or shortness of breath during this activity. They also deny any leg or foot swelling. They have regular annual eye exams, the most recent of which was likely in December, with no reported issues. They deny any calf pain or swelling during walking, and also deny experiencing palpitations, dizziness, or lightheadedness.       Outpatient Medications Prior to Visit  Medication Sig Dispense Refill   atorvastatin (LIPITOR) 10 MG tablet TAKE ONE TABLET BY MOUTH DAILY 90 tablet 0   augmented betamethasone dipropionate (DIPROLENE-AF) 0.05 % cream Apply topically daily.     cycloSPORINE (RESTASIS) 0.05 % ophthalmic emulsion INSTILL ONE DROP IN EACH EYE TWICE DAILY     desonide (DESOWEN) 0.05 % ointment APPLY TOPICALLY TO THE AFFECTED AREA TWICE DAILY FOR 14 DAYS. 15 g 2   famotidine (PEPCID) 40 MG tablet TAKE ONE TABLET BY MOUTH DAILY 90 tablet 1   Finerenone (KERENDIA) 20 MG TABS Take 1 tablet (20 mg total) by mouth daily. 90 tablet 0   losartan (  COZAAR) 100 MG tablet TAKE ONE TABLET BY MOUTH DAILY 90 tablet 0   metroNIDAZOLE (METROCREAM) 0.75 % cream Apply topically 2 (two) times daily. 45 g 2   montelukast (SINGULAIR) 10 MG tablet TAKE ONE TABLET BY MOUTH AT BEDTIME 90 tablet 0   albuterol (VENTOLIN HFA) 108 (90 Base) MCG/ACT inhaler  Inhale 2 puffs into the lungs every 6 (six) hours as needed for wheezing or shortness of breath. 1 Inhaler 3   levothyroxine (SYNTHROID) 75 MCG tablet Take 1 tablet (75 mcg total) by mouth daily. 90 tablet 1   PULMICORT FLEXHALER 180 MCG/ACT inhaler Inhale 2 puffs into the lungs 2 (two) times daily. 6 each 1   No facility-administered medications prior to visit.    ROS Review of Systems  HENT:  Positive for congestion, postnasal drip and rhinorrhea. Negative for sinus pain, sore throat and trouble swallowing.   Eyes: Negative.   Respiratory:  Negative for cough, choking, chest tightness, shortness of breath and wheezing.   Cardiovascular:  Negative for chest pain, palpitations and leg swelling.  Gastrointestinal:  Negative for abdominal pain, constipation, diarrhea, nausea and vomiting.  Endocrine: Negative.   Genitourinary: Negative.  Negative for difficulty urinating.  Musculoskeletal: Negative.  Negative for arthralgias and myalgias.  Skin: Negative.  Negative for color change and rash.  Neurological:  Negative for dizziness, weakness and headaches.  Hematological:  Negative for adenopathy. Does not bruise/bleed easily.  Psychiatric/Behavioral: Negative.      Objective:  BP 122/68 (BP Location: Right Arm, Patient Position: Sitting, Cuff Size: Large)   Pulse 83   Temp 98 F (36.7 C) (Oral)   Ht 5\' 5"  (1.651 m)   Wt 145 lb (65.8 kg)   SpO2 96%   BMI 24.13 kg/m   BP Readings from Last 3 Encounters:  06/19/23 122/68  12/16/22 (!) 140/78  06/13/22 134/74    Wt Readings from Last 3 Encounters:  06/19/23 145 lb (65.8 kg)  01/28/23 143 lb (64.9 kg)  12/16/22 143 lb 12.8 oz (65.2 kg)    Physical Exam Vitals reviewed.  Constitutional:      General: She is not in acute distress.    Appearance: She is not ill-appearing, toxic-appearing or diaphoretic.  HENT:     Nose: Nose normal.     Mouth/Throat:     Mouth: Mucous membranes are moist.  Eyes:     General: No scleral  icterus.    Conjunctiva/sclera: Conjunctivae normal.  Cardiovascular:     Rate and Rhythm: Normal rate and regular rhythm.     Heart sounds: No murmur heard.    No friction rub. No gallop.  Pulmonary:     Effort: Pulmonary effort is normal.     Breath sounds: No stridor. No wheezing, rhonchi or rales.  Abdominal:     General: Abdomen is flat.     Palpations: There is no mass.     Tenderness: There is no abdominal tenderness. There is no guarding.     Hernia: No hernia is present.  Musculoskeletal:        General: Normal range of motion.     Cervical back: Neck supple.     Right lower leg: No edema.     Left lower leg: No edema.  Lymphadenopathy:     Cervical: No cervical adenopathy.  Skin:    General: Skin is warm and dry.  Neurological:     General: No focal deficit present.     Mental Status: She is alert. Mental status  is at baseline.  Psychiatric:        Mood and Affect: Mood normal.     Lab Results  Component Value Date   WBC 6.6 06/19/2023   HGB 14.7 06/19/2023   HCT 44.3 06/19/2023   PLT 364.0 06/19/2023   GLUCOSE 114 (H) 06/19/2023   CHOL 158 06/19/2023   TRIG 211.0 (H) 06/19/2023   HDL 47.60 06/19/2023   LDLDIRECT 88.0 06/19/2023   LDLCALC 86 06/13/2022   ALT 21 06/19/2023   AST 24 06/19/2023   NA 140 06/19/2023   K 4.4 06/19/2023   CL 104 06/19/2023   CREATININE 1.06 06/19/2023   BUN 30 (H) 06/19/2023   CO2 27 06/19/2023   TSH 2.84 06/19/2023   INR 1.01 05/15/2012   HGBA1C 6.0 06/19/2023   MICROALBUR 1.4 06/19/2023    No results found.  Assessment & Plan:   Essential hypertension- Her blood pressure is adequately well-controlled. -     TSH; Future -     Urinalysis, Routine w reflex microscopic; Future -     CBC with Differential/Platelet; Future -     Basic metabolic panel; Future  Gastroesophageal reflux disease without esophagitis -     CBC with Differential/Platelet; Future  Hyperlipidemia LDL goal <130- LDL goal achieved. Doing well  on the statin  -     Lipid panel; Future -     TSH; Future -     Hepatic function panel; Future  Acquired hypothyroidism- She is euthyroid. -     TSH; Future -     Levothyroxine Sodium; Take 1 tablet (75 mcg total) by mouth daily.  Dispense: 90 tablet; Refill: 1  Pure hyperglyceridemia  Stage 3b chronic kidney disease- Will avoid nephrotoxic agents. -     Microalbumin / creatinine urine ratio; Future -     Urinalysis, Routine w reflex microscopic; Future -     Basic metabolic panel; Future  Type 2 diabetes mellitus with stage 3b chronic kidney disease, without long-term current use of insulin (HCC)- Her blood sugar is adequately well-controlled. -     Microalbumin / creatinine urine ratio; Future -     Urinalysis, Routine w reflex microscopic; Future -     Hemoglobin A1c; Future -     Basic metabolic panel; Future -     HM Diabetes Foot Exam  Asthma, mild intermittent, well-controlled -     Airsupra; Inhale 2 puffs into the lungs 4 (four) times daily as needed.  Dispense: 32.1 g; Refill: 1 -     Pulmicort Flexhaler; Inhale 2 puffs into the lungs 2 (two) times daily.  Dispense: 6 each; Refill: 1  Other orders -     LDL cholesterol, direct     Follow-up: Return in about 6 months (around 12/19/2023).  Sanda Linger, MD

## 2023-06-19 NOTE — Patient Instructions (Signed)

## 2023-06-20 MED ORDER — BOOSTRIX 5-2.5-18.5 LF-MCG/0.5 IM SUSP
0.5000 mL | Freq: Once | INTRAMUSCULAR | 0 refills | Status: AC
Start: 2023-06-20 — End: 2023-06-20

## 2023-06-20 NOTE — Assessment & Plan Note (Signed)
Exam completed °Labs reviewed °Vaccines reviewed and updated °No cancer screenings indicated °Patient education was given °

## 2023-06-25 ENCOUNTER — Telehealth: Payer: Self-pay

## 2023-06-25 ENCOUNTER — Other Ambulatory Visit (HOSPITAL_COMMUNITY): Payer: Self-pay

## 2023-06-25 NOTE — Telephone Encounter (Signed)
Patient Advocate Encounter   Received notification from [facility] that prior authorization is required for Airsupra 90-80MCG/ACT aerosol   Submitted: 06-25-2023 Key [id number]  Status is pending

## 2023-06-26 ENCOUNTER — Other Ambulatory Visit (HOSPITAL_COMMUNITY): Payer: Self-pay

## 2023-06-26 NOTE — Telephone Encounter (Signed)
Patient Advocate Encounter  Prior Authorization for Paulene Floor 90-50 has been approved with [employer].    PA# [id number] Effective dates: 06/25/23 through 12/29/23  Per [employer] test claim, copay for 30 days supply is $100  Placed a call to [facility], left a message to notify of the approval.  Approval letter indexed to chart.

## 2023-06-26 NOTE — Telephone Encounter (Signed)
Patient refuses to be switched to this medication.  She would like to stay with the pulmicort because it works.  Please call patient  [phone]

## 2023-06-27 NOTE — Telephone Encounter (Signed)
Pt has been informed and expressed understanding.  

## 2023-08-05 ENCOUNTER — Other Ambulatory Visit: Payer: Self-pay | Admitting: Internal Medicine

## 2023-08-05 DIAGNOSIS — N1832 Chronic kidney disease, stage 3b: Secondary | ICD-10-CM

## 2023-08-05 DIAGNOSIS — E1122 Type 2 diabetes mellitus with diabetic chronic kidney disease: Secondary | ICD-10-CM

## 2023-08-26 ENCOUNTER — Other Ambulatory Visit: Payer: Self-pay | Admitting: Internal Medicine

## 2023-08-26 DIAGNOSIS — K219 Gastro-esophageal reflux disease without esophagitis: Secondary | ICD-10-CM

## 2023-08-28 ENCOUNTER — Other Ambulatory Visit: Payer: Self-pay | Admitting: Internal Medicine

## 2023-08-28 DIAGNOSIS — J4520 Mild intermittent asthma, uncomplicated: Secondary | ICD-10-CM

## 2023-08-28 DIAGNOSIS — E785 Hyperlipidemia, unspecified: Secondary | ICD-10-CM

## 2023-08-28 DIAGNOSIS — I10 Essential (primary) hypertension: Secondary | ICD-10-CM

## 2023-10-28 ENCOUNTER — Other Ambulatory Visit: Payer: Self-pay | Admitting: Internal Medicine

## 2023-10-28 DIAGNOSIS — N1832 Chronic kidney disease, stage 3b: Secondary | ICD-10-CM

## 2023-10-28 DIAGNOSIS — E1122 Type 2 diabetes mellitus with diabetic chronic kidney disease: Secondary | ICD-10-CM

## 2023-12-01 ENCOUNTER — Other Ambulatory Visit: Payer: Self-pay | Admitting: Internal Medicine

## 2023-12-01 DIAGNOSIS — E785 Hyperlipidemia, unspecified: Secondary | ICD-10-CM

## 2023-12-01 DIAGNOSIS — I10 Essential (primary) hypertension: Secondary | ICD-10-CM

## 2023-12-01 DIAGNOSIS — J4520 Mild intermittent asthma, uncomplicated: Secondary | ICD-10-CM

## 2023-12-18 ENCOUNTER — Encounter: Payer: Self-pay | Admitting: Internal Medicine

## 2023-12-18 ENCOUNTER — Ambulatory Visit: Payer: Medicare PPO | Admitting: Internal Medicine

## 2023-12-18 VITALS — BP 132/70 | HR 78 | Temp 97.5°F | Ht 65.0 in | Wt 146.2 lb

## 2023-12-18 DIAGNOSIS — E1122 Type 2 diabetes mellitus with diabetic chronic kidney disease: Secondary | ICD-10-CM

## 2023-12-18 DIAGNOSIS — E039 Hypothyroidism, unspecified: Secondary | ICD-10-CM

## 2023-12-18 DIAGNOSIS — N1832 Chronic kidney disease, stage 3b: Secondary | ICD-10-CM | POA: Diagnosis not present

## 2023-12-18 DIAGNOSIS — K219 Gastro-esophageal reflux disease without esophagitis: Secondary | ICD-10-CM | POA: Diagnosis not present

## 2023-12-18 DIAGNOSIS — Z7984 Long term (current) use of oral hypoglycemic drugs: Secondary | ICD-10-CM | POA: Diagnosis not present

## 2023-12-18 DIAGNOSIS — I10 Essential (primary) hypertension: Secondary | ICD-10-CM

## 2023-12-18 LAB — CBC WITH DIFFERENTIAL/PLATELET
Basophils Absolute: 0.1 10*3/uL (ref 0.0–0.1)
Basophils Relative: 0.9 % (ref 0.0–3.0)
Eosinophils Absolute: 0.2 10*3/uL (ref 0.0–0.7)
Eosinophils Relative: 3.2 % (ref 0.0–5.0)
HCT: 44.9 % (ref 36.0–46.0)
Hemoglobin: 14.9 g/dL (ref 12.0–15.0)
Lymphocytes Relative: 12.7 % (ref 12.0–46.0)
Lymphs Abs: 0.9 10*3/uL (ref 0.7–4.0)
MCHC: 33.2 g/dL (ref 30.0–36.0)
MCV: 89.8 fL (ref 78.0–100.0)
Monocytes Absolute: 0.4 10*3/uL (ref 0.1–1.0)
Monocytes Relative: 6.1 % (ref 3.0–12.0)
Neutro Abs: 5.4 10*3/uL (ref 1.4–7.7)
Neutrophils Relative %: 77.1 % — ABNORMAL HIGH (ref 43.0–77.0)
Platelets: 335 10*3/uL (ref 150.0–400.0)
RBC: 5 Mil/uL (ref 3.87–5.11)
RDW: 13.9 % (ref 11.5–15.5)
WBC: 7.1 10*3/uL (ref 4.0–10.5)

## 2023-12-18 LAB — BASIC METABOLIC PANEL
BUN: 23 mg/dL (ref 6–23)
CO2: 25 meq/L (ref 19–32)
Calcium: 9.6 mg/dL (ref 8.4–10.5)
Chloride: 105 meq/L (ref 96–112)
Creatinine, Ser: 0.95 mg/dL (ref 0.40–1.20)
GFR: 56.75 mL/min — ABNORMAL LOW (ref >60.00)
Glucose, Bld: 114 mg/dL — ABNORMAL HIGH (ref 70–99)
Potassium: 4.1 meq/L (ref 3.5–5.1)
Sodium: 141 meq/L (ref 135–145)

## 2023-12-18 LAB — URINALYSIS, ROUTINE W REFLEX MICROSCOPIC
Bilirubin Urine: NEGATIVE
Hgb urine dipstick: NEGATIVE
Ketones, ur: NEGATIVE
Leukocytes,Ua: NEGATIVE
Nitrite: NEGATIVE
RBC / HPF: NONE SEEN (ref 0–?)
Specific Gravity, Urine: 1.025 (ref 1.000–1.030)
Total Protein, Urine: NEGATIVE
Urine Glucose: NEGATIVE
Urobilinogen, UA: 0.2 (ref 0.0–1.0)
pH: 5.5 (ref 5.0–8.0)

## 2023-12-18 LAB — HEMOGLOBIN A1C: Hgb A1c MFr Bld: 6.5 % (ref 4.6–6.5)

## 2023-12-18 LAB — TSH: TSH: 1.32 u[IU]/mL (ref 0.35–5.50)

## 2023-12-18 MED ORDER — KERENDIA 20 MG PO TABS: 1.0000 | ORAL_TABLET | Freq: Every day | ORAL | 1 refills | Status: DC

## 2023-12-18 MED ORDER — EMPAGLIFLOZIN 10 MG PO TABS: 10.0000 mg | ORAL_TABLET | Freq: Every day | ORAL | 1 refills | Status: DC

## 2023-12-18 MED ORDER — LEVOTHYROXINE SODIUM 75 MCG PO TABS: 75.0000 ug | ORAL_TABLET | Freq: Every day | ORAL | 1 refills | Status: DC

## 2023-12-18 NOTE — Progress Notes (Signed)
Subjective:  Patient ID: Natalie Shannon, female    DOB: 1941/02/17  Age: 82 y.o. MRN: [id number]  CC: Hypertension, Gastroesophageal Reflux, Hypothyroidism, Diabetes, and Hyperlipidemia   HPI Natalie Shannon presents for f/up -----  Discussed the use of AI scribe software for clinical note transcription with the patient, who gave verbal consent to proceed.  History of Present Illness   The patient, an 82 year old with a history of arthritis, presents with a recent exacerbation of her condition. She reports new onset of swelling and pain in multiple fingers, with the development of knots in two of them. The pain is persistent and has been unresponsive to Tylenol and warm compresses. Despite the discomfort, the patient has been able to maintain her daily activities, including exercise, without experiencing any chest pain, shortness of breath, dizziness, headaches, or blurred vision. She denies any swelling in her legs or feet.  The patient has also received her flu shot for the season. She denies any new symptoms related to her known medical conditions, including thyroid disease, diabetes, and kidney disease. She reports no nausea, vomiting, abdominal pain, diarrhea, or constipation. Despite her age and medical history, the patient feels well overall and does not perceive herself as a "sick human being."       Outpatient Medications Prior to Visit  Medication Sig Dispense Refill   Albuterol-Budesonide (AIRSUPRA) 90-80 MCG/ACT AERO Inhale 2 puffs into the lungs 4 (four) times daily as needed. 32.1 g 1   atorvastatin (LIPITOR) 10 MG tablet TAKE ONE TABLET BY MOUTH DAILY 90 tablet 0   augmented betamethasone dipropionate (DIPROLENE-AF) 0.05 % cream Apply topically daily.     budesonide (PULMICORT FLEXHALER) 180 MCG/ACT inhaler Inhale 2 puffs into the lungs 2 (two) times daily. 6 each 1   cycloSPORINE (RESTASIS) 0.05 % ophthalmic emulsion INSTILL ONE DROP IN EACH EYE TWICE DAILY     desonide  (DESOWEN) 0.05 % ointment APPLY TOPICALLY TO THE AFFECTED AREA TWICE DAILY FOR 14 DAYS. 15 g 2   famotidine (PEPCID) 40 MG tablet TAKE ONE TABLET BY MOUTH DAILY 90 tablet 1   losartan (COZAAR) 100 MG tablet TAKE ONE TABLET BY MOUTH DAILY 90 tablet 0   metroNIDAZOLE (METROCREAM) 0.75 % cream Apply topically 2 (two) times daily. 45 g 2   montelukast (SINGULAIR) 10 MG tablet TAKE ONE TABLET BY MOUTH AT BEDTIME 90 tablet 0   Finerenone (KERENDIA) 20 MG TABS TAKE 1 TABLET EVERY DAY 90 tablet 0   levothyroxine (SYNTHROID) 75 MCG tablet Take 1 tablet (75 mcg total) by mouth daily. 90 tablet 1   No facility-administered medications prior to visit.    ROS Review of Systems  Constitutional: Negative.  Negative for appetite change, diaphoresis, fatigue and unexpected weight change.  HENT: Negative.    Eyes: Negative.   Respiratory: Negative.  Negative for cough, chest tightness, shortness of breath and wheezing.   Cardiovascular:  Negative for chest pain, palpitations and leg swelling.  Gastrointestinal: Negative.  Negative for abdominal pain, constipation, diarrhea, nausea and vomiting.  Endocrine: Negative.   Genitourinary: Negative.  Negative for difficulty urinating.  Musculoskeletal:  Positive for arthralgias. Negative for joint swelling, myalgias and neck stiffness.  Skin: Negative.   Neurological: Negative.  Negative for dizziness and weakness.  Hematological:  Negative for adenopathy. Does not bruise/bleed easily.  Psychiatric/Behavioral: Negative.      Objective:  BP 132/70 (BP Location: Left Arm, Patient Position: Sitting, Cuff Size: Normal)   Pulse 78   Temp (!) 97.5  F (36.4 C) (Oral)   Ht 5\' 5"  (1.651 m)   Wt 146 lb 3.2 oz (66.3 kg)   SpO2 97%   BMI 24.33 kg/m   BP Readings from Last 3 Encounters:  12/18/23 132/70  06/19/23 122/68  12/16/22 (!) 140/78    Wt Readings from Last 3 Encounters:  12/18/23 146 lb 3.2 oz (66.3 kg)  06/19/23 145 lb (65.8 kg)  01/28/23 143 lb  (64.9 kg)    Physical Exam Vitals reviewed.  Constitutional:      Appearance: Normal appearance.  HENT:     Mouth/Throat:     Mouth: Mucous membranes are moist.  Eyes:     General: No scleral icterus.    Conjunctiva/sclera: Conjunctivae normal.  Cardiovascular:     Rate and Rhythm: Regular rhythm. Bradycardia present.     Heart sounds: No murmur heard.    No friction rub. No gallop.     Comments: EKG- SB, 59 bpm No LVH, Q waves, or ST/T waves  Pulmonary:     Effort: Pulmonary effort is normal.     Breath sounds: No stridor. No wheezing, rhonchi or rales.  Abdominal:     General: Abdomen is flat.     Palpations: There is no mass.     Tenderness: There is no abdominal tenderness. There is no guarding.     Hernia: No hernia is present.  Musculoskeletal:        General: Normal range of motion.     Cervical back: Neck supple. No rigidity.     Right lower leg: No edema.     Left lower leg: No edema.  Skin:    General: Skin is warm and dry.  Neurological:     General: No focal deficit present.     Mental Status: She is alert.  Psychiatric:        Mood and Affect: Mood normal.        Behavior: Behavior normal.     Lab Results  Component Value Date   WBC 7.1 12/18/2023   HGB 14.9 12/18/2023   HCT 44.9 12/18/2023   PLT 335.0 12/18/2023   GLUCOSE 114 (H) 12/18/2023   CHOL 158 06/19/2023   TRIG 211.0 (H) 06/19/2023   HDL 47.60 06/19/2023   LDLDIRECT 88.0 06/19/2023   LDLCALC 86 06/13/2022   ALT 21 06/19/2023   AST 24 06/19/2023   NA 141 12/18/2023   K 4.1 12/18/2023   CL 105 12/18/2023   CREATININE 0.95 12/18/2023   BUN 23 12/18/2023   CO2 25 12/18/2023   TSH 1.32 12/18/2023   INR 1.01 05/15/2012   HGBA1C 6.5 12/18/2023   MICROALBUR 1.4 06/19/2023    No results found.  Assessment & Plan:  Acquired hypothyroidism- She is euthyroid. -     TSH; Future -     Levothyroxine Sodium; Take 1 tablet (75 mcg total) by mouth daily.  Dispense: 90 tablet; Refill:  1  Type 2 diabetes mellitus with stage 3b chronic kidney disease, without long-term current use of insulin (HCC) - Her blood sugar is well controlled. -     Hemoglobin A1c; Future -     Basic metabolic panel; Future -     Urinalysis, Routine w reflex microscopic; Future -     Chauncey Mann; Take 1 tablet (20 mg total) by mouth daily.  Dispense: 90 tablet; Refill: 1 -     Empagliflozin; Take 1 tablet (10 mg total) by mouth daily before breakfast.  Dispense: 90 tablet; Refill: 1 -  AMB Referral [facility] Care Management  Stage 3b chronic kidney disease- Her renal function has improved -     Basic metabolic panel; Future -     Urinalysis, Routine w reflex microscopic; Future -     Chauncey Mann; Take 1 tablet (20 mg total) by mouth daily.  Dispense: 90 tablet; Refill: 1 -     Empagliflozin; Take 1 tablet (10 mg total) by mouth daily before breakfast.  Dispense: 90 tablet; Refill: 1 -     AMB Referral [facility] Care Management  Gastroesophageal reflux disease without esophagitis -     CBC with Differential/Platelet; Future  Essential hypertension - Her BP is well controlled. EKG is negative for LVH. -     Basic metabolic panel; Future -     CBC with Differential/Platelet; Future -     Urinalysis, Routine w reflex microscopic; Future -     EKG 12-Lead     Follow-up: Return in about 6 months (around 06/17/2024).  Sanda Linger, MD

## 2023-12-18 NOTE — Patient Instructions (Signed)
Bradycardia, Adult Bradycardia is a slower-than-normal heartbeat. A normal resting heart rate for an adult ranges from 60 to 100 beats per minute. With bradycardia, the resting heart rate is less than 60 beats per minute. Bradycardia can prevent enough oxygen from reaching certain areas of your body when you are active. It can be serious if it keeps enough oxygen from reaching your brain and other parts of your body. Bradycardia is not a problem for everyone. For some healthy adults, a slow resting heart rate is normal. What are the causes? This condition may be caused by: A problem with the heart, including: A problem with the heart's electrical system, such as a heart block. With a heart block, electrical signals between the chambers of the heart are partially or completely blocked, so they are not able to work as they should. A problem with the heart's natural pacemaker (sinus node). Heart disease. A heart attack. Heart damage. Lyme disease. A heart infection. A heart condition that is present at birth (congenital heart defect). Certain medicines that treat heart conditions. Certain conditions, such as hypothyroidism and obstructive sleep apnea. Problems with the balance of chemicals and other substances, like potassium, in the blood. Trauma. Radiation therapy. What increases the risk? You are more likely to develop this condition if you: Are age 65 or older. Have high blood pressure (hypertension), high cholesterol (hyperlipidemia), or diabetes. Drink heavily, use tobacco or nicotine products, or use drugs. What are the signs or symptoms? Symptoms of this condition include: Light-headedness. Feeling faint or fainting. Fatigue and weakness. Trouble with activity or exercise. Shortness of breath. Chest pain (angina). Drowsiness. Confusion. Dizziness. How is this diagnosed? This condition may be diagnosed based on: Your symptoms. Your medical history. A physical exam. During  the exam, your health care provider will listen to your heartbeat and check your pulse. To confirm the diagnosis, your health care provider may order tests, such as: Blood tests. An electrocardiogram (ECG). This test records the heart's electrical activity. The test can show how fast your heart is beating and whether the heartbeat is steady. A test in which you wear a portable device (event recorder or Holter monitor) to record your heart's electrical activity while you go about your day. An exercise test. How is this treated? Treatment for this condition depends on the cause of the condition and how severe your symptoms are. Treatment may involve: Treatment of the underlying condition. Changing your medicines or how much medicine you take. Having a small, battery-operated device called a pacemaker implanted under the skin. When bradycardia occurs, this device can be used to increase your heart rate and help your heart beat in a regular rhythm. Follow these instructions at home: Lifestyle Manage any health conditions that contribute to bradycardia as told by your health care provider. Follow a heart-healthy diet. A nutrition specialist (dietitian) can help educate you about healthy food options and changes. Follow an exercise program that is approved by your health care provider. Maintain a healthy weight. Try to reduce or manage your stress, such as with yoga or meditation. If you need help reducing stress, ask your health care provider. Do not use any products that contain nicotine or tobacco. These products include cigarettes, chewing tobacco, and vaping devices, such as e-cigarettes. If you need help quitting, ask your health care provider. Do not use illegal drugs. Alcohol use If you drink alcohol: Limit how much you have to: 0-1 drink a day for women who are not pregnant. 0-2 drinks a day   for men. Know how much alcohol is in a drink. In the [place] one drink equals one 12 oz bottle of  beer (355 mL), one 5 oz glass of wine (148 mL), or one 1 oz glass of hard liquor (44 mL). General instructions Take over-the-counter and prescription medicines only as told by your health care provider. Keep all follow-up visits. This is important. How is this prevented? In some cases, bradycardia may be prevented by: Treating underlying medical problems. Stopping behaviors or medicines that can trigger the condition. Contact a health care provider if: You feel light-headed or dizzy. You almost faint. You feel weak or are easily fatigued during physical activity. You experience confusion or have memory problems. Get help right away if: You faint. You have chest pains or an irregular heartbeat (palpitations). You have trouble breathing. These symptoms may represent a serious problem that is an emergency. Do not wait to see if the symptoms will go away. Get medical help right away. Call your local emergency services (911 in the [place] Do not drive yourself to the hospital. Summary Bradycardia is a slower-than-normal heartbeat. With bradycardia, the resting heart rate is less than 60 beats per minute. Treatment for this condition depends on the cause. Manage any health conditions that contribute to bradycardia as told by your health care provider. Do not use any products that contain nicotine or tobacco. These products include cigarettes, chewing tobacco, and vaping devices, such as e-cigarettes. Keep all follow-up visits. This is important. This information is not intended to replace advice given to you by your health care provider. Make sure you discuss any questions you have with your health care provider. Document Revised: 04/08/2021 Document Reviewed: 04/08/2021 Elsevier Patient Education  2024 [employer]  

## 2023-12-19 ENCOUNTER — Encounter: Payer: Self-pay | Admitting: Internal Medicine

## 2023-12-22 ENCOUNTER — Telehealth: Payer: Self-pay

## 2023-12-22 NOTE — Progress Notes (Signed)
Care Guide Pharmacy Note  12/22/2023 Name: OZELLA VANHOOZER MRN: [id number] DOB: [birth year]  Referred By: Etta Grandchild, MD Reason for referral: Care Coordination (Outreach to schedule with Pharm d )   Natalie Shannon is a 82 y.o. year old female who is a primary care patient of Etta Grandchild, MD.  Octavia Heir was referred to the pharmacist for assistance related to: DMII  Successful contact was made with the patient to discuss pharmacy services including being ready for the pharmacist to call at least 5 minutes before the scheduled appointment time and to have medication bottles and any blood pressure readings ready for review. The patient agreed to meet with the pharmacist via telephone visit on (date/time).01/08/2024  Penne Lash , RMA     [place]  [facility] Guide  Direct Dial: [phone]  Website: [email or web address]

## 2024-01-08 ENCOUNTER — Other Ambulatory Visit: Payer: Medicare PPO | Admitting: Pharmacist

## 2024-01-08 DIAGNOSIS — E1122 Type 2 diabetes mellitus with diabetic chronic kidney disease: Secondary | ICD-10-CM

## 2024-01-08 NOTE — Progress Notes (Signed)
 01/08/2024 Name: Natalie Shannon MRN: [id number] DOB: 1941/04/20  Chief Complaint  Patient presents with   Diabetes   Medication Management    Natalie Shannon is a 83 y.o. year old female who presented for a telephone visit.   They were referred to the pharmacist by their PCP for assistance in managing diabetes.    Subjective:  Care Team: Primary Care Provider: Joshua Debby CROME, MD ; Next Scheduled Visit: not scheduled  Medication Access/Adherence  Current Pharmacy:  [facility] [place], [place] - [address] [facility] [address] [place] [address] Phone: [phone] Fax: [phone]  [facility] - [place], [place] - [address] [place] [address] Phone: [phone] Fax: [phone]   Patient reports affordability concerns with their medications: No  Patient reports access/transportation concerns to their pharmacy: No  Patient reports adherence concerns with their medications:  No     Diabetes/CKD:  Current medications: Kerendia  20 mg daily, Losartan  100 mg daily *Pt did not start Jardiance  - she is very fearful of taking due to risk of UTI with the medication as she states she has a UTI for 2 years in the past and will not risk it  Diet: Follows with a dietitian yearly and pays close attention to her carbohydrate intake   Objective:  Lab Results  Component Value Date   HGBA1C 6.5 12/18/2023    Lab Results  Component Value Date   CREATININE 0.95 12/18/2023   BUN 23 12/18/2023   NA 141 12/18/2023   K 4.1 12/18/2023   CL 105 12/18/2023   CO2 25 12/18/2023    Lab Results  Component Value Date   CHOL 158 06/19/2023   HDL 47.60 06/19/2023   LDLCALC 86 06/13/2022   LDLDIRECT 88.0 06/19/2023   TRIG 211.0 (H) 06/19/2023   CHOLHDL 3 06/19/2023    Medications Reviewed Today     Reviewed by Merceda Lela SAUNDERS, RPH (Pharmacist) on 01/08/24 at 1620  Med List  Status: <None>   Medication Order Taking? Sig Documenting Provider Last Dose Status Informant  Albuterol -Budesonide  (AIRSUPRA ) 90-80 MCG/ACT AERO [id number] Yes Inhale 2 puffs into the lungs 4 (four) times daily as needed. Joshua Debby CROME, MD Taking Active   atorvastatin  (LIPITOR) 10 MG tablet [id number] Yes TAKE ONE TABLET BY MOUTH DAILY Joshua Debby CROME, MD Taking Active   augmented betamethasone dipropionate (DIPROLENE-AF) 0.05 % cream [id number]  Apply topically daily. [provider]  Active   budesonide  (PULMICORT  FLEXHALER) 180 MCG/ACT inhaler [id number] Yes Inhale 2 puffs into the lungs 2 (two) times daily. Joshua Debby CROME, MD Taking Active   cycloSPORINE (RESTASIS) 0.05 % ophthalmic emulsion [id number]  INSTILL ONE DROP IN EACH EYE TWICE DAILY [provider]  Active   desonide (DESOWEN) 0.05 % ointment [id number]  APPLY TOPICALLY TO THE AFFECTED AREA TWICE DAILY FOR 14 DAYS. Joshua Debby CROME, MD  Active   empagliflozin  (JARDIANCE ) 10 MG TABS tablet [id number] No Take 1 tablet (10 mg total) by mouth daily before breakfast.  Patient not taking: Reported on 01/08/2024   Joshua Debby CROME, MD Not Taking Active   famotidine  (PEPCID ) 40 MG tablet [id number] Yes TAKE ONE TABLET BY MOUTH DAILY Joshua Debby CROME, MD Taking Active   Finerenone  (KERENDIA ) 20 MG TABS [id number] Yes Take 1 tablet (20 mg total) by mouth daily. Joshua Debby CROME, MD Taking Active   levothyroxine  (SYNTHROID ) 75 MCG tablet [id number] Yes Take  1 tablet (75 mcg total) by mouth daily. Joshua Debby CROME, MD Taking Active   losartan  (COZAAR ) 100 MG tablet [id number] Yes TAKE ONE TABLET BY MOUTH DAILY Joshua Debby CROME, MD Taking Active   metroNIDAZOLE  (METROCREAM ) 0.75 % cream [id number]  Apply topically 2 (two) times daily. Joshua Debby CROME, MD  Active   montelukast  (SINGULAIR ) 10 MG tablet [id number] Yes TAKE ONE TABLET BY MOUTH AT BEDTIME Joshua Debby CROME, MD Taking Active               Assessment/Plan:   Diabetes/CKD: -  Currently controlled, Goal A1c <7% - Given pt's controlled A1c, hx of UTI, and already on losartan  and Kerendia  for renal protection, it is reasonable to not start Jardiance  - Did notify patient that although her A1c is controlled, it did increase 0.5%. She plans to follow up with her dietitian for this.    Follow Up Plan: PRN  Darrelyn Drum, PharmD, BCPS, CPP Clinical Pharmacist Practitioner [facility] [phone]

## 2024-01-29 ENCOUNTER — Other Ambulatory Visit: Payer: Self-pay | Admitting: Internal Medicine

## 2024-01-29 DIAGNOSIS — N1832 Chronic kidney disease, stage 3b: Secondary | ICD-10-CM

## 2024-02-03 ENCOUNTER — Ambulatory Visit (INDEPENDENT_AMBULATORY_CARE_PROVIDER_SITE_OTHER): Payer: Medicare PPO

## 2024-02-03 VITALS — Ht 65.0 in | Wt 146.0 lb

## 2024-02-03 DIAGNOSIS — Z0000 Encounter for general adult medical examination without abnormal findings: Secondary | ICD-10-CM | POA: Diagnosis not present

## 2024-02-03 NOTE — Progress Notes (Signed)
 Subjective:   Natalie Shannon is a 83 y.o. female who presents for Medicare Annual (Subsequent) preventive examination.  Visit Complete: Virtual I connected with  Natalie Shannon on 02/03/24 by a audio enabled telemedicine application and verified that I am speaking with the correct person using two identifiers.  Patient Location: Home  Provider Location: Home Office  I discussed the limitations of evaluation and management by telemedicine. The patient expressed understanding and agreed to proceed.  Vital Signs: Because this visit was a virtual/telehealth visit, some criteria may be missing or patient reported. Any vitals not documented were not able to be obtained and vitals that have been documented are patient reported.    Cardiac Risk Factors include: advanced age (>62men, >59 women);hypertension;Other (see comment);diabetes mellitus;dyslipidemia, Risk factor comments: [place], CKD     Objective:    Today's Vitals   02/03/24 1442  Weight: 146 lb (66.2 kg)  Height: 5' 5 (1.651 m)   Body mass index is 24.3 kg/m.     02/03/2024    3:05 PM 01/28/2023    2:27 PM 01/16/2022    2:55 PM 11/05/2021    8:45 AM 10/03/2020   12:55 PM 12/04/2017    2:16 PM 11/01/2016    2:37 PM  Advanced Directives  Does Patient Have a Medical Advance Directive? Yes Yes No Yes Yes Yes Yes  Type of Estate Agent of [place];Living will Living will;Healthcare Power of [employer] Power of Agency;Living will  Does patient want to make changes to medical advance directive?     No - Patient declined Yes (MAU/Ambulatory/Procedural Areas - Information given) No - Patient declined  Copy of Healthcare Power of Attorney in Chart? No - copy requested No - copy requested     Yes  Would patient like information on creating a medical advance directive?   No - Patient declined        Current Medications (verified) Outpatient Encounter Medications as of 02/03/2024  Medication Sig    Albuterol -Budesonide  (AIRSUPRA ) 90-80 MCG/ACT AERO Inhale 2 puffs into the lungs 4 (four) times daily as needed.   atorvastatin  (LIPITOR) 10 MG tablet TAKE ONE TABLET BY MOUTH DAILY   augmented betamethasone dipropionate (DIPROLENE-AF) 0.05 % cream Apply topically daily.   budesonide  (PULMICORT  FLEXHALER) 180 MCG/ACT inhaler Inhale 2 puffs into the lungs 2 (two) times daily.   cycloSPORINE (RESTASIS) 0.05 % ophthalmic emulsion INSTILL ONE DROP IN EACH EYE TWICE DAILY   desonide (DESOWEN) 0.05 % ointment APPLY TOPICALLY TO THE AFFECTED AREA TWICE DAILY FOR 14 DAYS.   famotidine  (PEPCID ) 40 MG tablet TAKE ONE TABLET BY MOUTH DAILY   levothyroxine  (SYNTHROID ) 75 MCG tablet Take 1 tablet (75 mcg total) by mouth daily.   losartan  (COZAAR ) 100 MG tablet TAKE ONE TABLET BY MOUTH DAILY   metroNIDAZOLE  (METROCREAM ) 0.75 % cream Apply topically 2 (two) times daily.   montelukast  (SINGULAIR ) 10 MG tablet TAKE ONE TABLET BY MOUTH AT BEDTIME   KERENDIA  20 MG TABS TAKE 1 TABLET EVERY DAY (Patient not taking: Reported on 02/03/2024)   No facility-administered encounter medications on file as of 02/03/2024.    Allergies (verified) Latex, Povidone-iodine, Simvastatin , Sulfonamide derivatives, Azithromycin , and Doxycycline    History: Past Medical History:  Diagnosis Date   Asthma    Diabetes mellitus without complication (HCC)    GERD (gastroesophageal reflux disease)    History of vertebral fracture 2013   Hyperlipidemia    Hypertension    Pneumothorax, traumatic 1993  rib fracture x4 (thrown from horse)   Urticaria    ? solar induced   Past Surgical History:  Procedure Laterality Date   COLONOSCOPY     Neg X3, benign polyps ;due 2019, Dr Jakie   DILATION AND CURETTAGE OF UTERUS  02/2007    Dr Leva   TONSILLECTOMY     Family History  Problem Relation Age of Onset   Asthma Father    Diabetes Mother    Stroke Mother 53       post carotid endarterectomy   Hypertension Mother     Atrial fibrillation Brother    Kidney disease Brother        idiopathic   Stroke Maternal Grandfather        in 64s   Heart attack Maternal Uncle        after 45   Breast cancer Maternal Aunt    Social History   Socioeconomic History   Marital status: Divorced    Spouse name: Not on file   Number of children: Not on file   Years of education: Not on file   Highest education level: Not on file  Occupational History   Occupation: Retired Engineer, Civil (consulting)  Tobacco Use   Smoking status: Former    Current packs/day: 0.00    Types: Cigarettes    Quit date: 12/31/1967    Years since quitting: 56.1   Smokeless tobacco: Former   Tobacco comments:    smoked 1965-1969, up 1/2 ppd  Vaping Use   Vaping status: Never Used  Substance and Sexual Activity   Alcohol use: No   Drug use: No   Sexual activity: Not Currently  Other Topics Concern   Not on file  Social History Narrative   Please schedule all AWV via phone per patient's request.      Lives alone   Social Drivers of Health   Financial Resource Strain: Low Risk  (02/03/2024)   Overall Financial Resource Strain (CARDIA)    Difficulty of Paying Living Expenses: Not hard at all  Food Insecurity: No Food Insecurity (02/03/2024)   Hunger Vital Sign    Worried About Running Out of Food in the Last Year: Never true    Ran Out of Food in the Last Year: Never true  Transportation Needs: No Transportation Needs (02/03/2024)   PRAPARE - Administrator, Civil Service (Medical): No    Lack of Transportation (Non-Medical): No  Physical Activity: Sufficiently Active (02/03/2024)   Exercise Vital Sign    Days of Exercise per Week: 7 days    Minutes of Exercise per Session: 60 min  Stress: No Stress Concern Present (02/03/2024)   [employer] of Occupational Health - Occupational Stress Questionnaire    Feeling of Stress : Not at all  Social Connections: Socially Isolated (02/03/2024)   Social Connection and Isolation Panel [NHANES]     Frequency of Communication with Friends and Family: Three times a week    Frequency of Social Gatherings with Friends and Family: More than three times a week    Attends Religious Services: Never    Database Administrator or Organizations: No    Attends Banker Meetings: Never    Marital Status: Divorced    Tobacco Counseling Counseling given: Not Answered Tobacco comments: smoked 1965-1969, up 1/2 ppd   Clinical Intake:  Pre-visit preparation completed: Yes  Pain : No/denies pain     BMI - recorded: 24.3 Nutritional Status: BMI of 19-24  Normal  Nutritional Risks: None Diabetes: Yes CBG done?: No Did pt. bring in CBG monitor from home?: No  How often do you need to have someone help you when you read instructions, pamphlets, or other written materials from your doctor or pharmacy?: 1 - Never     Information entered by :: Tyera Hansley, RMA   Activities of Daily Living    02/03/2024    2:43 PM  In your present state of health, do you have any difficulty performing the following activities:  Hearing? 0  Vision? 0  Difficulty concentrating or making decisions? 0  Walking or climbing stairs? 0  Dressing or bathing? 0  Doing errands, shopping? 0  Preparing Food and eating ? N  Using the Toilet? N  In the past six months, have you accidently leaked urine? N  Do you have problems with loss of bowel control? N  Managing your Medications? N  Managing your Finances? N  Housekeeping or managing your Housekeeping? N    Patient Care Team: Joshua Debby CROME, MD as PCP - General (Internal Medicine) Lavonia Lye, MD as Consulting Physician (Ophthalmology) Tricia, Tawni CROME, MD as Referring Physician (Dermatology) Leva Rush, MD as Consulting Physician (Obstetrics and Gynecology) Imaging, [facility] (Diagnostic Radiology)  Indicate any recent Medical Services you may have received from other than [facility] providers in the past year  (date may be approximate).     Assessment:   This is a routine wellness examination for [facility].  Hearing/Vision screen Hearing Screening - Comments:: Denies hearing difficulties   Vision Screening - Comments:: Denies vision issues.    Goals Addressed               This Visit's Progress     Patient Stated (pt-stated)   On track     Yes; to get rid of those 2 pounds and continue working with nutritionist.       Depression Screen    02/03/2024    3:13 PM 01/28/2023    2:20 PM 01/16/2022    2:56 PM 11/05/2021    8:45 AM 10/03/2020   12:51 PM 03/13/2020    2:50 PM 12/08/2018    2:01 PM  PHQ 2/9 Scores  PHQ - 2 Score 0 1 0 0 0 0 0  PHQ- 9 Score 1          Fall Risk    02/03/2024    3:05 PM 01/28/2023    2:15 PM 01/16/2022    2:56 PM 11/05/2021    8:45 AM 10/03/2020   12:49 PM  Fall Risk   Falls in the past year? 0 0 0 0 0  Number falls in past yr: 0 0 0  0  Injury with Fall? 0 0 0  0  Risk for fall due to : No Fall Risks No Fall Risks No Fall Risks  No Fall Risks  Follow up Falls prevention discussed;Falls evaluation completed Education provided;Falls prevention discussed Falls evaluation completed  Falls evaluation completed    MEDICARE RISK AT HOME: Medicare Risk at Home Any stairs in or around the home?: Yes If so, are there any without handrails?: Yes Home free of loose throw rugs in walkways, pet beds, electrical cords, etc?: Yes Adequate lighting in your home to reduce risk of falls?: Yes Life alert?: No Use of a cane, walker or w/c?: No Grab bars in the bathroom?: Yes Shower chair or bench in shower?: Yes Elevated toilet seat or a handicapped toilet?: Yes  TIMED  UP AND GO:  Was the test performed?  No    Cognitive Function:        02/03/2024    2:44 PM 01/28/2023    2:35 PM  6CIT Screen  What Year? 0 points 0 points  What month? 0 points 0 points  What time? 0 points 0 points  Count back from 20 0 points 0 points  Months in reverse 2 points 0 points   Repeat phrase 10 points 0 points  Total Score 12 points 0 points    Immunizations Immunization History  Administered Date(s) Administered   Fluad Quad(high Dose 65+) 09/13/2020, 09/13/2021   Fluad Trivalent(High Dose 65+) 09/29/2023   H1N1 01/12/2009   Influenza Split 10/15/2011, 09/29/2012, 10/07/2014, 09/30/2015   Influenza Whole 10/24/2009, 09/21/2010   Influenza, High Dose Seasonal PF 09/15/2017, 09/18/2018, 12/14/2019, 12/14/2019, 09/26/2022   Influenza,inj,Quad PF,6+ Mos 10/21/2013   Influenza-Unspecified 09/29/2016   Moderna SARS-COV2 Booster Vaccination 10/06/2020   Moderna Sars-Covid-2 Vaccination 01/31/2020, 02/27/2020   Pneumococcal Conjugate-13 10/30/2016   Pneumococcal Polysaccharide-23 12/30/2000, 09/28/2008, 03/13/2017, 12/16/2022   Td 05/12/1994, 12/30/2002   Tdap 08/10/2012   Zoster Recombinant(Shingrix) 12/14/2019, 04/05/2020   Zoster, Live 06/24/2008    TDAP status: Due, Education has been provided regarding the importance of this vaccine. Advised may receive this vaccine at local pharmacy or Health Dept. Aware to provide a copy of the vaccination record if obtained from local pharmacy or Health Dept. Verbalized acceptance and understanding.  Flu Vaccine status: Up to date  Pneumococcal vaccine status: Up to date  Covid-19 vaccine status: Declined, Education has been provided regarding the importance of this vaccine but patient still declined. Advised may receive this vaccine at local pharmacy or Health Dept.or vaccine clinic. Aware to provide a copy of the vaccination record if obtained from local pharmacy or Health Dept. Verbalized acceptance and understanding.  Qualifies for Shingles Vaccine? Yes   Zostavax completed Yes   Shingrix Completed?: Yes  Screening Tests Health Maintenance  Topic Date Due   DTaP/Tdap/Td (4 - Td or Tdap) 08/10/2022   COVID-19 Vaccine (4 - 2024-25 season) 08/31/2023   OPHTHALMOLOGY EXAM  12/19/2023   HEMOGLOBIN A1C   06/17/2024   Diabetic kidney evaluation - Urine ACR  06/18/2024   FOOT EXAM  06/18/2024   Diabetic kidney evaluation - eGFR measurement  12/17/2024   Medicare Annual Wellness (AWV)  02/02/2025   Pneumonia Vaccine 31+ Years old  Completed   INFLUENZA VACCINE  Completed   DEXA SCAN  Completed   Zoster Vaccines- Shingrix  Completed   HPV VACCINES  Aged Out   Hepatitis C Screening  Discontinued    Health Maintenance  Health Maintenance Due  Topic Date Due   DTaP/Tdap/Td (4 - Td or Tdap) 08/10/2022   COVID-19 Vaccine (4 - 2024-25 season) 08/31/2023   OPHTHALMOLOGY EXAM  12/19/2023    Colorectal cancer screening: No longer required.   Mammogram status: Completed 12/12/2021. Repeat every year  Bone Density status: Ordered 02/03/2024. Pt provided with contact info and advised to call to schedule appt.  Lung Cancer Screening: (Low Dose CT Chest recommended if Age 81-80 years, 20 pack-year currently smoking OR have quit w/in 15years.) does not qualify.   Lung Cancer Screening Referral: N/A  Additional Screening:  Hepatitis C Screening: does not qualify;   Vision Screening: Recommended annual ophthalmology exams for early detection of glaucoma and other disorders of the eye. Is the patient up to date with their annual eye exam?  No  Who is the  provider or what is the name of the office in which the patient attends annual eye exams? Coming up this year with Dr. Lavonia If pt is not established with a provider, would they like to be referred to a provider to establish care? No .   Dental Screening: Recommended annual dental exams for proper oral hygiene  Diabetic Foot Exam: Diabetic Foot Exam: Completed 06/19/2023  Community Resource Referral / Chronic Care Management: CRR required this visit?  No   CCM required this visit?  No     Plan:     I have personally reviewed and noted the following in the patient's chart:   Medical and social history Use of alcohol, tobacco or  illicit drugs  Current medications and supplements including opioid prescriptions. Patient is not currently taking opioid prescriptions. Functional ability and status Nutritional status Physical activity Advanced directives List of other physicians Hospitalizations, surgeries, and ER visits in previous 12 months Vitals Screenings to include cognitive, depression, and falls Referrals and appointments  In addition, I have reviewed and discussed with patient certain preventive protocols, quality metrics, and best practice recommendations. A written personalized care plan for preventive services as well as general preventive health recommendations were provided to patient.     Adah Stoneberg L Kasean Denherder, CMA   02/03/2024   After Visit Summary: (Mail) Due to this being a telephonic visit, the after visit summary with patients personalized plan was offered to patient via mail   Nurse Notes: Patient is due for a Tdap.  Patient stated that she is late getting her mammogram and her DEXA and eye exam due to the bad weather in December 2024.  Patient stated that she has appointments for the exams needed coming up this year, 2025 and will request that records be sent to Dr. Joshua to fill-in gaps.  She had no other concerns to address today.

## 2024-02-03 NOTE — Patient Instructions (Addendum)
 Natalie Shannon , Thank you for taking time to come for your Medicare Wellness Visit. I appreciate your ongoing commitment to your health goals. Please review the following plan we discussed and let me know if I can assist you in the future.   Referrals/Orders/Follow-Ups/Clinician Recommendations: It was nice talking to you today.  Remember to have your record for results of Bone density, Mammogram and eye exam sent to Dr. Tamala office to update your chart.  Each day, aim for 6 glasses of water, plenty of protein in your diet and try to get up and walk/ stretch every hour for 5-10 minutes at a time.    This is a list of the screening recommended for you and due dates:  Health Maintenance  Topic Date Due   DTaP/Tdap/Td vaccine (4 - Td or Tdap) 08/10/2022   COVID-19 Vaccine (4 - 2024-25 season) 08/31/2023   Eye exam for diabetics  12/19/2023   Medicare Annual Wellness Visit  01/29/2024   Hemoglobin A1C  06/17/2024   Yearly kidney health urinalysis for diabetes  06/18/2024   Complete foot exam   06/18/2024   Yearly kidney function blood test for diabetes  12/17/2024   Pneumonia Vaccine  Completed   Flu Shot  Completed   DEXA scan (bone density measurement)  Completed   Zoster (Shingles) Vaccine  Completed   HPV Vaccine  Aged Out   Hepatitis C Screening  Discontinued    Advanced directives: (Copy Requested) Please bring a copy of your health care power of attorney and living will to the office to be added to your chart at your convenience.  Next Medicare Annual Wellness Visit scheduled for next year: Yes

## 2024-02-05 ENCOUNTER — Telehealth: Payer: Self-pay | Admitting: Internal Medicine

## 2024-02-05 NOTE — Telephone Encounter (Signed)
 Copied from CRM [id number]. Topic: Medical Record Request - Records Request >> Feb 05, 2024  2:40 PM Russell PARAS wrote: Reason for CRM: Patient is calling in due to some missing imaging and vaccine records that were brought up during her AWV.   She is needing the clinic to contact [facility] to obtain the imaging from her mammogram performed in 2023. Contact number: [phone].  She is also missing the record that she received her Tdap vaccine in 2023 at the [facility] on [address]. Contact number: [phone]

## 2024-02-09 NOTE — Telephone Encounter (Signed)
 Unable to reach patient. LMTRC

## 2024-02-10 NOTE — Telephone Encounter (Signed)
Advised the patient that she would have to sign a records release for so that we could request her records. She gave a verbal understanding and stated that she would come in sometime next week and sign it.

## 2024-03-01 ENCOUNTER — Other Ambulatory Visit: Payer: Self-pay | Admitting: Internal Medicine

## 2024-03-01 DIAGNOSIS — I10 Essential (primary) hypertension: Secondary | ICD-10-CM

## 2024-03-01 DIAGNOSIS — E785 Hyperlipidemia, unspecified: Secondary | ICD-10-CM

## 2024-03-01 DIAGNOSIS — J4520 Mild intermittent asthma, uncomplicated: Secondary | ICD-10-CM

## 2024-03-01 DIAGNOSIS — K219 Gastro-esophageal reflux disease without esophagitis: Secondary | ICD-10-CM

## 2024-03-16 ENCOUNTER — Telehealth: Payer: Self-pay

## 2024-03-16 NOTE — Telephone Encounter (Signed)
 I can't see the encounter of her last visit.

## 2024-03-16 NOTE — Telephone Encounter (Signed)
 Copied from CRM [id number]. Topic: Referral - Request for Referral >> Mar 16, 2024 10:48 AM Marica Otter wrote: Did the patient discuss referral with their provider in the last year? Yes (If No - schedule appointment) (If Yes - send message)  Appointment offered? No  Type of order/referral and detailed reason for visit: Nutritionist  Preference of office, provider, location: [facility] Jilda Panda [phone]  If referral order, have you been seen by this specialty before? Yes, has been going the last couple days they need a new referral for this year (If Yes, this issue or another issue? When? Where?  Can we respond through MyChart? No

## 2024-03-22 NOTE — Telephone Encounter (Signed)
 Ok to renew referral to nutritionist.

## 2024-03-23 NOTE — Telephone Encounter (Signed)
 What Diagnosis am I using ?

## 2024-03-29 NOTE — Telephone Encounter (Signed)
 Type 2 DM and hypertension

## 2024-03-31 DIAGNOSIS — Z1231 Encounter for screening mammogram for malignant neoplasm of breast: Secondary | ICD-10-CM | POA: Diagnosis not present

## 2024-04-01 ENCOUNTER — Other Ambulatory Visit: Payer: Self-pay

## 2024-04-01 ENCOUNTER — Other Ambulatory Visit: Payer: Self-pay | Admitting: Internal Medicine

## 2024-04-01 DIAGNOSIS — N1832 Chronic kidney disease, stage 3b: Secondary | ICD-10-CM

## 2024-04-01 NOTE — Telephone Encounter (Signed)
Please place the referral

## 2024-04-20 DIAGNOSIS — H16223 Keratoconjunctivitis sicca, not specified as Sjogren's, bilateral: Secondary | ICD-10-CM | POA: Diagnosis not present

## 2024-04-20 DIAGNOSIS — Z961 Presence of intraocular lens: Secondary | ICD-10-CM | POA: Diagnosis not present

## 2024-04-20 DIAGNOSIS — H43813 Vitreous degeneration, bilateral: Secondary | ICD-10-CM | POA: Diagnosis not present

## 2024-04-20 DIAGNOSIS — H26493 Other secondary cataract, bilateral: Secondary | ICD-10-CM | POA: Diagnosis not present

## 2024-04-26 ENCOUNTER — Encounter: Attending: Internal Medicine | Admitting: Dietician

## 2024-04-26 ENCOUNTER — Encounter: Payer: Self-pay | Admitting: Dietician

## 2024-04-26 DIAGNOSIS — E1122 Type 2 diabetes mellitus with diabetic chronic kidney disease: Secondary | ICD-10-CM | POA: Insufficient documentation

## 2024-04-26 DIAGNOSIS — N1832 Chronic kidney disease, stage 3b: Secondary | ICD-10-CM | POA: Insufficient documentation

## 2024-04-26 NOTE — Patient Instructions (Addendum)
 Small amount of protein with each meal.  Continue your good habits, eating well, exercising most days. Continue to be mindful about sodium    Natural Peanut Butter such as [employer] brand.

## 2024-04-26 NOTE — Progress Notes (Signed)
 Diabetes Self-Management Education  Visit Type: Follow-up  Appt. Start Time: 1400 Appt. End Time: 1430  05/03/2024  [name], identified by name and date of birth, is a 83 y.o. female with a diagnosis of Diabetes:  .   ASSESSMENT Patient is here today alone.  She was last seen by this RD on 04/10/2023.  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  She is now at her goal weight.  She gained weight after retirement. She is now maintaining her weight at 145 lbs 146 lbs 04/10/2023 145 lbs per patient 04/26/2024   Social History:  Patient lives alone.  Enjoys walking her dog and spends about 3 hours each day outside daily with the dog, walking and playing.  She also uses a peddler.  She does not cook and eats simple foods.  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  She used to have horses. Peddles 1-3 hours daily    Diabetes Self-Management Education - 05/03/24 1145       Visit Information   Visit Type Follow-up      Psychosocial Assessment   Patient Belief/Attitude about Diabetes Motivated to manage diabetes    What is the hardest part about your diabetes right now, causing you the most concern, or is the most worrisome to you about your diabetes?   Making healty food and beverage choices    Self-care barriers None    Self-management support Doctor's office    Other persons present Patient    Patient Concerns Nutrition/Meal planning    Special Needs None    Preferred Learning Style No preference indicated    Learning Readiness Ready    How often do you need to have someone help you when you read instructions, pamphlets, or other written  materials from your doctor or pharmacy? 1 - Never      Pre-Education Assessment   Patient understands the diabetes disease and treatment process. Demonstrates understanding / competency    Patient understands incorporating nutritional management into lifestyle. Comprehends key points    Patient undertands incorporating physical activity into lifestyle. Demonstrates understanding / competency    Patient understands using medications safely. Demonstrates understanding / competency    Patient understands monitoring blood glucose, interpreting and using results Demonstrates understanding / competency    Patient understands prevention, detection, and treatment of acute complications. Demonstrates understanding / competency    Patient understands prevention, detection, and treatment of chronic complications. Demonstrates understanding / competency    Patient understands how to develop strategies to address psychosocial issues. Demonstrates understanding / competency    Patient understands how to develop strategies to promote health/change behavior. Comprehends key points      Complications   Last HgB A1C per patient/outside source 6.5 %   12/17/2024   How often do you check your blood sugar? 0 times/day (not testing)      Dietary Intake   Lunch Slider with [employer] bread    Dinner [employer], salad with cheese and dressing    Beverage(s) diet cranberry juice, water, diet gingerale      Activity / Exercise   Activity / Exercise Type  Light (walking / raking leaves)    How many days per week do you exercise? 7    How many minutes per day do you exercise? 90    Total minutes per week of exercise 630      Patient Education   Previous Diabetes Education Yes (please comment)   04/10/2023   Healthy Eating Plate Method;Food label reading, portion sizes and measuring food.;Meal options for control of blood glucose level and chronic complications.    Diabetes Stress and Support Identified and addressed  patients feelings and concerns about diabetes;Worked with patient to identify barriers to care and solutions;Role of stress on diabetes      Individualized Goals (developed by patient)   Nutrition General guidelines for healthy choices and portions discussed    Physical Activity Exercise 5-7 days per week;60 minutes per day    Medications take my medication as prescribed    Monitoring  Not Applicable    Problem Solving Eating Pattern    Reducing Risk do foot checks daily      Patient Self-Evaluation of Goals - Patient rates self as meeting previously set goals (% of time)   Nutrition >75% (most of the time)    Physical Activity >75% (most of the time)    Medications >75% (most of the time)    Monitoring Not Applicable    Problem Solving and behavior change strategies  >75% (most of the time)    Reducing Risk (treating acute and chronic complications) >75% (most of the time)    Health Coping >75% (most of the time)      Post-Education Assessment   Patient understands the diabetes disease and treatment process. Demonstrates understanding / competency    Patient understands incorporating nutritional management into lifestyle. Comprehends key points    Patient undertands incorporating physical activity into lifestyle. Demonstrates understanding / competency    Patient understands using medications safely. Demonstrates understanding / competency    Patient understands monitoring blood glucose, interpreting and using results Demonstrates understanding / competency    Patient understands prevention, detection, and treatment of acute complications. Demonstrates understanding / competency    Patient understands prevention, detection, and treatment of chronic complications. Demonstrates understanding / competency    Patient understands how to develop strategies to address psychosocial issues. Demonstrates understanding / competency    Patient understands how to develop strategies to promote  health/change behavior. Comprehends key points      Outcomes   Expected Outcomes Demonstrated limited interest in learning.  Expect minimal changes    Future DMSE Yearly    Program Status Not Completed      Subsequent Visit   Since your last visit have you continued or begun to take your medications as prescribed? Yes    Since your last visit have you experienced any weight changes? Loss    Weight Loss (lbs) 1             Individualized Plan for Diabetes Self-Management Training:   Learning Objective:  Patient will have a greater understanding of diabetes self-management. Patient education plan is to attend individual and/or group sessions per assessed needs and concerns.   Plan:   Patient Instructions  Small amount of protein with each meal.  Continue your good habits, eating well, exercising most days. Continue to be mindful about sodium    Natural Peanut Butter such as [employer] brand.  Expected Outcomes:  Demonstrated limited interest in learning.  Expect minimal changes  Education material provided:   If problems or  questions, patient to contact team via:  Phone  Future DSME appointment: Yearly

## 2024-05-25 DIAGNOSIS — L578 Other skin changes due to chronic exposure to nonionizing radiation: Secondary | ICD-10-CM | POA: Diagnosis not present

## 2024-05-25 DIAGNOSIS — D225 Melanocytic nevi of trunk: Secondary | ICD-10-CM | POA: Diagnosis not present

## 2024-05-25 DIAGNOSIS — L309 Dermatitis, unspecified: Secondary | ICD-10-CM | POA: Diagnosis not present

## 2024-05-25 DIAGNOSIS — L570 Actinic keratosis: Secondary | ICD-10-CM | POA: Diagnosis not present

## 2024-05-25 DIAGNOSIS — D485 Neoplasm of uncertain behavior of skin: Secondary | ICD-10-CM | POA: Diagnosis not present

## 2024-05-25 DIAGNOSIS — L814 Other melanin hyperpigmentation: Secondary | ICD-10-CM | POA: Diagnosis not present

## 2024-05-25 DIAGNOSIS — L821 Other seborrheic keratosis: Secondary | ICD-10-CM | POA: Diagnosis not present

## 2024-05-28 ENCOUNTER — Other Ambulatory Visit: Payer: Self-pay | Admitting: Internal Medicine

## 2024-05-28 DIAGNOSIS — J4520 Mild intermittent asthma, uncomplicated: Secondary | ICD-10-CM

## 2024-05-28 DIAGNOSIS — I10 Essential (primary) hypertension: Secondary | ICD-10-CM

## 2024-05-28 DIAGNOSIS — E785 Hyperlipidemia, unspecified: Secondary | ICD-10-CM

## 2024-06-02 ENCOUNTER — Encounter: Payer: Self-pay | Admitting: Internal Medicine

## 2024-06-02 ENCOUNTER — Ambulatory Visit: Admitting: Internal Medicine

## 2024-06-02 ENCOUNTER — Ambulatory Visit: Attending: Internal Medicine

## 2024-06-02 VITALS — BP 136/66 | HR 53 | Temp 98.4°F | Ht 65.0 in | Wt 144.2 lb

## 2024-06-02 DIAGNOSIS — E785 Hyperlipidemia, unspecified: Secondary | ICD-10-CM | POA: Diagnosis not present

## 2024-06-02 DIAGNOSIS — E781 Pure hyperglyceridemia: Secondary | ICD-10-CM | POA: Diagnosis not present

## 2024-06-02 DIAGNOSIS — E1122 Type 2 diabetes mellitus with diabetic chronic kidney disease: Secondary | ICD-10-CM | POA: Diagnosis not present

## 2024-06-02 DIAGNOSIS — D1800 Hemangioma unspecified site: Secondary | ICD-10-CM | POA: Insufficient documentation

## 2024-06-02 DIAGNOSIS — R001 Bradycardia, unspecified: Secondary | ICD-10-CM

## 2024-06-02 DIAGNOSIS — N1832 Chronic kidney disease, stage 3b: Secondary | ICD-10-CM

## 2024-06-02 DIAGNOSIS — E039 Hypothyroidism, unspecified: Secondary | ICD-10-CM

## 2024-06-02 DIAGNOSIS — L814 Other melanin hyperpigmentation: Secondary | ICD-10-CM | POA: Insufficient documentation

## 2024-06-02 DIAGNOSIS — I10 Essential (primary) hypertension: Secondary | ICD-10-CM | POA: Diagnosis not present

## 2024-06-02 DIAGNOSIS — D225 Melanocytic nevi of trunk: Secondary | ICD-10-CM | POA: Insufficient documentation

## 2024-06-02 DIAGNOSIS — R202 Paresthesia of skin: Secondary | ICD-10-CM | POA: Insufficient documentation

## 2024-06-02 DIAGNOSIS — J4520 Mild intermittent asthma, uncomplicated: Secondary | ICD-10-CM | POA: Diagnosis not present

## 2024-06-02 DIAGNOSIS — L821 Other seborrheic keratosis: Secondary | ICD-10-CM | POA: Insufficient documentation

## 2024-06-02 LAB — HEPATIC FUNCTION PANEL
ALT: 21 U/L (ref 0–35)
AST: 22 U/L (ref 0–37)
Albumin: 4.3 g/dL (ref 3.5–5.2)
Alkaline Phosphatase: 84 U/L (ref 39–117)
Bilirubin, Direct: 0.1 mg/dL (ref 0.0–0.3)
Total Bilirubin: 0.5 mg/dL (ref 0.2–1.2)
Total Protein: 7 g/dL (ref 6.0–8.3)

## 2024-06-02 LAB — LIPID PANEL
Cholesterol: 161 mg/dL (ref 0–200)
HDL: 50 mg/dL (ref >39.00)
LDL Cholesterol: 79 mg/dL (ref 0–99)
NonHDL: 110.61
Total CHOL/HDL Ratio: 3
Triglycerides: 156 mg/dL — ABNORMAL HIGH (ref 0.0–149.0)
VLDL: 31.2 mg/dL (ref 0.0–40.0)

## 2024-06-02 LAB — URINALYSIS, ROUTINE W REFLEX MICROSCOPIC
Bilirubin Urine: NEGATIVE
Hgb urine dipstick: NEGATIVE
Ketones, ur: NEGATIVE
Nitrite: NEGATIVE
RBC / HPF: NONE SEEN (ref 0–?)
Specific Gravity, Urine: 1.02 (ref 1.000–1.030)
Total Protein, Urine: NEGATIVE
Urine Glucose: NEGATIVE
Urobilinogen, UA: 0.2 (ref 0.0–1.0)
pH: 6 (ref 5.0–8.0)

## 2024-06-02 LAB — BASIC METABOLIC PANEL WITH GFR
BUN: 27 mg/dL — ABNORMAL HIGH (ref 6–23)
CO2: 29 meq/L (ref 19–32)
Calcium: 9.8 mg/dL (ref 8.4–10.5)
Chloride: 103 meq/L (ref 96–112)
Creatinine, Ser: 0.93 mg/dL (ref 0.40–1.20)
GFR: 58.03 mL/min — ABNORMAL LOW (ref >60.00)
Glucose, Bld: 98 mg/dL (ref 70–99)
Potassium: 4 meq/L (ref 3.5–5.1)
Sodium: 139 meq/L (ref 135–145)

## 2024-06-02 LAB — CBC WITH DIFFERENTIAL/PLATELET
Basophils Absolute: 0.1 10*3/uL (ref 0.0–0.1)
Basophils Relative: 1.1 % (ref 0.0–3.0)
Eosinophils Absolute: 0.2 10*3/uL (ref 0.0–0.7)
Eosinophils Relative: 3.8 % (ref 0.0–5.0)
HCT: 42.5 % (ref 36.0–46.0)
Hemoglobin: 14.2 g/dL (ref 12.0–15.0)
Lymphocytes Relative: 15 % (ref 12.0–46.0)
Lymphs Abs: 1 10*3/uL (ref 0.7–4.0)
MCHC: 33.3 g/dL (ref 30.0–36.0)
MCV: 86.3 fl (ref 78.0–100.0)
Monocytes Absolute: 0.5 10*3/uL (ref 0.1–1.0)
Monocytes Relative: 8.1 % (ref 3.0–12.0)
Neutro Abs: 4.6 10*3/uL (ref 1.4–7.7)
Neutrophils Relative %: 72 % (ref 43.0–77.0)
Platelets: 320 10*3/uL (ref 150.0–400.0)
RBC: 4.93 Mil/uL (ref 3.87–5.11)
RDW: 13.9 % (ref 11.5–15.5)
WBC: 6.4 10*3/uL (ref 4.0–10.5)

## 2024-06-02 LAB — MICROALBUMIN / CREATININE URINE RATIO
Creatinine,U: 110.2 mg/dL
Microalb Creat Ratio: 9.4 mg/g (ref 0.0–30.0)
Microalb, Ur: 1 mg/dL (ref 0.0–1.9)

## 2024-06-02 LAB — TSH: TSH: 0.98 u[IU]/mL (ref 0.35–5.50)

## 2024-06-02 LAB — HEMOGLOBIN A1C: Hgb A1c MFr Bld: 6.2 % (ref 4.6–6.5)

## 2024-06-02 MED ORDER — AIRSUPRA 90-80 MCG/ACT IN AERO
2.0000 | INHALATION_SPRAY | Freq: Four times a day (QID) | RESPIRATORY_TRACT | 1 refills | Status: AC | PRN
Start: 2024-06-02 — End: ?

## 2024-06-02 NOTE — Patient Instructions (Signed)
 Bradycardia, Adult Bradycardia is a slower-than-normal heartbeat. A normal resting heart rate for an adult ranges from 60 to 100 beats per minute. With bradycardia, the resting heart rate is less than 60 beats per minute. Bradycardia can prevent enough oxygen from reaching certain areas of your body when you are active. It can be serious if it keeps enough oxygen from reaching your brain and other parts of your body. Bradycardia is not a problem for everyone. For some healthy adults, a slow resting heart rate is normal. What are the causes? This condition may be caused by: A problem with the heart, including: A problem with the heart's electrical system, such as a heart block. With a heart block, electrical signals between the chambers of the heart are partially or completely blocked, so they are not able to work as they should. A problem with the heart's natural pacemaker (sinus node). Heart disease. A heart attack. Heart damage. Lyme disease. A heart infection. A heart condition that is present at birth (congenital heart defect). Certain medicines that treat heart conditions. Certain conditions, such as hypothyroidism and obstructive sleep apnea. Problems with the balance of chemicals and other substances, like potassium, in the blood. Trauma. Radiation therapy. What increases the risk? You are more likely to develop this condition if you: Are age 51 or older. Have high blood pressure (hypertension), high cholesterol (hyperlipidemia), or diabetes. Drink heavily, use tobacco or nicotine products, or use drugs. What are the signs or symptoms? Symptoms of this condition include: Light-headedness. Feeling faint or fainting. Fatigue and weakness. Trouble with activity or exercise. Shortness of breath. Chest pain (angina). Drowsiness. Confusion. Dizziness. How is this diagnosed? This condition may be diagnosed based on: Your symptoms. Your medical history. A physical exam. During  the exam, your health care provider will listen to your heartbeat and check your pulse. To confirm the diagnosis, your health care provider may order tests, such as: Blood tests. An electrocardiogram (ECG). This test records the heart's electrical activity. The test can show how fast your heart is beating and whether the heartbeat is steady. A test in which you wear a portable device (event recorder or Holter monitor) to record your heart's electrical activity while you go about your day. An exercise test. How is this treated? Treatment for this condition depends on the cause of the condition and how severe your symptoms are. Treatment may involve: Treatment of the underlying condition. Changing your medicines or how much medicine you take. Having a small, battery-operated device called a pacemaker implanted under the skin. When bradycardia occurs, this device can be used to increase your heart rate and help your heart beat in a regular rhythm. Follow these instructions at home: Lifestyle Manage any health conditions that contribute to bradycardia as told by your health care provider. Follow a heart-healthy diet. A nutrition specialist (dietitian) can help educate you about healthy food options and changes. Follow an exercise program that is approved by your health care provider. Maintain a healthy weight. Try to reduce or manage your stress, such as with yoga or meditation. If you need help reducing stress, ask your health care provider. Do not use any products that contain nicotine or tobacco. These products include cigarettes, chewing tobacco, and vaping devices, such as e-cigarettes. If you need help quitting, ask your health care provider. Do not use illegal drugs. Alcohol use If you drink alcohol: Limit how much you have to: 0-1 drink a day for women who are not pregnant. 0-2 drinks a day  for men. Know how much alcohol is in a drink. In the [place] one drink equals one 12 oz bottle of  beer (355 mL), one 5 oz glass of wine (148 mL), or one 1 oz glass of hard liquor (44 mL). General instructions Take over-the-counter and prescription medicines only as told by your health care provider. Keep all follow-up visits. This is important. How is this prevented? In some cases, bradycardia may be prevented by: Treating underlying medical problems. Stopping behaviors or medicines that can trigger the condition. Contact a health care provider if: You feel light-headed or dizzy. You almost faint. You feel weak or are easily fatigued during physical activity. You experience confusion or have memory problems. Get help right away if: You faint. You have chest pains or an irregular heartbeat (palpitations). You have trouble breathing. These symptoms may represent a serious problem that is an emergency. Do not wait to see if the symptoms will go away. Get medical help right away. Call your local emergency services (911 in the [place] Do not drive yourself to the hospital. Summary Bradycardia is a slower-than-normal heartbeat. With bradycardia, the resting heart rate is less than 60 beats per minute. Treatment for this condition depends on the cause. Manage any health conditions that contribute to bradycardia as told by your health care provider. Do not use any products that contain nicotine or tobacco. These products include cigarettes, chewing tobacco, and vaping devices, such as e-cigarettes. Keep all follow-up visits. This is important. This information is not intended to replace advice given to you by your health care provider. Make sure you discuss any questions you have with your health care provider. Document Revised: 04/08/2021 Document Reviewed: 04/08/2021 Elsevier Patient Education  2024 [employer].

## 2024-06-02 NOTE — Progress Notes (Signed)
 Subjective:  Patient ID: Natalie Shannon, female    DOB: [birth year]  Age: 83 y.o. MRN: [id number]  CC: Hypertension, Hypothyroidism, Hyperlipidemia, and Diabetes   HPI Natalie Shannon presents for f/up ---  Discussed the use of AI scribe software for clinical note transcription with the patient, who gave verbal consent to proceed.  History of Present Illness   Natalie Shannon is an 83 year old female who presents with dizziness and a low heart rate.  She experiences dizziness/lightheadedness, particularly when the wind is blowing and pollen is present, which she attributes to her allergies. She takes an over-the-counter antihistamine for her allergies, ensuring it is safe for her due to her kidney condition. No chest pain, shortness of breath, or symptoms related to her thyroid  medication being too high or too low. She mentions feeling lightheaded today, which she associates with her allergies and the windy season.   She is not currently taking any pain medication, although she did last week for an unspecified issue that has since resolved.  She feels a lot better than she was a week ago after having a lesion removed, which was initially suspected to be cancerous but was not. She describes the lesion as having shrunk significantly and healed quickly.       Outpatient Medications Prior to Visit  Medication Sig Dispense Refill   atorvastatin  (LIPITOR) 10 MG tablet TAKE ONE TABLET BY MOUTH DAILY 90 tablet 1   augmented betamethasone dipropionate (DIPROLENE-AF) 0.05 % cream Apply topically daily.     budesonide  (PULMICORT  FLEXHALER) 180 MCG/ACT inhaler Inhale 2 puffs into the lungs 2 (two) times daily. 6 each 1   cycloSPORINE (RESTASIS) 0.05 % ophthalmic emulsion INSTILL ONE DROP IN EACH EYE TWICE DAILY     desonide (DESOWEN) 0.05 % ointment APPLY TOPICALLY TO THE AFFECTED AREA TWICE DAILY FOR 14 DAYS. 15 g 2   famotidine  (PEPCID ) 40 MG tablet TAKE ONE TABLET BY MOUTH DAILY 90 tablet 1    KERENDIA  20 MG TABS TAKE 1 TABLET EVERY DAY 90 tablet 3   levothyroxine  (SYNTHROID ) 75 MCG tablet Take 1 tablet (75 mcg total) by mouth daily. 90 tablet 1   losartan  (COZAAR ) 100 MG tablet TAKE ONE TABLET BY MOUTH DAILY 90 tablet 1   metroNIDAZOLE  (METROCREAM ) 0.75 % cream Apply topically 2 (two) times daily. 45 g 2   montelukast  (SINGULAIR ) 10 MG tablet TAKE ONE TABLET BY MOUTH AT BEDTIME 90 tablet 1   Albuterol -Budesonide  (AIRSUPRA ) 90-80 MCG/ACT AERO Inhale 2 puffs into the lungs 4 (four) times daily as needed. 32.1 g 1   No facility-administered medications prior to visit.    ROS Review of Systems  Constitutional:  Negative for appetite change, chills, diaphoresis, fatigue and fever.  HENT: Negative.    Eyes: Negative.   Respiratory:  Negative for cough, chest tightness, shortness of breath and wheezing.   Cardiovascular:  Negative for chest pain, palpitations and leg swelling.  Gastrointestinal:  Negative for abdominal pain, blood in stool, diarrhea, nausea and vomiting.  Endocrine: Negative.  Negative for cold intolerance and heat intolerance.  Genitourinary: Negative.  Negative for difficulty urinating and enuresis.  Musculoskeletal: Negative.  Negative for arthralgias, back pain and gait problem.  Skin: Negative.  Negative for color change and pallor.  Neurological:  Positive for dizziness and light-headedness. Negative for weakness.  Hematological:  Negative for adenopathy. Does not bruise/bleed easily.  Psychiatric/Behavioral: Negative.      Objective:  BP 136/66 (BP Location: Left Arm, Patient  Position: Sitting, Cuff Size: Normal)   Pulse (!) 53   Temp 98.4 F (36.9 C) (Temporal)   Ht 5\' 5"  (1.651 m)   Wt 144 lb 3.2 oz (65.4 kg)   SpO2 96%   BMI 24.00 kg/m   BP Readings from Last 3 Encounters:  06/02/24 136/66  12/18/23 132/70  06/19/23 122/68    Wt Readings from Last 3 Encounters:  06/02/24 144 lb 3.2 oz (65.4 kg)  02/03/24 146 lb (66.2 kg)  12/18/23 146  lb 3.2 oz (66.3 kg)    Physical Exam Vitals reviewed.  Constitutional:      Appearance: Normal appearance.  HENT:     Nose: Nose normal.     Mouth/Throat:     Mouth: Mucous membranes are moist.  Eyes:     General: No scleral icterus.    Conjunctiva/sclera: Conjunctivae normal.  Cardiovascular:     Rate and Rhythm: Regular rhythm. Bradycardia present.     Heart sounds: No murmur heard.    No friction rub. No gallop.     Comments: EKG--- SB, 53 bpm No LVH, Q waves, or ST/T wave changes  Unchanged  Pulmonary:     Breath sounds: No stridor. No wheezing, rhonchi or rales.  Abdominal:     General: Abdomen is flat.     Palpations: There is no mass.     Tenderness: There is no abdominal tenderness. There is no guarding.     Hernia: No hernia is present.  Musculoskeletal:        General: Normal range of motion.     Cervical back: Neck supple.     Right lower leg: No edema.     Left lower leg: No edema.  Lymphadenopathy:     Cervical: No cervical adenopathy.  Skin:    General: Skin is warm and dry.  Neurological:     General: No focal deficit present.     Mental Status: She is alert.  Psychiatric:        Mood and Affect: Mood normal.        Behavior: Behavior normal.     Lab Results  Component Value Date   WBC 6.4 06/02/2024   HGB 14.2 06/02/2024   HCT 42.5 06/02/2024   PLT 320.0 06/02/2024   GLUCOSE 98 06/02/2024   CHOL 161 06/02/2024   TRIG 156.0 (H) 06/02/2024   HDL 50.00 06/02/2024   LDLDIRECT 88.0 06/19/2023   LDLCALC 79 06/02/2024   ALT 21 06/02/2024   AST 22 06/02/2024   NA 139 06/02/2024   K 4.0 06/02/2024   CL 103 06/02/2024   CREATININE 0.93 06/02/2024   BUN 27 (H) 06/02/2024   CO2 29 06/02/2024   TSH 0.98 06/02/2024   INR 1.01 05/15/2012   HGBA1C 6.2 06/02/2024   MICROALBUR 1.0 06/02/2024    No results found.  Assessment & Plan:   Acquired hypothyroidism- She is euthyroid. -     TSH; Future -     CBC with Differential/Platelet;  Future  Hyperlipidemia LDL goal <130- LDL goal achieved. Doing well on the statin  -     Lipid panel; Future -     Hepatic function panel; Future  Essential hypertension- BP is well controlled. -     Hepatic function panel; Future -     CBC with Differential/Platelet; Future -     Basic metabolic panel with GFR; Future  Asthma, mild intermittent, well-controlled -     CBC with Differential/Platelet; Future -  Airsupra ; Inhale 2 puffs into the lungs 4 (four) times daily as needed.  Dispense: 32.1 g; Refill: 1  Pure hyperglyceridemia -     Lipid panel; Future  Stage 3b chronic kidney disease- Will avoid nephrotoxic agents  -     Microalbumin / creatinine urine ratio; Future -     Urinalysis, Routine w reflex microscopic; Future -     Basic metabolic panel with GFR; Future  Type 2 diabetes mellitus with stage 3b chronic kidney disease, without long-term current use of insulin (HCC)- Blood sugar is well controlled. -     Microalbumin / creatinine urine ratio; Future -     Urinalysis, Routine w reflex microscopic; Future -     Hemoglobin A1c; Future  Bradycardia -     EKG 12-Lead -     LONG TERM MONITOR (3-14 DAYS); Future     Follow-up: Return in about 6 months (around 12/02/2024).  Sandra Crouch, MD

## 2024-06-02 NOTE — Progress Notes (Unsigned)
 EP to read.

## 2024-06-03 ENCOUNTER — Telehealth: Payer: Self-pay

## 2024-06-03 ENCOUNTER — Other Ambulatory Visit (HOSPITAL_COMMUNITY): Payer: Self-pay

## 2024-06-03 NOTE — Telephone Encounter (Signed)
 Pharmacy Patient Advocate Encounter   Received notification from CoverMyMeds that prior authorization for Airsupra  is required/requested.   Insurance verification completed.   The patient is insured through [employer] .   Per test claim: [id number]

## 2024-06-03 NOTE — Telephone Encounter (Signed)
 Pharmacy Patient Advocate Encounter  Received notification from [employer] that Prior Authorization for Airsupra  has been APPROVED from 06/03/24 to 12/29/24. Ran test claim, Copay is $100.00. This test claim was processed through [facility]- copay amounts may vary at other pharmacies due to pharmacy/plan contracts, or as the patient moves through the different stages of their insurance plan.   PA #/Case ID/Reference #: [id number]

## 2024-06-04 ENCOUNTER — Ambulatory Visit: Payer: Self-pay | Admitting: Internal Medicine

## 2024-06-04 NOTE — Telephone Encounter (Signed)
 Patient has been made aware that the inhaler has been approved. She states she doesn't want it. I gave her a verbal understanding

## 2024-06-09 ENCOUNTER — Telehealth: Payer: Self-pay | Admitting: Internal Medicine

## 2024-06-09 NOTE — Telephone Encounter (Signed)
 Copied from CRM [id number]. Topic: Clinical - Medical Advice >> Jun 09, 2024 11:42 AM Allyne Areola wrote: Reason for CRM: Patient is calling regarding a long term monitor she is wearing, She is concerned because her cell phone is about 80 years old and she would like to make sure it's doing the job of recording the results.

## 2024-06-10 NOTE — Telephone Encounter (Signed)
 I reached out to the patient she states that everything is working fine she's just concerned about her phone being a little older. I assured her that long as everything is working fine she should be okay and if she had any issues to give the office a call.

## 2024-06-23 DIAGNOSIS — R001 Bradycardia, unspecified: Secondary | ICD-10-CM

## 2024-06-29 ENCOUNTER — Ambulatory Visit: Admitting: Internal Medicine

## 2024-06-29 ENCOUNTER — Encounter: Payer: Self-pay | Admitting: Internal Medicine

## 2024-06-29 VITALS — BP 146/72 | HR 68 | Temp 97.7°F | Resp 16 | Ht 65.0 in | Wt 146.5 lb

## 2024-06-29 DIAGNOSIS — Z23 Encounter for immunization: Secondary | ICD-10-CM

## 2024-06-29 DIAGNOSIS — R001 Bradycardia, unspecified: Secondary | ICD-10-CM | POA: Diagnosis not present

## 2024-06-29 DIAGNOSIS — I10 Essential (primary) hypertension: Secondary | ICD-10-CM | POA: Diagnosis not present

## 2024-06-29 DIAGNOSIS — Z0001 Encounter for general adult medical examination with abnormal findings: Secondary | ICD-10-CM

## 2024-06-29 MED ORDER — BOOSTRIX 5-2.5-18.5 LF-MCG/0.5 IM SUSP: 0.5000 mL | Freq: Once | INTRAMUSCULAR | 0 refills | Status: AC

## 2024-06-29 NOTE — Progress Notes (Signed)
 Subjective:  Patient ID: Natalie Shannon, female    DOB: 1941-09-24  Age: 83 y.o. MRN: [id number]  CC: Annual Exam, Hypertension, Hypothyroidism, and Asthma   HPI Natalie Shannon presents for a CPX and f/up -----  Discussed the use of AI scribe software for clinical note transcription with the patient, who gave verbal consent to proceed.  History of Present Illness   Natalie Shannon is an 83 year old female who presents with dizziness and a low heart rate.  She has been experiencing dizziness for the past four weeks, which is not severe enough to cause presyncope or syncope. She has not taken any medication for it yet, as she wants to understand the cause of her symptoms first.  Her heart rate has been recorded as low, reportedly dropping into the forties, although she has not seen this report herself. She frequently checks her pulse at home, noting it has fallen below sixty on three occasions, with the lowest readings being fifty-seven, fifty-six, and fifty-nine. She checks her pulse approximately one hundred times a day.  No swelling in her legs or feet, numbness, weakness, or tingling. She denies recent coughing but reports sneezing due to allergies.  She considers her blood pressure to be 'pretty good.'       Outpatient Medications Prior to Visit  Medication Sig Dispense Refill   Albuterol -Budesonide  (AIRSUPRA ) 90-80 MCG/ACT AERO Inhale 2 puffs into the lungs 4 (four) times daily as needed. 32.1 g 1   atorvastatin  (LIPITOR) 10 MG tablet TAKE ONE TABLET BY MOUTH DAILY 90 tablet 1   augmented betamethasone dipropionate (DIPROLENE-AF) 0.05 % cream Apply topically daily.     budesonide  (PULMICORT  FLEXHALER) 180 MCG/ACT inhaler Inhale 2 puffs into the lungs 2 (two) times daily. 6 each 1   cycloSPORINE (RESTASIS) 0.05 % ophthalmic emulsion INSTILL ONE DROP IN EACH EYE TWICE DAILY     desonide (DESOWEN) 0.05 % ointment APPLY TOPICALLY TO THE AFFECTED AREA TWICE DAILY FOR 14 DAYS. 15  g 2   famotidine  (PEPCID ) 40 MG tablet TAKE ONE TABLET BY MOUTH DAILY 90 tablet 1   KERENDIA  20 MG TABS TAKE 1 TABLET EVERY DAY 90 tablet 3   levothyroxine  (SYNTHROID ) 75 MCG tablet Take 1 tablet (75 mcg total) by mouth daily. 90 tablet 1   losartan  (COZAAR ) 100 MG tablet TAKE ONE TABLET BY MOUTH DAILY 90 tablet 1   metroNIDAZOLE  (METROCREAM ) 0.75 % cream Apply topically 2 (two) times daily. 45 g 2   montelukast  (SINGULAIR ) 10 MG tablet TAKE ONE TABLET BY MOUTH AT BEDTIME 90 tablet 1   No facility-administered medications prior to visit.    ROS Review of Systems  Constitutional:  Negative for appetite change, chills, diaphoresis, fatigue and fever.  Eyes: Negative.   Respiratory: Negative.  Negative for cough, chest tightness and shortness of breath.   Cardiovascular:  Negative for chest pain, palpitations and leg swelling.  Gastrointestinal: Negative.  Negative for abdominal pain, constipation, diarrhea and nausea.  Endocrine: Negative.   Genitourinary: Negative.  Negative for difficulty urinating.  Musculoskeletal: Negative.  Negative for arthralgias and myalgias.  Neurological:  Positive for dizziness and light-headedness. Negative for syncope, weakness and numbness.  Hematological:  Negative for adenopathy. Does not bruise/bleed easily.  Psychiatric/Behavioral: Negative.      Objective:  BP (!) 146/72 (BP Location: Left Arm, Patient Position: Sitting, Cuff Size: Normal)   Pulse 68   Temp 97.7 F (36.5 C) (Oral)   Resp 16   Ht 5'  5 (1.651 m)   Wt 146 lb 8 oz (66.5 kg)   SpO2 96%   BMI 24.38 kg/m   BP Readings from Last 3 Encounters:  06/29/24 (!) 146/72  06/02/24 136/66  12/18/23 132/70    Wt Readings from Last 3 Encounters:  06/29/24 146 lb 8 oz (66.5 kg)  06/02/24 144 lb 3.2 oz (65.4 kg)  02/03/24 146 lb (66.2 kg)    Physical Exam Vitals reviewed.  Constitutional:      Appearance: Normal appearance.  HENT:     Mouth/Throat:     Mouth: Mucous membranes are  moist.   Eyes:     General: No scleral icterus.    Conjunctiva/sclera: Conjunctivae normal.    Cardiovascular:     Rate and Rhythm: Normal rate and regular rhythm.     Heart sounds: No murmur heard.    No friction rub. No gallop.  Pulmonary:     Effort: Pulmonary effort is normal.     Breath sounds: No stridor. No wheezing, rhonchi or rales.  Abdominal:     General: Abdomen is flat.     Palpations: There is no mass.     Tenderness: There is no abdominal tenderness. There is no guarding.     Hernia: No hernia is present.   Musculoskeletal:        General: Normal range of motion.     Cervical back: Neck supple.     Right lower leg: No edema.     Left lower leg: No edema.  Lymphadenopathy:     Cervical: No cervical adenopathy.   Skin:    General: Skin is warm and dry.   Neurological:     General: No focal deficit present.     Mental Status: She is alert.   Psychiatric:        Mood and Affect: Mood normal.        Behavior: Behavior normal.     Lab Results  Component Value Date   WBC 6.4 06/02/2024   HGB 14.2 06/02/2024   HCT 42.5 06/02/2024   PLT 320.0 06/02/2024   GLUCOSE 98 06/02/2024   CHOL 161 06/02/2024   TRIG 156.0 (H) 06/02/2024   HDL 50.00 06/02/2024   LDLDIRECT 88.0 06/19/2023   LDLCALC 79 06/02/2024   ALT 21 06/02/2024   AST 22 06/02/2024   NA 139 06/02/2024   K 4.0 06/02/2024   CL 103 06/02/2024   CREATININE 0.93 06/02/2024   BUN 27 (H) 06/02/2024   CO2 29 06/02/2024   TSH 0.98 06/02/2024   INR 1.01 05/15/2012   HGBA1C 6.2 06/02/2024   MICROALBUR 1.0 06/02/2024    No results found.  Assessment & Plan:  Symptomatic sinus bradycardia -     Ambulatory referral to Cardiology  Encounter for general adult medical examination with abnormal findings- Exam completed, labs reviewed, vaccines reviewed and updated, no cancer screenings, pt ed material was given.   Essential hypertension- Her BP is adequately well controlled.     Follow-up:  Return in about 6 months (around 12/30/2024).  Debby Molt, MD

## 2024-06-29 NOTE — Patient Instructions (Signed)
 Bradycardia, Adult Bradycardia is a slower-than-normal heartbeat. A normal resting heart rate for an adult ranges from 60 to 100 beats per minute. With bradycardia, the resting heart rate is less than 60 beats per minute. Bradycardia can prevent enough oxygen from reaching certain areas of your body when you are active. It can be serious if it keeps enough oxygen from reaching your brain and other parts of your body. Bradycardia is not a problem for everyone. For some healthy adults, a slow resting heart rate is normal. What are the causes? This condition may be caused by: A problem with the heart, including: A problem with the heart's electrical system, such as a heart block. With a heart block, electrical signals between the chambers of the heart are partially or completely blocked, so they are not able to work as they should. A problem with the heart's natural pacemaker (sinus node). Heart disease. A heart attack. Heart damage. Lyme disease. A heart infection. A heart condition that is present at birth (congenital heart defect). Certain medicines that treat heart conditions. Certain conditions, such as hypothyroidism and obstructive sleep apnea. Problems with the balance of chemicals and other substances, like potassium, in the blood. Trauma. Radiation therapy. What increases the risk? You are more likely to develop this condition if you: Are age 51 or older. Have high blood pressure (hypertension), high cholesterol (hyperlipidemia), or diabetes. Drink heavily, use tobacco or nicotine products, or use drugs. What are the signs or symptoms? Symptoms of this condition include: Light-headedness. Feeling faint or fainting. Fatigue and weakness. Trouble with activity or exercise. Shortness of breath. Chest pain (angina). Drowsiness. Confusion. Dizziness. How is this diagnosed? This condition may be diagnosed based on: Your symptoms. Your medical history. A physical exam. During  the exam, your health care provider will listen to your heartbeat and check your pulse. To confirm the diagnosis, your health care provider may order tests, such as: Blood tests. An electrocardiogram (ECG). This test records the heart's electrical activity. The test can show how fast your heart is beating and whether the heartbeat is steady. A test in which you wear a portable device (event recorder or Holter monitor) to record your heart's electrical activity while you go about your day. An exercise test. How is this treated? Treatment for this condition depends on the cause of the condition and how severe your symptoms are. Treatment may involve: Treatment of the underlying condition. Changing your medicines or how much medicine you take. Having a small, battery-operated device called a pacemaker implanted under the skin. When bradycardia occurs, this device can be used to increase your heart rate and help your heart beat in a regular rhythm. Follow these instructions at home: Lifestyle Manage any health conditions that contribute to bradycardia as told by your health care provider. Follow a heart-healthy diet. A nutrition specialist (dietitian) can help educate you about healthy food options and changes. Follow an exercise program that is approved by your health care provider. Maintain a healthy weight. Try to reduce or manage your stress, such as with yoga or meditation. If you need help reducing stress, ask your health care provider. Do not use any products that contain nicotine or tobacco. These products include cigarettes, chewing tobacco, and vaping devices, such as e-cigarettes. If you need help quitting, ask your health care provider. Do not use illegal drugs. Alcohol use If you drink alcohol: Limit how much you have to: 0-1 drink a day for women who are not pregnant. 0-2 drinks a day  for men. Know how much alcohol is in a drink. In the [place] one drink equals one 12 oz bottle of  beer (355 mL), one 5 oz glass of wine (148 mL), or one 1 oz glass of hard liquor (44 mL). General instructions Take over-the-counter and prescription medicines only as told by your health care provider. Keep all follow-up visits. This is important. How is this prevented? In some cases, bradycardia may be prevented by: Treating underlying medical problems. Stopping behaviors or medicines that can trigger the condition. Contact a health care provider if: You feel light-headed or dizzy. You almost faint. You feel weak or are easily fatigued during physical activity. You experience confusion or have memory problems. Get help right away if: You faint. You have chest pains or an irregular heartbeat (palpitations). You have trouble breathing. These symptoms may represent a serious problem that is an emergency. Do not wait to see if the symptoms will go away. Get medical help right away. Call your local emergency services (911 in the [place] Do not drive yourself to the hospital. Summary Bradycardia is a slower-than-normal heartbeat. With bradycardia, the resting heart rate is less than 60 beats per minute. Treatment for this condition depends on the cause. Manage any health conditions that contribute to bradycardia as told by your health care provider. Do not use any products that contain nicotine or tobacco. These products include cigarettes, chewing tobacco, and vaping devices, such as e-cigarettes. Keep all follow-up visits. This is important. This information is not intended to replace advice given to you by your health care provider. Make sure you discuss any questions you have with your health care provider. Document Revised: 04/08/2021 Document Reviewed: 04/08/2021 Elsevier Patient Education  2024 [employer].

## 2024-06-30 ENCOUNTER — Other Ambulatory Visit: Payer: Self-pay | Admitting: Internal Medicine

## 2024-06-30 DIAGNOSIS — E039 Hypothyroidism, unspecified: Secondary | ICD-10-CM

## 2024-07-01 ENCOUNTER — Telehealth: Payer: Self-pay | Admitting: Dietician

## 2024-07-01 NOTE — Telephone Encounter (Signed)
 Patient states that she is having a heart issue and thinks that she will need to see a dietitian again.  I do see patients with heart issues as well and told her that I can continue to see her.  A follow up appointment has been made.

## 2024-07-06 NOTE — Progress Notes (Signed)
 "      OFFICE NOTE:    Date:  07/07/2024  ID:  Natalie Shannon, DOB 1941-03-23, MRN [id number] PCP: Joshua Debby CROME, MD  [facility] Providers Cardiologist:  None        Hypertension  Hyperlipidemia  Diabetes mellitus  Chronic kidney disease  Hypothyroidism  Asthma        Discussed the use of AI scribe software for clinical note transcription with the patient, who gave verbal consent to proceed. History of Present Illness Natalie Shannon is a 83 y.o. female who is referred by Dr. Joshua for evaluation of bradycardia. Notes from 06/02/24 and 06/29/24 reviewed. She has had symptoms of dizziness but no syncope. She has recorded HRs in the 40s at times. EKG from 06/02/24 showed sinus brady, HR 53, no ST TW changes, no evidence of preexcitation. Cardiac monitor was arranged and demonstrated average HR 69, 53 SVT episodes (longest 13 seconds at 106 bpm; fastest 5 beats at 169 bpm).  No atrial fibrillation or high-grade heart block noted.  Symptoms corresponded with sinus rhythm.  She frequently monitors her blood pressure and notes her pulse is mostly in the 60s and 70s, with occasional readings in the 50s. No symptoms were experienced during these episodes. Her blood pressure at home ranges from 110 to 130 mmHg, and she denies any significant symptoms associated with her blood pressure readings.  She experiences lightheadedness in the summer, attributing it to heat, a condition present since childhood. She carries Dramamine for this. She remains active, pedaling while seated for an hour and a half daily, and engages in activities like grocery shopping and walking her dog. No recent changes in activity tolerance or symptoms like chest pain, shortness of breath, or leg swelling.  She reports asthma-related tightness and shortness of breath, which she has experienced for years. She does not use her inhaler regularly but states her asthma is well controlled. No breathing difficulties when lying  flat or waking up suddenly out of breath.  Her family history includes heart disease in her mother, though details are unclear.  She is divorced. She does not smoke and has a history of smoking in high school. She denies alcohol or drug use.  Review of Systems  Cardiovascular:  Negative for claudication and syncope.  -See HPI    Studies Reviewed:      LONG TERM MONITOR (3-14 DAYS) 06/23/2024 Narrative Patch Wear Time:  13 days and 22 hours  HR 42 - 169, average 69 bpm. 53 SVT episodes, fastest 5 beats at 169 BPM, longest 13 seconds at 106 BPM No atrial fibrillation detected. Rare supraventricular ectopy. Rare ventricular ectopy. No sustained arrhythmias. Symptom trigger episodes correspond to sinus rhythm  [facility] Cardiac Electrophysiology   ELECTROCARDIOGRAM   Results Lab Results  Component Value Date/Time   K 4.0 06/02/2024 10:30 AM   BUN 27 (H) 06/02/2024 10:30 AM   CREATININE 0.93 06/02/2024 10:30 AM   CREATININE 1.16 (H) 09/13/2020 02:36 PM   GFR 58.03 (L) 06/02/2024 10:30 AM   HGB 14.2 06/02/2024 10:30 AM   TSH 0.98 06/02/2024 10:30 AM   ALT 21 06/02/2024 10:30 AM   Lab Results  Component Value Date/Time   CHOL 161 06/02/2024 10:30 AM   TRIG 156.0 (H) 06/02/2024 10:30 AM   HDL 50.00 06/02/2024 10:30 AM   LDLCALC 79 06/02/2024 10:30 AM    Lab Results  Component Value Date   HGBA1C 6.2 06/02/2024     Risk Assessment/Calculations:  STOP-Bang Score:  4     Physical Exam:  VS:  BP 130/70   Pulse 72   Ht 5' 5 (1.651 m)   Wt 145 lb (65.8 kg)   SpO2 97%   BMI 24.13 kg/m        Wt Readings from Last 3 Encounters:  07/07/24 145 lb (65.8 kg)  06/29/24 146 lb 8 oz (66.5 kg)  06/02/24 144 lb 3.2 oz (65.4 kg)    Constitutional:      Appearance: Healthy appearance. Not in distress.  Neck:     Vascular: No carotid bruit or JVR. JVD normal.  Pulmonary:     Breath sounds: Normal breath sounds. No wheezing. No rales.  Cardiovascular:      Normal rate. Regular rhythm.     Murmurs: There is no murmur.  Edema:    Peripheral edema absent.  Abdominal:     Palpations: Abdomen is soft.  Skin:    General: Skin is warm and dry.       Assessment and Plan:    Assessment & Plan Bradycardia Monitor and EKG done recently reviewed. She has no evidence of conduction system disease. She did not have pauses or evidence of high grade heart block on her monitor. She has not had exercise intolerance to suggest chronotropic incompetence. She has not had syncope or near syncope. She does not meet criteria for pacemaker implantation or further evaluation with EP. No further testing needed unless she has a change in symptoms.  SVT (supraventricular tachycardia) (HCC) Asymptomatic. She had brief episodes of SVT on her monitor. No sustained episodes. She did not report symptoms with these. She has asthma. I would not recommend beta-blocker Rx, even as needed, given her bradycardia at times and hx of asthma. As noted below, will get an echocardiogram to rule out structural heart disease.   - Obtain echocardiogram  Shortness of breath She notes shortness of breath with activities that is most likely c/w her asthma. She is not having chest pain to suggest angina. Her EKG does not have ischemic changes. She does have a hx of snoring and bradycardia as well as daytime hypersomnolence. I have recommended testing for sleep apnea. I have also recommended an echocardiogram to rule out structural heart disease and assess pulmonary pressures.  - Obtain Echocardiogram  - Follow up based upon test results  Snoring STOP-Bang Score:  4   Given hx of bradycardia noted during sleep and hx of snoring, I have recommended testing for sleep apnea. We decided to obtain an [facility] home sleep study. However, when the CMA reviewed the testing process, the patient decided to not proceed. She does not want an in lab sleep study. If her Echocardiogram shows elevated RVSP, would  again recommend proceeding with a sleep study.  Essential hypertension BP elevated initially here. She notes higher BPs in the clinic and optimal BPs at home.         Dispo:  Return for follow up based upon test results.  Signed, Glendia Ferrier, PA-C   "

## 2024-07-07 ENCOUNTER — Ambulatory Visit: Attending: Physician Assistant | Admitting: Physician Assistant

## 2024-07-07 ENCOUNTER — Encounter: Payer: Self-pay | Admitting: Physician Assistant

## 2024-07-07 VITALS — BP 130/70 | HR 72 | Ht 65.0 in | Wt 145.0 lb

## 2024-07-07 DIAGNOSIS — R0683 Snoring: Secondary | ICD-10-CM | POA: Diagnosis not present

## 2024-07-07 DIAGNOSIS — I10 Essential (primary) hypertension: Secondary | ICD-10-CM

## 2024-07-07 DIAGNOSIS — R001 Bradycardia, unspecified: Secondary | ICD-10-CM | POA: Diagnosis not present

## 2024-07-07 DIAGNOSIS — I471 Supraventricular tachycardia, unspecified: Secondary | ICD-10-CM | POA: Diagnosis not present

## 2024-07-07 DIAGNOSIS — R0602 Shortness of breath: Secondary | ICD-10-CM | POA: Diagnosis not present

## 2024-07-07 NOTE — Assessment & Plan Note (Signed)
 BP elevated initially here. She notes higher BPs in the clinic and optimal BPs at home.

## 2024-07-07 NOTE — Patient Instructions (Addendum)
 Medication Instructions:  Your physician recommends that you continue on your current medications as directed. Please refer to the Current Medication list given to you today.  *If you need a refill on your cardiac medications before your next appointment, please call your pharmacy*  Lab Work: None ordered  If you have labs (blood work) drawn today and your tests are completely normal, you will receive your results only by: MyChart Message (if you have MyChart) OR A paper copy in the mail If you have any lab test that is abnormal or we need to change your treatment, we will call you to review the results.  Testing/Procedures: Your physician has requested that you have an echocardiogram. Echocardiography is a painless test that uses sound waves to create images of your heart. It provides your doctor with information about the size and shape of your heart and how well your heart's chambers and valves are working. This procedure takes approximately one hour. There are no restrictions for this procedure. Please do NOT wear cologne, perfume, aftershave, or lotions (deodorant is allowed). Please arrive 15 minutes prior to your appointment time.  Please note: We ask at that you not bring children with you during ultrasound (echo/ vascular) testing. Due to room size and safety concerns, children are not allowed in the ultrasound rooms during exams. Our front office staff cannot provide observation of children in our lobby area while testing is being conducted. An adult accompanying a patient to their appointment will only be allowed in the ultrasound room at the discretion of the ultrasound technician under special circumstances. We apologize for any inconvenience.   Your physician has recommended that you have a sleep study. This test records several body functions during sleep, including: brain activity, eye movement, oxygen and carbon dioxide blood levels, heart rate and rhythm, breathing rate and  rhythm, the flow of air through your mouth and nose, snoring, body muscle movements, and chest and belly movement.   Follow-Up: At [facility], you and your health needs are our priority.  As part of our continuing mission to provide you with exceptional heart care, our providers are all part of one team.  This team includes your primary Cardiologist (physician) and Advanced Practice Providers or APPs (Physician Assistants and Nurse Practitioners) who all work together to provide you with the care you need, when you need it.  Your next appointment:   DEPENDING UPON TEST RESULTS  Provider:   Glendia Ferrier, PA-C          We recommend signing up for the patient portal called MyChart.  Sign up information is provided on this After Visit Summary.  MyChart is used to connect with patients for Virtual Visits (Telemedicine).  Patients are able to view lab/test results, encounter notes, upcoming appointments, etc.  Non-urgent messages can be sent to your provider as well.   To learn more about what you can do with MyChart, go to [email or web address].   Other Instructions     You can get Flonase  over the counter to use for nasal congestion.

## 2024-07-15 DIAGNOSIS — Z6824 Body mass index (BMI) 24.0-24.9, adult: Secondary | ICD-10-CM | POA: Diagnosis not present

## 2024-07-15 DIAGNOSIS — Z01419 Encounter for gynecological examination (general) (routine) without abnormal findings: Secondary | ICD-10-CM | POA: Diagnosis not present

## 2024-07-23 ENCOUNTER — Telehealth: Payer: Self-pay | Admitting: Internal Medicine

## 2024-07-23 NOTE — Telephone Encounter (Signed)
 Call and spoke with patient about her chart and she states that she will schedule an appointment with Dr. Joshua.

## 2024-07-23 NOTE — Telephone Encounter (Signed)
 Copied from CRM [id number]. Topic: Clinical - Medical Advice >> Jul 23, 2024 10:08 AM Charolett L wrote: Reason for CRM: patient would like for the nurse to give her call about her chart and would like the to be called after lunch she's running her errands  Cb#[phone]

## 2024-08-03 ENCOUNTER — Encounter: Payer: Self-pay | Admitting: Dietician

## 2024-08-03 ENCOUNTER — Encounter: Attending: Internal Medicine | Admitting: Dietician

## 2024-08-03 DIAGNOSIS — E1122 Type 2 diabetes mellitus with diabetic chronic kidney disease: Secondary | ICD-10-CM | POA: Insufficient documentation

## 2024-08-03 DIAGNOSIS — N1832 Chronic kidney disease, stage 3b: Secondary | ICD-10-CM | POA: Diagnosis not present

## 2024-08-03 NOTE — Progress Notes (Signed)
 Diabetes Self-Management Education  Visit Type: Follow-up  Appt. Start Time: 1545 Appt. End Time: 1615  08/09/2024  [name], identified by name and date of birth, is a 83 y.o. female with a diagnosis of Diabetes:  .   ASSESSMENT Patient is here today alone.  She was last seen by this RD on 04/26/2024. She states that she is drinking more water as well as some flavored water.   She states that she is concerned as she is having more gas and belching.  Likely has reflux and discussed nutrition for GERD. She has questions about finding low sodium vegetables. MD is afraid of her falling so has stopped walking but peddles for 1-3 hours per day.      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  She is now at her goal weight.  She gained weight after retirement. She is now maintaining her weight at 145 lbs 146 lbs 04/10/2023 145 lbs per patient 04/26/2024   Social History:  Patient lives alone.  Enjoys walking her dog and spends about 3 hours each day outside daily with the dog, walking and playing.  She also uses a peddler.  She does not cook and eats simple foods.  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  She used to have horses. Peddles 1-3 hours daily    Diabetes Self-Management Education - 08/09/24 1314       Visit Information   Visit Type Follow-up      Health Coping   How would you rate your overall health? Good      Psychosocial Assessment   Patient Belief/Attitude about Diabetes Motivated to manage diabetes    What is the hardest part about your diabetes right now, causing you the most concern, or is the most worrisome to you about your diabetes?    Making healty food and beverage choices    Self-care barriers None    Self-management support Doctor's office;CDE visits    Other persons present Patient    Patient Concerns Nutrition/Meal planning;Healthy Lifestyle    Special Needs None    Preferred Learning Style No preference indicated    Learning Readiness Ready      Pre-Education Assessment   Patient understands the diabetes disease and treatment process. Demonstrates understanding / competency    Patient understands incorporating nutritional management into lifestyle. Comprehends key points    Patient undertands incorporating physical activity into lifestyle. Demonstrates understanding / competency    Patient understands using medications safely. Demonstrates understanding / competency    Patient understands monitoring blood glucose, interpreting and using results Demonstrates understanding / competency    Patient understands prevention, detection, and treatment of acute complications. Demonstrates understanding / competency    Patient understands prevention, detection, and treatment of chronic complications. Demonstrates understanding / competency    Patient understands how to develop strategies to address psychosocial issues. Demonstrates understanding / competency    Patient understands how to develop strategies to promote health/change behavior. Comprehends key points      Complications   Last HgB A1C per patient/outside source 6.2 %   06/02/2024     Dietary Intake   Breakfast  cereal with blueberries and milk    Lunch lean cuisine    Dinner meat, vegetables, fruit    Beverage(s) water, diet cranberry juice, diet gingerale      Activity / Exercise   Activity / Exercise Type Light (walking / raking leaves)    How many days per week do you exercise? 7    How many minutes per day do you exercise? 120    Total minutes per week of exercise 840      Patient Education   Previous Diabetes Education Yes   03/2024   Healthy Eating  Meal options for control of blood glucose level and chronic complications.;Plate Method    Being Active Role of exercise on diabetes management, blood pressure control and cardiac health.    Medications Reviewed patients medication for diabetes, action, purpose, timing of dose and side effects.    Diabetes Stress and Support Identified and addressed patients feelings and concerns about diabetes;Worked with patient to identify barriers to care and solutions      Individualized Goals (developed by patient)   Nutrition General guidelines for healthy choices and portions discussed    Physical Activity Exercise 5-7 days per week;60 minutes per day    Medications take my medication as prescribed    Problem Solving Eating Pattern;Addressing barriers to behavior change    Reducing Risk do foot checks daily      Patient Self-Evaluation of Goals - Patient rates self as meeting previously set goals (% of time)   Nutrition >75% (most of the time)    Physical Activity >75% (most of the time)    Medications >75% (most of the time)    Monitoring < 25% (hardly ever/never)    Problem Solving and behavior change strategies  >75% (most of the time)    Reducing Risk (treating acute and chronic complications) >75% (most of the time)    Health Coping >75% (most of the time)      Post-Education Assessment   Patient understands the diabetes disease and treatment process. Demonstrates understanding / competency    Patient understands incorporating nutritional management into lifestyle. Comprehends key points    Patient undertands incorporating physical activity into lifestyle. Demonstrates understanding / competency    Patient understands using medications safely. Demonstrates understanding / competency    Patient understands monitoring blood glucose, interpreting and using results Demonstrates understanding / competency    Patient understands prevention, detection, and treatment of acute complications.  Demonstrates understanding / competency    Patient understands prevention, detection, and treatment of chronic complications. Demonstrates understanding / competency    Patient understands how to develop strategies to address psychosocial issues. Demonstrates understanding / competency    Patient understands how to develop strategies to promote health/change behavior. Comprehends key points      Outcomes   Expected Outcomes Demonstrated interest in learning. Expect positive outcomes    Future DMSE PRN    Program Status Not Completed      Subsequent Visit   Since your last visit have you continued or begun to take your medications as prescribed? Yes    Since your last visit have you experienced any weight changes? No change          Individualized Plan for Diabetes Self-Management Training:   Learning Objective:  Patient will have a greater understanding of diabetes self-management. Patient education plan is to attend individual and/or group sessions per assessed needs and concerns.   Plan:   There are no Patient Instructions on file  for this visit.  Expected Outcomes:  Demonstrated interest in learning. Expect positive outcomes  Education material provided:   If problems or questions, patient to contact team via:  Phone  Future DSME appointment: PRN

## 2024-08-10 ENCOUNTER — Other Ambulatory Visit (HOSPITAL_BASED_OUTPATIENT_CLINIC_OR_DEPARTMENT_OTHER)

## 2024-08-27 ENCOUNTER — Other Ambulatory Visit: Payer: Self-pay | Admitting: Internal Medicine

## 2024-08-27 DIAGNOSIS — J4520 Mild intermittent asthma, uncomplicated: Secondary | ICD-10-CM

## 2024-11-26 ENCOUNTER — Other Ambulatory Visit: Payer: Self-pay | Admitting: Internal Medicine

## 2024-11-26 DIAGNOSIS — I10 Essential (primary) hypertension: Secondary | ICD-10-CM

## 2024-11-26 DIAGNOSIS — K219 Gastro-esophageal reflux disease without esophagitis: Secondary | ICD-10-CM

## 2024-11-26 DIAGNOSIS — E785 Hyperlipidemia, unspecified: Secondary | ICD-10-CM

## 2024-11-26 DIAGNOSIS — J4520 Mild intermittent asthma, uncomplicated: Secondary | ICD-10-CM

## 2024-12-02 ENCOUNTER — Ambulatory Visit: Admitting: Internal Medicine

## 2024-12-02 ENCOUNTER — Encounter: Payer: Self-pay | Admitting: Internal Medicine

## 2024-12-02 VITALS — BP 148/80 | HR 86 | Temp 97.9°F | Ht 65.0 in | Wt 137.0 lb

## 2024-12-02 DIAGNOSIS — E039 Hypothyroidism, unspecified: Secondary | ICD-10-CM

## 2024-12-02 DIAGNOSIS — I10 Essential (primary) hypertension: Secondary | ICD-10-CM

## 2024-12-02 DIAGNOSIS — K219 Gastro-esophageal reflux disease without esophagitis: Secondary | ICD-10-CM

## 2024-12-02 DIAGNOSIS — N1832 Chronic kidney disease, stage 3b: Secondary | ICD-10-CM

## 2024-12-02 DIAGNOSIS — E1122 Type 2 diabetes mellitus with diabetic chronic kidney disease: Secondary | ICD-10-CM

## 2024-12-02 DIAGNOSIS — E785 Hyperlipidemia, unspecified: Secondary | ICD-10-CM

## 2024-12-02 LAB — CBC WITH DIFFERENTIAL/PLATELET
Basophils Absolute: 0 K/uL (ref 0.0–0.1)
Basophils Relative: 0.7 % (ref 0.0–3.0)
Eosinophils Absolute: 0.1 K/uL (ref 0.0–0.7)
Eosinophils Relative: 2.1 % (ref 0.0–5.0)
HCT: 41.4 % (ref 36.0–46.0)
Hemoglobin: 14 g/dL (ref 12.0–15.0)
Lymphocytes Relative: 14.6 % (ref 12.0–46.0)
Lymphs Abs: 0.8 K/uL (ref 0.7–4.0)
MCHC: 33.8 g/dL (ref 30.0–36.0)
MCV: 86.1 fl (ref 78.0–100.0)
Monocytes Absolute: 0.4 K/uL (ref 0.1–1.0)
Monocytes Relative: 7.1 % (ref 3.0–12.0)
Neutro Abs: 4.2 K/uL (ref 1.4–7.7)
Neutrophils Relative %: 75.5 % (ref 43.0–77.0)
Platelets: 315 K/uL (ref 150.0–400.0)
RBC: 4.81 Mil/uL (ref 3.87–5.11)
RDW: 14.1 % (ref 11.5–15.5)
WBC: 5.5 K/uL (ref 4.0–10.5)

## 2024-12-02 LAB — MICROALBUMIN / CREATININE URINE RATIO
Creatinine,U: 174.4 mg/dL
Microalb Creat Ratio: 23.3 mg/g (ref 0.0–30.0)
Microalb, Ur: 4.1 mg/dL — ABNORMAL HIGH (ref 0.0–1.9)

## 2024-12-02 LAB — BASIC METABOLIC PANEL WITH GFR
BUN: 29 mg/dL — ABNORMAL HIGH (ref 6–23)
CO2: 28 meq/L (ref 19–32)
Calcium: 10.2 mg/dL (ref 8.4–10.5)
Chloride: 105 meq/L (ref 96–112)
Creatinine, Ser: 1.07 mg/dL (ref 0.40–1.20)
GFR: 48.87 mL/min — ABNORMAL LOW (ref >60.00)
Glucose, Bld: 98 mg/dL (ref 70–99)
Potassium: 3.9 meq/L (ref 3.5–5.1)
Sodium: 141 meq/L (ref 135–145)

## 2024-12-02 LAB — URINALYSIS, ROUTINE W REFLEX MICROSCOPIC
Bilirubin Urine: NEGATIVE
Hgb urine dipstick: NEGATIVE
Leukocytes,Ua: NEGATIVE
Nitrite: NEGATIVE
RBC / HPF: NONE SEEN (ref 0–?)
Specific Gravity, Urine: 1.02 (ref 1.000–1.030)
Total Protein, Urine: NEGATIVE
Urine Glucose: NEGATIVE
Urobilinogen, UA: 0.2 (ref 0.0–1.0)
pH: 6 (ref 5.0–8.0)

## 2024-12-02 LAB — TSH: TSH: 1.2 u[IU]/mL (ref 0.35–5.50)

## 2024-12-02 LAB — HEPATIC FUNCTION PANEL
ALT: 17 U/L (ref 0–35)
AST: 19 U/L (ref 0–37)
Albumin: 4.5 g/dL (ref 3.5–5.2)
Alkaline Phosphatase: 71 U/L (ref 39–117)
Bilirubin, Direct: 0.1 mg/dL (ref 0.0–0.3)
Total Bilirubin: 0.5 mg/dL (ref 0.2–1.2)
Total Protein: 7.1 g/dL (ref 6.0–8.3)

## 2024-12-02 LAB — HEMOGLOBIN A1C: Hgb A1c MFr Bld: 5.8 % (ref 4.6–6.5)

## 2024-12-02 MED ORDER — DAPAGLIFLOZIN PROPANEDIOL 10 MG PO TABS: 10.0000 mg | ORAL_TABLET | Freq: Every day | ORAL | 1 refills | Status: AC

## 2024-12-02 NOTE — Patient Instructions (Signed)

## 2024-12-02 NOTE — Progress Notes (Signed)
 Subjective:  Patient ID: Natalie Shannon, female    DOB: 1941-08-16  Age: 83 y.o. MRN: [id number]  CC: Hypertension and Hypothyroidism   HPI Natalie Shannon presents for f/up ---  Discussed the use of AI scribe software for clinical note transcription with the patient, who gave verbal consent to proceed.  History of Present Illness Natalie PEDRAZA is an 83 year old female who presents for follow-up regarding a scheduled echocardiogram.  She is scheduled for an echocardiogram and has concerns about the procedure involving a tube being placed down her throat.  No current symptoms of chest pain, shortness of breath, dizziness, or lightheadedness. She remains active and feels fine during physical activities.  Her past medical history includes asthma and allergies, for which she has received shots intermittently over the past 30 to 40 years. She has a deviated septum on the left side, noted by her allergist, which no longer bothers her.  She has a history of oral surgery for impacted teeth, which involved braces for five years during adolescence. A hiatal hernia was identified during this time but has never caused symptoms or trouble swallowing.  She has received a flu shot but has not had a recent COVID vaccine or tetanus shot.   Outpatient Medications Prior to Visit  Medication Sig Dispense Refill   Albuterol -Budesonide  (AIRSUPRA ) 90-80 MCG/ACT AERO Inhale 2 puffs into the lungs 4 (four) times daily as needed. 32.1 g 1   atorvastatin  (LIPITOR) 10 MG tablet TAKE ONE TABLET BY MOUTH DAILY 90 tablet 1   augmented betamethasone dipropionate (DIPROLENE-AF) 0.05 % cream Apply topically daily.     budesonide  (PULMICORT  FLEXHALER) 180 MCG/ACT inhaler Inhale 2 puffs into the lungs 2 (two) times daily. 6 each 0   cycloSPORINE (RESTASIS) 0.05 % ophthalmic emulsion INSTILL ONE DROP IN EACH EYE TWICE DAILY     desonide (DESOWEN) 0.05 % ointment APPLY TOPICALLY TO THE AFFECTED AREA TWICE DAILY  FOR 14 DAYS. 15 g 2   famotidine  (PEPCID ) 40 MG tablet TAKE ONE TABLET BY MOUTH DAILY 90 tablet 1   KERENDIA  20 MG TABS TAKE 1 TABLET EVERY DAY 90 tablet 3   losartan  (COZAAR ) 100 MG tablet TAKE ONE TABLET BY MOUTH DAILY 90 tablet 1   metroNIDAZOLE  (METROCREAM ) 0.75 % cream Apply topically 2 (two) times daily. 45 g 2   montelukast  (SINGULAIR ) 10 MG tablet TAKE ONE TABLET BY MOUTH AT BEDTIME 90 tablet 1   levothyroxine  (SYNTHROID ) 75 MCG tablet Take 1 tablet (75 mcg total) by mouth daily. 90 tablet 1   No facility-administered medications prior to visit.    ROS Review of Systems  Constitutional:  Negative for appetite change, chills, diaphoresis, fatigue and fever.  HENT: Negative.    Eyes: Negative.   Respiratory:  Negative for cough, chest tightness, shortness of breath and wheezing.   Cardiovascular:  Negative for chest pain, palpitations and leg swelling.  Gastrointestinal: Negative.  Negative for abdominal pain, constipation, diarrhea, nausea and vomiting.  Endocrine: Negative.  Negative for cold intolerance and heat intolerance.  Genitourinary: Negative.  Negative for difficulty urinating.  Musculoskeletal: Negative.   Skin: Negative.   Neurological: Negative.  Negative for dizziness and light-headedness.  Hematological:  Negative for adenopathy. Does not bruise/bleed easily.  Psychiatric/Behavioral:  Positive for confusion and decreased concentration.     Objective:  BP (!) 148/80 (BP Location: Left Arm, Patient Position: Sitting, Cuff Size: Normal)   Pulse 86   Temp 97.9 F (36.6 C) (Oral)  Ht 5' 5 (1.651 m)   Wt 137 lb (62.1 kg)   SpO2 95%   BMI 22.80 kg/m   BP Readings from Last 3 Encounters:  12/02/24 (!) 148/80  07/07/24 130/70  06/29/24 (!) 146/72    Wt Readings from Last 3 Encounters:  12/02/24 137 lb (62.1 kg)  07/07/24 145 lb (65.8 kg)  06/29/24 146 lb 8 oz (66.5 kg)    Physical Exam Vitals reviewed.  Constitutional:      Appearance: Normal  appearance.  HENT:     Mouth/Throat:     Mouth: Mucous membranes are moist.  Eyes:     General: No scleral icterus.    Conjunctiva/sclera: Conjunctivae normal.  Cardiovascular:     Rate and Rhythm: Normal rate and regular rhythm.     Heart sounds: No murmur heard.    No friction rub. No gallop.  Pulmonary:     Effort: Pulmonary effort is normal.     Breath sounds: No stridor. No wheezing, rhonchi or rales.  Abdominal:     General: Abdomen is flat.     Palpations: There is no mass.     Tenderness: There is no abdominal tenderness. There is no guarding.     Hernia: No hernia is present.  Musculoskeletal:        General: Normal range of motion.     Cervical back: Neck supple.     Right lower leg: No edema.     Left lower leg: No edema.  Lymphadenopathy:     Cervical: No cervical adenopathy.  Skin:    General: Skin is warm and dry.  Neurological:     General: No focal deficit present.     Mental Status: She is alert.  Psychiatric:        Mood and Affect: Mood normal.        Behavior: Behavior normal.     Lab Results  Component Value Date   WBC 5.5 12/02/2024   HGB 14.0 12/02/2024   HCT 41.4 12/02/2024   PLT 315.0 12/02/2024   GLUCOSE 98 12/02/2024   CHOL 161 06/02/2024   TRIG 156.0 (H) 06/02/2024   HDL 50.00 06/02/2024   LDLDIRECT 88.0 06/19/2023   LDLCALC 79 06/02/2024   ALT 17 12/02/2024   AST 19 12/02/2024   NA 141 12/02/2024   K 3.9 12/02/2024   CL 105 12/02/2024   CREATININE 1.07 12/02/2024   BUN 29 (H) 12/02/2024   CO2 28 12/02/2024   TSH 1.20 12/02/2024   INR 1.01 05/15/2012   HGBA1C 5.8 12/02/2024   MICROALBUR 4.1 (H) 12/02/2024   Estimated Creatinine Clearance: 37.1 mL/min (by C-G formula based on SCr of 1.07 mg/dL).   Assessment & Plan:   Acquired hypothyroidism- She is euthyroid. -     TSH; Future -     Levothyroxine  Sodium; Take 1 tablet (75 mcg total) by mouth daily.  Dispense: 90 tablet; Refill: 1  Type 2 diabetes mellitus with stage 3b  chronic kidney disease, without long-term current use of insulin (HCC) -     Basic metabolic panel with GFR; Future -     Hemoglobin A1c; Future -     Microalbumin / creatinine urine ratio; Future -     Urinalysis, Routine w reflex microscopic; Future -     Dapagliflozin  Propanediol; Take 1 tablet (10 mg total) by mouth daily.  Dispense: 90 tablet; Refill: 1 -     HM Diabetes Foot Exam  Stage 3b chronic kidney disease- Will add  a GLP-1 agonist. -     Basic metabolic panel with GFR; Future -     Microalbumin / creatinine urine ratio; Future -     Urinalysis, Routine w reflex microscopic; Future -     Dapagliflozin  Propanediol; Take 1 tablet (10 mg total) by mouth daily.  Dispense: 90 tablet; Refill: 1  Essential hypertension -     Basic metabolic panel with GFR; Future  Gastroesophageal reflux disease without esophagitis -     CBC with Differential/Platelet; Future  Hyperlipidemia LDL goal <130 -     Hepatic function panel; Future     Follow-up: Return in about 6 months (around 06/02/2025).  Debby Molt, MD

## 2024-12-03 ENCOUNTER — Ambulatory Visit: Payer: Self-pay | Admitting: Internal Medicine

## 2024-12-03 MED ORDER — LEVOTHYROXINE SODIUM 75 MCG PO TABS: 75.0000 ug | ORAL_TABLET | Freq: Every day | ORAL | 1 refills | Status: AC

## 2024-12-08 ENCOUNTER — Encounter (HOSPITAL_COMMUNITY): Payer: Self-pay

## 2024-12-08 ENCOUNTER — Ambulatory Visit: Payer: Self-pay

## 2024-12-08 ENCOUNTER — Other Ambulatory Visit: Payer: Self-pay

## 2024-12-08 ENCOUNTER — Emergency Department (HOSPITAL_COMMUNITY)
Admission: EM | Admit: 2024-12-08 | Discharge: 2024-12-09 | Disposition: A | Discharge status: Home / Self Care | Attending: Emergency Medicine | Admitting: Emergency Medicine

## 2024-12-08 ENCOUNTER — Emergency Department (HOSPITAL_COMMUNITY)

## 2024-12-08 DIAGNOSIS — R0789 Other chest pain: Secondary | ICD-10-CM | POA: Diagnosis not present

## 2024-12-08 DIAGNOSIS — Z91040 Latex allergy status: Secondary | ICD-10-CM | POA: Insufficient documentation

## 2024-12-08 DIAGNOSIS — R002 Palpitations: Secondary | ICD-10-CM | POA: Diagnosis present

## 2024-12-08 DIAGNOSIS — R42 Dizziness and giddiness: Secondary | ICD-10-CM | POA: Diagnosis not present

## 2024-12-08 LAB — COMPREHENSIVE METABOLIC PANEL WITH GFR
ALT: 19 U/L (ref 0–44)
AST: 22 U/L (ref 15–41)
Albumin: 4 g/dL (ref 3.5–5.0)
Alkaline Phosphatase: 72 U/L (ref 38–126)
Anion gap: 13 (ref 5–15)
BUN: 27 mg/dL — ABNORMAL HIGH (ref 8–23)
CO2: 22 mmol/L (ref 22–32)
Calcium: 9.7 mg/dL (ref 8.9–10.3)
Chloride: 104 mmol/L (ref 98–111)
Creatinine, Ser: 1.15 mg/dL — ABNORMAL HIGH (ref 0.44–1.00)
GFR, Estimated: 48 mL/min — ABNORMAL LOW (ref >60)
Glucose, Bld: 107 mg/dL — ABNORMAL HIGH (ref 70–99)
Potassium: 3.8 mmol/L (ref 3.5–5.1)
Sodium: 139 mmol/L (ref 135–145)
Total Bilirubin: 0.7 mg/dL (ref 0.0–1.2)
Total Protein: 6.9 g/dL (ref 6.5–8.1)

## 2024-12-08 LAB — CBC
HCT: 44.5 % (ref 36.0–46.0)
Hemoglobin: 14.8 g/dL (ref 12.0–15.0)
MCH: 29.7 pg (ref 26.0–34.0)
MCHC: 33.3 g/dL (ref 30.0–36.0)
MCV: 89.4 fL (ref 80.0–100.0)
Platelets: 335 K/uL (ref 150–400)
RBC: 4.98 MIL/uL (ref 3.87–5.11)
RDW: 13.1 % (ref 11.5–15.5)
WBC: 7.7 K/uL (ref 4.0–10.5)
nRBC: 0 % (ref 0.0–0.2)

## 2024-12-08 LAB — TROPONIN I (HIGH SENSITIVITY)
Troponin I (High Sensitivity): 4 ng/L (ref <18)
Troponin I (High Sensitivity): 5 ng/L (ref <18)

## 2024-12-08 MED ORDER — ALUM & MAG HYDROXIDE-SIMETH 200-200-20 MG/5ML PO SUSP
30.0000 mL | Freq: Once | ORAL | Status: AC
  Administered 2024-12-09: 30 mL via ORAL
  Filled 2024-12-08: qty 30

## 2024-12-08 NOTE — ED Notes (Signed)
 Extra urine sent to main lab

## 2024-12-08 NOTE — ED Provider Triage Note (Signed)
 Emergency Medicine Provider Triage Evaluation Note  Natalie Shannon , a 83 y.o. female  was evaluated in triage.  Pt complains of chest tightness with associated dizziness and nausea that started this morning after taking her Farxiga  pill.  No abdominal pain or vomiting.  No significant headache, extremity weakness or numbness.  Review of Systems  Positive:  Negative:   Physical Exam  BP (!) 153/74 (BP Location: Right Arm)   Pulse 71   Temp 97.9 F (36.6 C) (Oral)   Resp 14   SpO2 97%  Gen:   Awake, no distress   Resp:  Normal effort  MSK:   Moves extremities without difficulty  Other:  Abdomen nontender  Medical Decision Making  Medically screening exam initiated at 4:35 PM.  Appropriate orders placed.  Natalie Shannon was informed that the remainder of the evaluation will be completed by another provider, this initial triage assessment does not replace that evaluation, and the importance of remaining in the ED until their evaluation is complete.     Natalie Lynwood DEL, PA-C 12/08/24 1636

## 2024-12-08 NOTE — ED Triage Notes (Addendum)
 Denies having chest pain at the moment states she has some chest tightness this morning. She said she ate and felt dizzy and nauseous after she ate her breakfast.   New medication was for her CKD- Farxiga 

## 2024-12-08 NOTE — ED Triage Notes (Signed)
 Pt to er, pt states that her doctor gave her some new medicine and now it is giving her some palpitations.  States that he told her to come over here.  Denies pain, states that it feels like it is fluttering.  Reps even and unlabored

## 2024-12-08 NOTE — Telephone Encounter (Signed)
 FYI Only or Action Required?: FYI only for provider: ED advised and offered call 911, pt declines.  Patient was last seen in primary care on 12/02/2024 by Joshua Debby CROME, MD.  Called Nurse Triage reporting Chest Pain.  Symptoms began today.  Interventions attempted: Nothing.  Symptoms are: unchanged.  Triage Disposition: Call EMS 911 Now  Patient/caregiver understands and will follow disposition?: No, wishes to speak with PCP   Copied from CRM #[id number]. Topic: Clinical - Red Word Triage >> Dec 08, 2024 12:35 PM Drema MATSU wrote: Red Word that prompted transfer to Nurse Triage: Patient stated that dapagliflozin  propanediol (FARXIGA ) 10 MG TABS tablet  that was just given to her is making her sick. Symptoms: gassy, dizziness, and pain *has been going on since 7:00am she was so sick that she couldn't even eat her breakfast. Reason for Disposition  [1] Chest pain lasts > 5 minutes AND [2] age > 29  Answer Assessment - Initial Assessment Questions Advised 911 now. Offered to call, patient declines and reports have to care for dog and get dressed. Patient reports will call 911 or see if transportation could take to ED. Nurse again offered to call 911, patient reports no one will be able to care for dog and needs to get dressed.  Advised 911 if symptoms worsen.  1. LOCATION: Where does it hurt?       Chest tightness,sob, breast, neck; left side; not pain just tightness. Patient reports lightheadedness, dizziness, belching, passing gas, since 0700. I just started feeling better about 40 minutes ago and I thought I would call  3. ONSET: When did the chest pain begin? (Minutes, hours or days)      0700 this morning; 5 minutes after taking Farxiga  4. PATTERN: Does the pain come and go, or has it been constant since it started?  Does it get worse with exertion?      ongoing 5. DURATION: How long does it last (e.g., seconds, minutes, hours)     0700 6. SEVERITY: How bad is  the pain?  (e.g., Scale 1-10; mild, moderate, or severe)     Not pain, just tightnness Denies faint, diff breathing Able to keep down liquids  Protocols used: Chest Pain-A-AH

## 2024-12-08 NOTE — ED Provider Notes (Signed)
 [place] EMERGENCY DEPARTMENT AT [facility] Provider Note   CSN: [id number] Arrival date & time: 12/08/24  1549     Patient presents with: Palpitations   Natalie Shannon is a 83 y.o. female.   Patient presents to the emergency department for evaluation of dizziness and heart palpitations.  She reports that she took her first dose of Farxiga  this morning and shortly afterwards started having symptoms of dizziness and then feeling her heart pounding.  Patient then recalled that years ago she had tried this medication and had the same reaction.  She had forgotten about this reaction when her doctor restarted this medication.  Patient reports that she is feeling better.  She does have some chest discomfort that feels like gas and reflux.       Prior to Admission medications   Medication Sig Start Date End Date Taking? Authorizing Provider  Albuterol -Budesonide  (AIRSUPRA ) 90-80 MCG/ACT AERO Inhale 2 puffs into the lungs 4 (four) times daily as needed. 06/02/24   Joshua Debby CROME, MD  atorvastatin  (LIPITOR) 10 MG tablet TAKE ONE TABLET BY MOUTH DAILY 11/29/24   Joshua Debby CROME, MD  augmented betamethasone dipropionate (DIPROLENE-AF) 0.05 % cream Apply topically daily.    [provider]  budesonide  (PULMICORT  FLEXHALER) 180 MCG/ACT inhaler Inhale 2 puffs into the lungs 2 (two) times daily. 09/01/24   Joshua Debby CROME, MD  cycloSPORINE (RESTASIS) 0.05 % ophthalmic emulsion INSTILL ONE DROP IN EACH EYE TWICE DAILY    [provider]  dapagliflozin  propanediol (FARXIGA ) 10 MG TABS tablet Take 1 tablet (10 mg total) by mouth daily. 12/02/24   Joshua Debby CROME, MD  desonide (DESOWEN) 0.05 % ointment APPLY TOPICALLY TO THE AFFECTED AREA TWICE DAILY FOR 14 DAYS. 12/06/21   Joshua Debby CROME, MD  famotidine  (PEPCID ) 40 MG tablet TAKE ONE TABLET BY MOUTH DAILY 11/29/24   Joshua Debby CROME, MD  KERENDIA  20 MG TABS TAKE 1 TABLET EVERY DAY 01/29/24   Webb, Padonda B, FNP   levothyroxine  (SYNTHROID ) 75 MCG tablet Take 1 tablet (75 mcg total) by mouth daily. 12/03/24   Joshua Debby CROME, MD  losartan  (COZAAR ) 100 MG tablet TAKE ONE TABLET BY MOUTH DAILY 11/29/24   Joshua Debby CROME, MD  metroNIDAZOLE  (METROCREAM ) 0.75 % cream Apply topically 2 (two) times daily. 05/12/23   Joshua Debby CROME, MD  montelukast  (SINGULAIR ) 10 MG tablet TAKE ONE TABLET BY MOUTH AT BEDTIME 11/29/24   Joshua Debby CROME, MD    Allergies: Latex, Povidone-iodine, Simvastatin , Sulfonamide derivatives, Azithromycin , and Doxycycline     Review of Systems  Updated Vital Signs BP (!) 166/88   Pulse 60   Temp (!) 97.5 F (36.4 C)   Resp 20   SpO2 100%   Physical Exam Vitals and nursing note reviewed.  Constitutional:      General: She is not in acute distress.    Appearance: She is well-developed.  HENT:     Head: Normocephalic and atraumatic.     Mouth/Throat:     Mouth: Mucous membranes are moist.  Eyes:     General: Vision grossly intact. Gaze aligned appropriately.     Extraocular Movements: Extraocular movements intact.     Conjunctiva/sclera: Conjunctivae normal.  Cardiovascular:     Rate and Rhythm: Normal rate and regular rhythm.     Pulses: Normal pulses.     Heart sounds: Normal heart sounds, S1 normal and S2 normal. No murmur heard.    No friction rub. No gallop.  Pulmonary:  Effort: Pulmonary effort is normal. No respiratory distress.     Breath sounds: Normal breath sounds.  Abdominal:     General: Bowel sounds are normal.     Palpations: Abdomen is soft.     Tenderness: There is no abdominal tenderness. There is no guarding or rebound.     Hernia: No hernia is present.  Musculoskeletal:        General: No swelling.     Cervical back: Full passive range of motion without pain, normal range of motion and neck supple. No spinous process tenderness or muscular tenderness. Normal range of motion.     Right lower leg: No edema.     Left lower leg: No edema.  Skin:     General: Skin is warm and dry.     Capillary Refill: Capillary refill takes less than 2 seconds.     Findings: No ecchymosis, erythema, rash or wound.  Neurological:     General: No focal deficit present.     Mental Status: She is alert and oriented to person, place, and time.     GCS: GCS eye subscore is 4. GCS verbal subscore is 5. GCS motor subscore is 6.     Cranial Nerves: Cranial nerves 2-12 are intact.     Sensory: Sensation is intact.     Motor: Motor function is intact.     Coordination: Coordination is intact.  Psychiatric:        Attention and Perception: Attention normal.        Mood and Affect: Mood normal.        Speech: Speech normal.        Behavior: Behavior normal.     (all labs ordered are listed, but only abnormal results are displayed) Labs Reviewed  COMPREHENSIVE METABOLIC PANEL WITH GFR - Abnormal; Notable for the following components:      Result Value   Glucose, Bld 107 (*)    BUN 27 (*)    Creatinine, Ser 1.15 (*)    GFR, Estimated 48 (*)    All other components within normal limits  CBC  TROPONIN I (HIGH SENSITIVITY)  TROPONIN I (HIGH SENSITIVITY)    EKG: EKG Interpretation Date/Time:  Wednesday December 08 2024 16:31:26 EST Ventricular Rate:  69 PR Interval:  158 QRS Duration:  68 QT Interval:  382 QTC Calculation: 409 R Axis:   28  Text Interpretation: Normal sinus rhythm Cannot rule out Anterior infarct , age undetermined Abnormal ECG When compared with ECG of 15-May-2012 21:26, No acute changes Confirmed by Haze Lonni PARAS [phone]) on 12/08/2024 11:07:04 PM  Radiology: [facility] Chest 2 View Result Date: 12/08/2024 CLINICAL DATA:  Palpitations EXAM: CHEST - 2 VIEW COMPARISON:  03/12/2013 FINDINGS: Frontal and lateral views of the chest demonstrate an unremarkable cardiac silhouette. No acute airspace disease, effusion, or pneumothorax. Prior healed bilateral rib fractures. No acute bony abnormalities. IMPRESSION: 1. No acute intrathoracic  process. Electronically Signed   By: Ozell Daring M.D.   On: 12/08/2024 17:31     Procedures   Medications Ordered in the ED  alum & mag hydroxide-simeth (MAALOX/MYLANTA) 200-200-20 MG/5ML suspension 30 mL (has no administration in time range)                                    Medical Decision Making Amount and/or Complexity of Data Reviewed External Data Reviewed: labs, ECG and notes. Labs: ordered. Decision-making details  documented in ED Course. Radiology: ordered and independent interpretation performed. Decision-making details documented in ED Course. ECG/medicine tests: ordered and independent interpretation performed. Decision-making details documented in ED Course.  Risk OTC drugs.   Differential diagnosis considered includes, but not limited to: TIA; Stroke; ICH; Seizure; electrolyte abnormality; hypoglycemia; toxic/pharmacologic causes; NSTEMI; STEMI; GERD  Presents to the emergency department with complaints of dizziness, heart palpitations with a tightness in her chest.  Symptoms began after taking Farxiga .  She reports that her symptoms are improved although she still has some gas feelings in her chest.  Patient is certain that she took Farxiga  many years ago and had a very similar reaction.  Her neurologic exam is normal.  As her symptoms are improving, suspect adverse reaction to the Farxiga .  Her workup has been reassuring including EKG and serial troponins.  Patient reports that she is going to stop the Farxiga , she will follow-up with her doctor for further instructions.  She is appropriate for discharge.     Final diagnoses:  Palpitations  Dizziness    ED Discharge Orders     None          Dayn Barich, Lonni PARAS, MD 12/08/24 2317

## 2024-12-10 ENCOUNTER — Encounter: Payer: Self-pay | Admitting: Internal Medicine

## 2025-01-14 ENCOUNTER — Ambulatory Visit (HOSPITAL_COMMUNITY)
Admission: RE | Admit: 2025-01-14 | Discharge: 2025-01-14 | Disposition: A | Discharge status: Home / Self Care | Source: Ambulatory Visit | Attending: Physician Assistant | Admitting: Physician Assistant

## 2025-01-14 DIAGNOSIS — R0602 Shortness of breath: Secondary | ICD-10-CM | POA: Diagnosis present

## 2025-01-14 DIAGNOSIS — R0683 Snoring: Secondary | ICD-10-CM | POA: Insufficient documentation

## 2025-01-14 DIAGNOSIS — I081 Rheumatic disorders of both mitral and tricuspid valves: Secondary | ICD-10-CM | POA: Diagnosis not present

## 2025-01-14 DIAGNOSIS — I471 Supraventricular tachycardia, unspecified: Secondary | ICD-10-CM | POA: Diagnosis not present

## 2025-01-14 LAB — ECHOCARDIOGRAM COMPLETE
Area-P 1/2: 2.1 cm2
S' Lateral: 2.5 cm

## 2025-01-17 ENCOUNTER — Encounter: Payer: Self-pay | Admitting: Physician Assistant

## 2025-01-17 ENCOUNTER — Ambulatory Visit: Payer: Self-pay | Admitting: Physician Assistant

## 2025-01-17 DIAGNOSIS — I340 Nonrheumatic mitral (valve) insufficiency: Secondary | ICD-10-CM | POA: Insufficient documentation

## 2025-01-18 NOTE — Progress Notes (Signed)
 Spoke with patient regarding echo. Put in recall for patient to receive in Nov 2026 for 1 yr f/u. Asked for copy of echo results, mailed today.

## 2025-02-07 ENCOUNTER — Ambulatory Visit: Payer: Medicare PPO

## 2025-02-08 ENCOUNTER — Ambulatory Visit
